# Patient Record
Sex: Female | Born: 1995 | Race: White | Hispanic: No | Marital: Single | Smoking: Never smoker
Health system: Southern US, Community
[De-identification: ages and names within clinical notes are randomized; demographics above are authoritative.]

## PROBLEM LIST (undated history)

## (undated) DIAGNOSIS — S83242A Other tear of medial meniscus, current injury, left knee, initial encounter: Secondary | ICD-10-CM

## (undated) DIAGNOSIS — K8050 Calculus of bile duct without cholangitis or cholecystitis without obstruction: Secondary | ICD-10-CM

## (undated) DIAGNOSIS — I10 Essential (primary) hypertension: Secondary | ICD-10-CM

## (undated) DIAGNOSIS — F32A Depression, unspecified: Secondary | ICD-10-CM

## (undated) DIAGNOSIS — S83282A Other tear of lateral meniscus, current injury, left knee, initial encounter: Secondary | ICD-10-CM

## (undated) DIAGNOSIS — N200 Calculus of kidney: Secondary | ICD-10-CM

## (undated) DIAGNOSIS — F329 Major depressive disorder, single episode, unspecified: Secondary | ICD-10-CM

## (undated) DIAGNOSIS — S060X9A Concussion with loss of consciousness of unspecified duration, initial encounter: Secondary | ICD-10-CM

## (undated) HISTORY — PX: TONSILLECTOMY: SUR1361

---
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
## 2014-01-31 ENCOUNTER — Encounter (HOSPITAL_BASED_OUTPATIENT_CLINIC_OR_DEPARTMENT_OTHER): Payer: Self-pay | Admitting: *Deleted

## 2014-02-01 ENCOUNTER — Other Ambulatory Visit: Payer: Self-pay | Admitting: Orthopedic Surgery

## 2014-02-02 ENCOUNTER — Encounter (HOSPITAL_BASED_OUTPATIENT_CLINIC_OR_DEPARTMENT_OTHER): Payer: BC Managed Care – PPO | Admitting: Anesthesiology

## 2014-02-02 ENCOUNTER — Ambulatory Visit (HOSPITAL_BASED_OUTPATIENT_CLINIC_OR_DEPARTMENT_OTHER)
Admission: RE | Admit: 2014-02-02 | Discharge: 2014-02-02 | Disposition: A | Discharge status: Home / Self Care | Payer: BC Managed Care – PPO | Source: Ambulatory Visit | Attending: Orthopedic Surgery | Admitting: Orthopedic Surgery

## 2014-02-02 ENCOUNTER — Ambulatory Visit (HOSPITAL_BASED_OUTPATIENT_CLINIC_OR_DEPARTMENT_OTHER): Payer: BC Managed Care – PPO | Admitting: Anesthesiology

## 2014-02-02 ENCOUNTER — Encounter (HOSPITAL_BASED_OUTPATIENT_CLINIC_OR_DEPARTMENT_OTHER): Payer: Self-pay | Admitting: *Deleted

## 2014-02-02 ENCOUNTER — Encounter (HOSPITAL_BASED_OUTPATIENT_CLINIC_OR_DEPARTMENT_OTHER)
Admission: RE | Disposition: A | Discharge status: Home / Self Care | Payer: Self-pay | Source: Ambulatory Visit | Attending: Orthopedic Surgery

## 2014-02-02 DIAGNOSIS — S83289A Other tear of lateral meniscus, current injury, unspecified knee, initial encounter: Secondary | ICD-10-CM | POA: Diagnosis present

## 2014-02-02 DIAGNOSIS — Y929 Unspecified place or not applicable: Secondary | ICD-10-CM | POA: Diagnosis not present

## 2014-02-02 DIAGNOSIS — S83282A Other tear of lateral meniscus, current injury, left knee, initial encounter: Secondary | ICD-10-CM | POA: Diagnosis present

## 2014-02-02 DIAGNOSIS — X58XXXA Exposure to other specified factors, initial encounter: Secondary | ICD-10-CM | POA: Diagnosis not present

## 2014-02-02 HISTORY — DX: Other tear of medial meniscus, current injury, left knee, initial encounter: S83.242A

## 2014-02-02 HISTORY — PX: KNEE ARTHROSCOPY WITH LATERAL MENISECTOMY: SHX6193

## 2014-02-02 HISTORY — DX: Concussion with loss of consciousness of unspecified duration, initial encounter: S06.0X9A

## 2014-02-02 HISTORY — DX: Other tear of lateral meniscus, current injury, left knee, initial encounter: S83.282A

## 2014-02-02 LAB — POCT HEMOGLOBIN-HEMACUE: HEMOGLOBIN: 13 g/dL (ref 12.0–15.0)

## 2014-02-02 SURGERY — ARTHROSCOPY, KNEE, WITH LATERAL MENISCECTOMY
Anesthesia: General | Site: Knee | Laterality: Left

## 2014-02-02 MED ORDER — ONDANSETRON HCL 4 MG PO TABS: 4.0000 mg | ORAL_TABLET | Freq: Three times a day (TID) | ORAL | Status: DC | PRN

## 2014-02-02 MED ORDER — PROPOFOL 10 MG/ML IV BOLUS
INTRAVENOUS | Status: DC | PRN
  Administered 2014-02-02: 400 mg via INTRAVENOUS

## 2014-02-02 MED ORDER — BUPIVACAINE-EPINEPHRINE (PF) 0.5% -1:200000 IJ SOLN
INTRAMUSCULAR | Status: AC
  Filled 2014-02-02: qty 30

## 2014-02-02 MED ORDER — ONDANSETRON HCL 4 MG/2ML IJ SOLN
INTRAMUSCULAR | Status: AC
  Filled 2014-02-02: qty 2

## 2014-02-02 MED ORDER — SCOPOLAMINE 1 MG/3DAYS TD PT72
1.0000 | MEDICATED_PATCH | TRANSDERMAL | Status: DC
  Administered 2014-02-02: 1 via TRANSDERMAL
  Administered 2014-02-02: 1.5 mg via TRANSDERMAL

## 2014-02-02 MED ORDER — FENTANYL CITRATE 0.05 MG/ML IJ SOLN: 50.0000 ug | INTRAMUSCULAR | Status: DC | PRN

## 2014-02-02 MED ORDER — SCOPOLAMINE 1 MG/3DAYS TD PT72
MEDICATED_PATCH | TRANSDERMAL | Status: AC
Start: 2014-02-02 — End: 2014-02-02
  Filled 2014-02-02: qty 1

## 2014-02-02 MED ORDER — OXYCODONE-ACETAMINOPHEN 5-325 MG PO TABS: 1.0000 | ORAL_TABLET | Freq: Four times a day (QID) | ORAL | Status: DC | PRN

## 2014-02-02 MED ORDER — HYDROMORPHONE HCL PF 1 MG/ML IJ SOLN
INTRAMUSCULAR | Status: AC
  Filled 2014-02-02: qty 1

## 2014-02-02 MED ORDER — OXYCODONE HCL 5 MG PO TABS: 5.0000 mg | ORAL_TABLET | Freq: Once | ORAL | Status: DC | PRN

## 2014-02-02 MED ORDER — HYDROMORPHONE HCL PF 1 MG/ML IJ SOLN
0.2500 mg | INTRAMUSCULAR | Status: DC | PRN
  Administered 2014-02-02 (×2): 0.5 mg via INTRAVENOUS

## 2014-02-02 MED ORDER — SUFENTANIL CITRATE 50 MCG/ML IV SOLN
INTRAVENOUS | Status: AC
  Filled 2014-02-02: qty 1

## 2014-02-02 MED ORDER — DEXAMETHASONE SODIUM PHOSPHATE 4 MG/ML IJ SOLN
INTRAMUSCULAR | Status: DC | PRN
  Administered 2014-02-02: 10 mg via INTRAVENOUS

## 2014-02-02 MED ORDER — ONDANSETRON HCL 4 MG/2ML IJ SOLN
4.0000 mg | Freq: Once | INTRAMUSCULAR | Status: AC | PRN
  Administered 2014-02-02: 4 mg via INTRAVENOUS

## 2014-02-02 MED ORDER — LIDOCAINE HCL (CARDIAC) 20 MG/ML IV SOLN
INTRAVENOUS | Status: DC | PRN
  Administered 2014-02-02: 100 mg via INTRAVENOUS

## 2014-02-02 MED ORDER — SUFENTANIL CITRATE 50 MCG/ML IV SOLN
INTRAVENOUS | Status: DC | PRN
  Administered 2014-02-02: 5 ug via INTRAVENOUS
  Administered 2014-02-02: 10 ug via INTRAVENOUS

## 2014-02-02 MED ORDER — SUCCINYLCHOLINE CHLORIDE 20 MG/ML IJ SOLN
INTRAMUSCULAR | Status: AC
  Filled 2014-02-02: qty 1

## 2014-02-02 MED ORDER — SENNA-DOCUSATE SODIUM 8.6-50 MG PO TABS: 2.0000 | ORAL_TABLET | Freq: Every day | ORAL | Status: DC

## 2014-02-02 MED ORDER — KETOROLAC TROMETHAMINE 10 MG PO TABS: 10.0000 mg | ORAL_TABLET | Freq: Three times a day (TID) | ORAL | Status: DC

## 2014-02-02 MED ORDER — DEXTROSE 5 % IV SOLN: 3.0000 g | INTRAVENOUS | Status: DC

## 2014-02-02 MED ORDER — BUPIVACAINE HCL (PF) 0.5 % IJ SOLN
INTRAMUSCULAR | Status: DC | PRN
  Administered 2014-02-02: 20 mL

## 2014-02-02 MED ORDER — ONDANSETRON HCL 4 MG/2ML IJ SOLN
INTRAMUSCULAR | Status: DC | PRN
  Administered 2014-02-02: 4 mg via INTRAVENOUS

## 2014-02-02 MED ORDER — OXYCODONE HCL 5 MG/5ML PO SOLN: 5.0000 mg | Freq: Once | ORAL | Status: DC | PRN

## 2014-02-02 MED ORDER — MIDAZOLAM HCL 5 MG/5ML IJ SOLN
INTRAMUSCULAR | Status: DC | PRN
  Administered 2014-02-02: 2 mg via INTRAVENOUS

## 2014-02-02 MED ORDER — MIDAZOLAM HCL 2 MG/2ML IJ SOLN: 1.0000 mg | INTRAMUSCULAR | Status: DC | PRN

## 2014-02-02 MED ORDER — LACTATED RINGERS IV SOLN
INTRAVENOUS | Status: DC
  Administered 2014-02-02 (×2): via INTRAVENOUS

## 2014-02-02 MED ORDER — CEFAZOLIN SODIUM-DEXTROSE 2-3 GM-% IV SOLR
INTRAVENOUS | Status: AC
  Filled 2014-02-02: qty 50

## 2014-02-02 MED ORDER — CEFAZOLIN SODIUM 1-5 GM-% IV SOLN
INTRAVENOUS | Status: AC
  Filled 2014-02-02: qty 50

## 2014-02-02 MED ORDER — SODIUM CHLORIDE 0.9 % IR SOLN
Status: DC | PRN
  Administered 2014-02-02: 6000 mL

## 2014-02-02 MED ORDER — BUPIVACAINE HCL (PF) 0.5 % IJ SOLN
INTRAMUSCULAR | Status: AC
  Filled 2014-02-02: qty 30

## 2014-02-02 MED ORDER — MIDAZOLAM HCL 2 MG/2ML IJ SOLN
INTRAMUSCULAR | Status: AC
  Filled 2014-02-02: qty 2

## 2014-02-02 SURGICAL SUPPLY — 45 items
APL SKNCLS STERI-STRIP NONHPOA (GAUZE/BANDAGES/DRESSINGS) ×1
BANDAGE ELASTIC 6 VELCRO ST LF (GAUZE/BANDAGES/DRESSINGS) ×2 IMPLANT
BANDAGE ESMARK 6X9 LF (GAUZE/BANDAGES/DRESSINGS) IMPLANT
BENZOIN TINCTURE PRP APPL 2/3 (GAUZE/BANDAGES/DRESSINGS) ×2 IMPLANT
BLADE CUTTER GATOR 3.5 (BLADE) ×2 IMPLANT
BNDG CMPR 9X6 STRL LF SNTH (GAUZE/BANDAGES/DRESSINGS)
BNDG ESMARK 6X9 LF (GAUZE/BANDAGES/DRESSINGS)
CANISTER SUCT 3000ML (MISCELLANEOUS) IMPLANT
CUFF TOURNIQUET SINGLE 34IN LL (TOURNIQUET CUFF) IMPLANT
CUTTER KNOT PUSHER 2-0 FIBERWI (INSTRUMENTS) IMPLANT
CUTTER MENISCUS  4.2MM (BLADE)
CUTTER MENISCUS 4.2MM (BLADE) IMPLANT
DRAPE ARTHROSCOPY W/POUCH 90 (DRAPES) ×2 IMPLANT
DRAPE U 20/CS (DRAPES) ×2 IMPLANT
DURAPREP 26ML APPLICATOR (WOUND CARE) ×3 IMPLANT
ELECT MENISCUS 165MM 90D (ELECTRODE) IMPLANT
ELECT REM PT RETURN 9FT ADLT (ELECTROSURGICAL)
ELECTRODE REM PT RTRN 9FT ADLT (ELECTROSURGICAL) IMPLANT
GAUZE SPONGE 4X4 12PLY STRL (GAUZE/BANDAGES/DRESSINGS) ×2 IMPLANT
GLOVE BIO SURGEON STRL SZ8 (GLOVE) ×2 IMPLANT
GLOVE BIOGEL PI IND STRL 7.0 (GLOVE) IMPLANT
GLOVE BIOGEL PI IND STRL 8 (GLOVE) ×2 IMPLANT
GLOVE BIOGEL PI INDICATOR 7.0 (GLOVE) ×1
GLOVE BIOGEL PI INDICATOR 8 (GLOVE) ×2
GLOVE ECLIPSE 6.5 STRL STRAW (GLOVE) ×1 IMPLANT
GLOVE ORTHO TXT STRL SZ7.5 (GLOVE) ×2 IMPLANT
GOWN STRL REUS W/ TWL LRG LVL3 (GOWN DISPOSABLE) ×1 IMPLANT
GOWN STRL REUS W/ TWL XL LVL3 (GOWN DISPOSABLE) ×2 IMPLANT
GOWN STRL REUS W/TWL LRG LVL3 (GOWN DISPOSABLE) ×2
GOWN STRL REUS W/TWL XL LVL3 (GOWN DISPOSABLE) ×4
IV NS IRRIG 3000ML ARTHROMATIC (IV SOLUTION) ×4 IMPLANT
KNEE WRAP E Z 3 GEL PACK (MISCELLANEOUS) ×2 IMPLANT
MANIFOLD NEPTUNE II (INSTRUMENTS) ×2 IMPLANT
PACK ARTHROSCOPY DSU (CUSTOM PROCEDURE TRAY) ×2 IMPLANT
PACK BASIN DAY SURGERY FS (CUSTOM PROCEDURE TRAY) ×2 IMPLANT
PENCIL BUTTON HOLSTER BLD 10FT (ELECTRODE) IMPLANT
SET ARTHROSCOPY TUBING (MISCELLANEOUS) ×2
SET ARTHROSCOPY TUBING LN (MISCELLANEOUS) ×1 IMPLANT
SLEEVE SCD COMPRESS KNEE MED (MISCELLANEOUS) ×2 IMPLANT
STRIP CLOSURE SKIN 1/2X4 (GAUZE/BANDAGES/DRESSINGS) ×2 IMPLANT
SUT MNCRL AB 4-0 PS2 18 (SUTURE) ×2 IMPLANT
TOWEL OR 17X24 6PK STRL BLUE (TOWEL DISPOSABLE) ×2 IMPLANT
TOWEL OR NON WOVEN STRL DISP B (DISPOSABLE) ×2 IMPLANT
WAND STAR VAC 90 (SURGICAL WAND) IMPLANT
WATER STERILE IRR 1000ML POUR (IV SOLUTION) ×2 IMPLANT

## 2014-02-02 NOTE — CV Procedure (Signed)
02/02/2014  10:24 AM  PATIENT:  Rebecca Gardner    PRE-OPERATIVE DIAGNOSIS:  Left knee complex lateral meniscus tear  POST-OPERATIVE DIAGNOSIS:  Same  PROCEDURE:  LEFT KNEE ARTHROSCOPY WITH LATERAL MENISECTOMY  SURGEON:  Eulas Post, MD  PHYSICIAN ASSISTANT: Janace Litten, OPA-C, present and scrubbed throughout the case, critical for completion in a timely fashion, and for retraction, instrumentation, and closure.  ANESTHESIA:   General  PREOPERATIVE INDICATIONS:  Rebecca Gardner is a  18 y.o. female with a diagnosis of Left Knee: Tear Meniscus Lateral/Anthorn/Post Margaretmary Eddy who failed conservative measures and elected for surgical management.    The risks benefits and alternatives were discussed with the patient preoperatively including but not limited to the risks of infection, bleeding, nerve injury, cardiopulmonary complications, the need for revision surgery, among others, and the patient was willing to proceed.  OPERATIVE IMPLANTS: None  OPERATIVE FINDINGS: The anterior cruciate ligament and PCL the medial compartment and patellofemoral joint were all completely normal. The lateral meniscus had a complex tear involving the anterior horn extending around to the mid substance of the body. This was a cleavage-type tear, along with a radial component, leaving a large anterior stump that was flipped up into the notch, and the lateral compartment chondral integrity appeared intact.  OPERATIVE PROCEDURE: The patient is brought to the operating room and placed in supine position. General anesthesia was administered. IV antibiotics were given. The left lower extremity was prepped and draped in usual sterile fashion. Time out was performed. Inferomedial and inferolateral portals created, and diagnostic arthroscopy carried out the above-named findings.  I then used the arthroscopic shaver and the arthroscopic biter to debride the lateral meniscus back to a stable configuration. I removed a large  segment of the anterior horn using a grasper. I switched portals and confirmed appropriate contour of the meniscus, and then removed the instruments, and drained the knee and injected the knee with Marcaine and then closed the portals with Monocryl with Steri-Strips and sterile gauze. She was awakened and returned to the PACU in stable and satisfactory condition. There were no complications. \

## 2014-02-02 NOTE — H&P (Signed)
PREOPERATIVE H&P  Chief Complaint: Left Knee: Tear Meniscus Lateral/Anthorn/Post Margaretmary Eddy  HPI: Rebecca Gardner is a 18 y.o. female who presents for preoperative history and physical with a diagnosis of Left Knee: Tear Meniscus Lateral/Anthorn/Post Horn. Symptoms are rated as moderate to severe, and have been worsening.  This is significantly impairing activities of daily living.  She has elected for surgical management. She plays volleyball and has had locking and catching and swelling.   Past Medical History  Diagnosis Date  . Acute medial meniscus tear of left knee   . Concussion with brief (less than one hour) loss of consciousness 4-28 15   Past Surgical History  Procedure Laterality Date  . Tonsillectomy     History   Social History  . Marital Status: Single    Spouse Name: N/A    Number of Children: N/A  . Years of Education: N/A   Social History Main Topics  . Smoking status: Never Smoker   . Smokeless tobacco: None  . Alcohol Use: No  . Drug Use: No  . Sexual Activity: No   Other Topics Concern  . None   Social History Narrative  . None   History reviewed. No pertinent family history. No Known Allergies Prior to Admission medications   Not on File     Positive ROS: All other systems have been reviewed and were otherwise negative with the exception of those mentioned in the HPI and as above.  Physical Exam: General: Alert, no acute distress Cardiovascular: No pedal edema Respiratory: No cyanosis, no use of accessory musculature GI: No organomegaly, abdomen is soft and non-tender Skin: No lesions in the area of chief complaint Neurologic: Sensation intact distally Psychiatric: Patient is competent for consent with normal mood and affect Lymphatic: No axillary or cervical lymphadenopathy  MUSCULOSKELETAL: left knee with lateral joint line tenderness, positive pain to palpation  Assessment: Left Knee: Tear Meniscus Lateral/Anthorn/Post Horn  Plan: Plan  for Procedure(s): LEFT KNEE ARTHROSCOPY WITH LATERAL MENISECTOMY, LATERAL MENISCUS REPAIR  The risks benefits and alternatives were discussed with the patient including but not limited to the risks of nonoperative treatment, versus surgical intervention including infection, bleeding, nerve injury,  blood clots, cardiopulmonary complications, morbidity, mortality, among others, and they were willing to proceed.   Eulas Post, MD Cell [phone]   02/02/2014 9:09 AM

## 2014-02-02 NOTE — Anesthesia Procedure Notes (Signed)
Procedure Name: LMA Insertion Date/Time: 02/02/2014 9:33 AM Performed by: Zenia Resides D Pre-anesthesia Checklist: Patient identified, Emergency Drugs available, Suction available and Patient being monitored Patient Re-evaluated:Patient Re-evaluated prior to inductionOxygen Delivery Method: Circle System Utilized Preoxygenation: Pre-oxygenation with 100% oxygen Intubation Type: IV induction Ventilation: Mask ventilation without difficulty LMA: LMA inserted LMA Size: 4.0 Number of attempts: 1 Airway Equipment and Method: bite block Placement Confirmation: positive ETCO2 Tube secured with: Tape Dental Injury: Teeth and Oropharynx as per pre-operative assessment

## 2014-02-02 NOTE — Discharge Instructions (Signed)
Diet: As you were doing prior to hospitalization   Shower:  May shower but keep the wounds dry, use an occlusive plastic wrap, NO SOAKING IN TUB.  If the bandage gets wet, change with a clean dry gauze.  Dressing:  You may change your dressing 3-5 days after surgery.  Then change the dressing daily with sterile gauze dressing.    There are sticky tapes (steri-strips) on your wounds and all the stitches are absorbable.  Leave the steri-strips in place when changing your dressings, they will peel off with time, usually 2-3 weeks.  Activity:  Increase activity slowly as tolerated, but follow the weight bearing instructions below.  No lifting or driving for 6 weeks.  Weight Bearing:   As tolerated.    To prevent constipation: you may use a stool softener such as -  Colace (over the counter) 100 mg by mouth twice a day  Drink plenty of fluids (prune juice may be helpful) and high fiber foods Miralax (over the counter) for constipation as needed.    Itching:  If you experience itching with your medications, try taking only a single pain pill, or even half a pain pill at a time.  You may take up to 10 pain pills per day, and you can also use benadryl over the counter for itching or also to help with sleep.   Precautions:  If you experience chest pain or shortness of breath - call 911 immediately for transfer to the hospital emergency department!!  If you develop a fever greater that 101 F, purulent drainage from wound, increased redness or drainage from wound, or calf pain -- Call the office at [phone]                                                Follow- Up Appointment:  Please call for an appointment to be seen in 2 weeks [place] - [phone]      Post Anesthesia Home Care Instructions  Activity: Get plenty of rest for the remainder of the day. A responsible adult should stay with you for 24 hours following the procedure.  For the next 24 hours, DO NOT: -Drive a car -Social worker -Drink alcoholic beverages -Take any medication unless instructed by your physician -Make any legal decisions or sign important papers.  Meals: Start with liquid foods such as gelatin or soup. Progress to regular foods as tolerated. Avoid greasy, spicy, heavy foods. If nausea and/or vomiting occur, drink only clear liquids until the nausea and/or vomiting subsides. Call your physician if vomiting continues.  Special Instructions/Symptoms: Your throat may feel dry or sore from the anesthesia or the breathing tube placed in your throat during surgery. If this causes discomfort, gargle with warm salt water. The discomfort should disappear within 24 hours.  Cast or Splint Care Casts and splints support injured limbs and keep bones from moving while they heal. It is important to care for your cast or splint at home.  HOME CARE INSTRUCTIONS  Keep the cast or splint uncovered during the drying period. It can take 24 to 48 hours to dry if it is made of plaster. A fiberglass cast will dry in less than 1 hour.  Do not rest the cast on anything harder than a pillow for the first 24 hours.  Do not put weight on your injured limb or apply  pressure to the cast until your health care provider gives you permission.  Keep the cast or splint dry. Wet casts or splints can lose their shape and may not support the limb as well. A wet cast that has lost its shape can also create harmful pressure on your skin when it dries. Also, wet skin can become infected.  Cover the cast or splint with a plastic bag when bathing or when out in the rain or snow. If the cast is on the trunk of the body, take sponge baths until the cast is removed.  If your cast does become wet, dry it with a towel or a blow dryer on the cool setting only.  Keep your cast or splint clean. Soiled casts may be wiped with a moistened cloth.  Do not place any hard or soft foreign objects under your cast or splint, such as cotton,  toilet paper, lotion, or powder.  Do not try to scratch the skin under the cast with any object. The object could get stuck inside the cast. Also, scratching could lead to an infection. If itching is a problem, use a blow dryer on a cool setting to relieve discomfort.  Do not trim or cut your cast or remove padding from inside of it.  Exercise all joints next to the injury that are not immobilized by the cast or splint. For example, if you have a long leg cast, exercise the hip joint and toes. If you have an arm cast or splint, exercise the shoulder, elbow, thumb, and fingers.  Elevate your injured arm or leg on 1 or 2 pillows for the first 1 to 3 days to decrease swelling and pain.It is best if you can comfortably elevate your cast so it is higher than your heart. SEEK MEDICAL CARE IF:   Your cast or splint cracks.  Your cast or splint is too tight or too loose.  You have unbearable itching inside the cast.  Your cast becomes wet or develops a soft spot or area.  You have a bad smell coming from inside your cast.  You get an object stuck under your cast.  Your skin around the cast becomes red or raw.  You have new pain or worsening pain after the cast has been applied. SEEK IMMEDIATE MEDICAL CARE IF:   You have fluid leaking through the cast.  You are unable to move your fingers or toes.  You have discolored (blue or white), cool, painful, or very swollen fingers or toes beyond the cast.  You have tingling or numbness around the injured area.  You have severe pain or pressure under the cast.  You have any difficulty with your breathing or have shortness of breath.  You have chest pain. Document Released: 05/22/2000 Document Revised: 03/15/2013 Document Reviewed: 12/01/2012 [facility] Patient Information 2015 [place], [place]. This information is not intended to replace advice given to you by your health care provider. Make sure you discuss any questions you have with your  health care provider.

## 2014-02-02 NOTE — Transfer of Care (Signed)
Immediate Anesthesia Transfer of Care Note  Patient: Rebecca Gardner  Procedure(s) Performed: Procedure(s): LEFT KNEE ARTHROSCOPY WITH LATERAL MENISECTOMY (Left)  Patient Location: PACU  Anesthesia Type:General  Level of Consciousness: awake, alert  and oriented  Airway & Oxygen Therapy: Patient Spontanous Breathing and Patient connected to face mask oxygen  Post-op Assessment: Report given to PACU RN and Post -op Vital signs reviewed and stable  Post vital signs: Reviewed and stable  Complications: No apparent anesthesia complications

## 2014-02-02 NOTE — Anesthesia Preprocedure Evaluation (Addendum)
Anesthesia Evaluation  Patient identified by MRN, date of birth, ID band Patient awake    Reviewed: Allergy & Precautions, H&P , NPO status , Patient's Chart, lab work & pertinent test results  Airway Mallampati: I  TM Distance: >3 FB Neck ROM: Full    Dental  (+) Teeth Intact, Dental Advisory Given   Pulmonary  breath sounds clear to auscultation        Cardiovascular Rhythm:Regular Rate:Normal     Neuro/Psych    GI/Hepatic   Endo/Other  Morbid obesity  Renal/GU      Musculoskeletal   Abdominal   Peds  Hematology   Anesthesia Other Findings   Reproductive/Obstetrics                             Anesthesia Physical Anesthesia Plan  ASA: II  Anesthesia Plan: General   Post-op Pain Management:    Induction: Intravenous  Airway Management Planned: LMA  Additional Equipment:   Intra-op Plan:   Post-operative Plan: Extubation in OR  Informed Consent: I have reviewed the patients History and Physical, chart, labs and discussed the procedure including the risks, benefits and alternatives for the proposed anesthesia with the patient or authorized representative who has indicated his/her understanding and acceptance.   Dental advisory given  Plan Discussed with: CRNA, Anesthesiologist and Surgeon  Anesthesia Plan Comments:         Anesthesia Quick Evaluation  

## 2014-02-02 NOTE — Anesthesia Postprocedure Evaluation (Signed)
  Anesthesia Post-op Note  Patient: Rebecca Gardner  Procedure(s) Performed: Procedure(s): LEFT KNEE ARTHROSCOPY WITH LATERAL MENISECTOMY (Left)  Patient Location: PACU  Anesthesia Type: General   Level of Consciousness: awake, alert  and oriented  Airway and Oxygen Therapy: Patient Spontanous Breathing  Post-op Pain: mild  Post-op Assessment: Post-op Vital signs reviewed  Post-op Vital Signs: Reviewed  Last Vitals:  Filed Vitals:   02/02/14 1115  BP: 127/76  Pulse: 81  Temp:   Resp: 14    Complications: No apparent anesthesia complications

## 2014-02-05 ENCOUNTER — Encounter (HOSPITAL_BASED_OUTPATIENT_CLINIC_OR_DEPARTMENT_OTHER): Payer: Self-pay | Admitting: Orthopedic Surgery

## 2014-07-18 ENCOUNTER — Telehealth: Payer: Self-pay | Admitting: Nurse Practitioner

## 2014-07-18 NOTE — Telephone Encounter (Signed)
Patient already has appointment.

## 2014-11-29 ENCOUNTER — Ambulatory Visit (INDEPENDENT_AMBULATORY_CARE_PROVIDER_SITE_OTHER): Payer: BLUE CROSS/BLUE SHIELD

## 2014-11-29 ENCOUNTER — Ambulatory Visit (INDEPENDENT_AMBULATORY_CARE_PROVIDER_SITE_OTHER): Payer: BLUE CROSS/BLUE SHIELD | Admitting: Physician Assistant

## 2014-11-29 ENCOUNTER — Encounter: Payer: Self-pay | Admitting: Physician Assistant

## 2014-11-29 VITALS — BP 133/75 | HR 93 | Temp 97.9°F | Ht 69.0 in | Wt 307.0 lb

## 2014-11-29 DIAGNOSIS — S99911A Unspecified injury of right ankle, initial encounter: Secondary | ICD-10-CM | POA: Diagnosis not present

## 2014-11-29 DIAGNOSIS — S93401A Sprain of unspecified ligament of right ankle, initial encounter: Secondary | ICD-10-CM | POA: Diagnosis not present

## 2014-11-29 MED ORDER — NAPROXEN 500 MG PO TABS: 500.0000 mg | ORAL_TABLET | Freq: Two times a day (BID) | ORAL | Status: DC

## 2014-11-29 MED ORDER — HYDROCODONE-ACETAMINOPHEN 5-325 MG PO TABS: 1.0000 | ORAL_TABLET | Freq: Four times a day (QID) | ORAL | Status: DC | PRN

## 2014-11-29 NOTE — Progress Notes (Signed)
   Subjective:    Patient ID: Rebecca Gardner, female    DOB: [birth year], 19 y.o.   MRN: [id number]  HPI 19 y/o female presents with c/o right ankle pain s/p fall after stepping on the base wrong and twisting her foot yesterday in foot inversion position. She felt immediate pain and had swelling immediately after the injury. She has applied ice, and has taken ibuprofen 400mg  last night. No prior injury. Has been able to ambulate but has a brace on it, which helps.    Review of Systems  Musculoskeletal: Positive for joint swelling and arthralgias.       Swelling on lateral ankle with bruising, painful  Difficulty with ambulation   Skin: Positive for color change.       Objective:   Physical Exam  Constitutional: She is oriented to person, place, and time.  Xray negative for fracture or dislocation   Musculoskeletal: She exhibits edema (more prominent on lateral malleolus ) and tenderness (left lateral malleolus).  Full AROM on plantar flexion, extension, inversion, eversion   Neurological: She is alert and oriented to person, place, and time.  Neurological and vascular status intact   Psychiatric: She has a normal mood and affect. Her behavior is normal. Judgment and thought content normal.  Nursing note and vitals reviewed.         Assessment & Plan:  1. Ankle injury, right, initial encounter  - DG Ankle Complete Right; Future - HYDROcodone-acetaminophen (NORCO) 5-325 MG per tablet; Take 1 tablet by mouth every 6 (six) hours as needed for moderate pain.  Dispense: 30 tablet; Refill: 0 - naproxen (NAPROSYN) 500 MG tablet; Take 1 tablet (500 mg total) by mouth 2 (two) times daily with a meal.  Dispense: 30 tablet; Refill: 0  2. Right ankle sprain, initial encounter  - HYDROcodone-acetaminophen (NORCO) 5-325 MG per tablet; Take 1 tablet by mouth every 6 (six) hours as needed for moderate pain.  Dispense: 30 tablet; Refill: 0 - naproxen (NAPROSYN) 500 MG tablet; Take 1 tablet  (500 mg total) by mouth 2 (two) times daily with a meal.  Dispense: 30 tablet; Refill: 0   Crutches for ambulation as needed  RTO 1 week   Shanece Cochrane A. Chauncey Reading PA-C

## 2014-11-29 NOTE — Patient Instructions (Signed)

## 2014-12-06 ENCOUNTER — Encounter: Payer: Self-pay | Admitting: Physician Assistant

## 2014-12-06 ENCOUNTER — Ambulatory Visit (INDEPENDENT_AMBULATORY_CARE_PROVIDER_SITE_OTHER): Payer: BLUE CROSS/BLUE SHIELD | Admitting: Physician Assistant

## 2014-12-06 VITALS — BP 155/104 | HR 106 | Temp 97.6°F | Ht 69.0 in | Wt 310.0 lb

## 2014-12-06 DIAGNOSIS — S93401D Sprain of unspecified ligament of right ankle, subsequent encounter: Secondary | ICD-10-CM | POA: Diagnosis not present

## 2014-12-06 DIAGNOSIS — IMO0001 Reserved for inherently not codable concepts without codable children: Secondary | ICD-10-CM

## 2014-12-06 DIAGNOSIS — R030 Elevated blood-pressure reading, without diagnosis of hypertension: Secondary | ICD-10-CM | POA: Diagnosis not present

## 2014-12-06 NOTE — Progress Notes (Signed)
   Subjective:    Patient ID: Rebecca Gardner, female    DOB: 10-02-1995, 19 y.o.   MRN: [id number]  HPI 19 y/o female presents for follow up of right ankle sprain s/p falling while playing softball. She states that her pain is much improved. She has been taking Norco and Mobic with relief.   She is hypertensive today but states that she checks her bp at home and this is unusual for her.    Review of Systems  Musculoskeletal:       Intermittent edema with long term standing   Skin: Positive for color change (still has some bruising ).       Objective:   Physical Exam  Constitutional: She is oriented to person, place, and time. She appears well-developed and well-nourished. No distress.  Cardiovascular:  Hypertensive    Abdominal:  Obese    Musculoskeletal: Normal range of motion. She exhibits edema and tenderness (lateral malleolus ).  Neurological: She is alert and oriented to person, place, and time.  Skin: She is not diaphoretic.  Mild ecchymosis on 3,4 digits   Psychiatric: She has a normal mood and affect. Her behavior is normal. Judgment and thought content normal.  Nursing note and vitals reviewed.         Assessment & Plan:  1. Right ankle sprain, subsequent encounter - continue to wear brace when standing/walking x 4 weeks to prevent additional sprains - Continue mobic for decreased inflammation as needed  2. Elevated BP - BP sheet given to record readings am/pm. Advised patient to rtc if BP readings are often above 140/90.    RTO prn   Rebecca Gardner A. Rebecca ReadingGann PA-C

## 2015-07-25 ENCOUNTER — Encounter: Payer: Self-pay | Admitting: Pediatrics

## 2015-07-25 ENCOUNTER — Ambulatory Visit (INDEPENDENT_AMBULATORY_CARE_PROVIDER_SITE_OTHER): Payer: BLUE CROSS/BLUE SHIELD | Admitting: Pediatrics

## 2015-07-25 VITALS — BP 126/80 | HR 90 | Temp 97.5°F | Ht 69.0 in | Wt 316.6 lb

## 2015-07-25 DIAGNOSIS — B372 Candidiasis of skin and nail: Secondary | ICD-10-CM

## 2015-07-25 DIAGNOSIS — L0292 Furuncle, unspecified: Secondary | ICD-10-CM | POA: Diagnosis not present

## 2015-07-25 DIAGNOSIS — Z309 Encounter for contraceptive management, unspecified: Secondary | ICD-10-CM

## 2015-07-25 MED ORDER — FLUCONAZOLE 150 MG PO TABS: 150.0000 mg | ORAL_TABLET | ORAL | Status: DC | PRN

## 2015-07-25 MED ORDER — MUPIROCIN 2 % EX OINT
TOPICAL_OINTMENT | CUTANEOUS | Status: DC
Start: 2015-07-25 — End: 2016-07-06

## 2015-07-25 MED ORDER — NYSTATIN 100000 UNIT/GM EX POWD: CUTANEOUS | Status: DC

## 2015-07-25 MED ORDER — NYSTATIN 100000 UNIT/GM EX OINT: 1.0000 "application " | TOPICAL_OINTMENT | Freq: Two times a day (BID) | CUTANEOUS | Status: DC

## 2015-07-25 NOTE — Patient Instructions (Signed)
Use warm compresses and mupirocin antibiotic ointment when areas start draining  Nystatin powder to help when red areas moist  Take diflucan tab once every 72h as needed for redness and irritation below breasts  Let me know if not improving

## 2015-07-25 NOTE — Progress Notes (Signed)
Subjective:    Patient ID: Rebecca Gardner, female    DOB: Apr 26, 1996, 20 y.o.   MRN: [id number]  CC: lumps on breast   HPI: Rebecca Gardner is a 20 y.o. female presenting for lumps on breast  For past couple of months has had multiple (2-3 each side) draining red skin bumps on each breast. At first came and went with periods. Sometimes they drain.  No other areas of body with the boils. No fevers Breast tissue itself is not tender.  Does get a rash below both breasts b/l Not using anything on the boils or rash below breasts  Periods irregular, started in 8th grade, last 2-3, up to 10 days, gets period apprx every month, but not regularly  Has taken hormonal OCP in the past  PGM--had breast ca   Depression screen [facility] 2/9 07/25/2015  Decreased Interest 0  Down, Depressed, Hopeless 0  PHQ - 2 Score 0     Relevant past medical, surgical, family and social history reviewed and updated as indicated. Interim medical history since our last visit reviewed. Allergies and medications reviewed and updated.    ROS: Per HPI unless specifically indicated above  History  Smoking status  . Never Smoker   Smokeless tobacco  . Not on file    Past Medical History Patient Active Problem List   Diagnosis Date Noted  . Acute lateral meniscus tear of left knee 02/02/2014    Current Outpatient Prescriptions  Medication Sig Dispense Refill  . fluconazole (DIFLUCAN) 150 MG tablet Take 1 tablet (150 mg total) by mouth every three (3) days as needed. 3 tablet 0  . mupirocin ointment (BACTROBAN) 2 % Apply twice daily to draining areas 30 g 1  . nystatin (MYCOSTATIN/NYSTOP) 100000 UNIT/GM POWD Apply twice daily when redness increases 60 g 0  . nystatin ointment (MYCOSTATIN) Apply 1 application topically 2 (two) times daily. 30 g 1   No current facility-administered medications for this visit.       Objective:    BP 126/80 mmHg  Pulse 90  Temp(Src) 97.5 F (36.4 C) (Oral)  Ht   (1.753 m)  Wt 316 lb 9.6 oz (143.609 kg)  BMI 46.73 kg/m2  Wt Readings from Last 3 Encounters:  07/25/15 316 lb 9.6 oz (143.609 kg)  12/06/14 310 lb (140.615 kg) (100 %*, Z = 2.88)  11/29/14 307 lb (139.254 kg) (100 %*, Z = 2.86)   * Growth percentiles are based on CDC 2-20 Years data.     Gen: NAD, alert, cooperative with exam, NCAT EYES: EOMI, no scleral injection or icterus ENT:   OP without erythema LYMPH: no cervical LAD CV: NRRR, normal S1/S2, no murmur, distal pulses 2+ b/l Resp: CTABL, no wheezes, normal WOB Abd: +BS, soft, NTND. no guarding or organomegaly Ext: No edema, warm Neuro: Alert and oriented Breast: b/l breasts with superficial red raised papules, not actively draining, no surrounding erythema or induration. Below breasts b/l with red raised plaque with some red satellite lesions consistent with intertrigal candidiasis. Normal breast tissue exam b/l.      Assessment & Plan:    Rebecca Gardner was seen today for lumps on breast.  Diagnoses and all orders for this visit:  Boil All actively healing now, no induration or cellulitis. Discussed warm compresses, bactroban use. -     mupirocin ointment (BACTROBAN) 2 %; Apply twice daily to draining areas  Encounter for contraceptive management, unspecified encounter Discussed BC options, pt would like IUD  placed likely vs nexplanon. -     Ambulatory referral to Gynecology  Candidal intertrigo -     fluconazole (DIFLUCAN) 150 MG tablet; Take 1 tablet (150 mg total) by mouth every three (3) days as needed. -     nystatin (MYCOSTATIN/NYSTOP) 100000 UNIT/GM POWD; Apply twice daily when redness increases -     nystatin ointment (MYCOSTATIN); Apply 1 application topically 2 (two) times daily.    Follow up plan: Return in about 6 months (around 01/22/2016). sooner if above not improving  Rex Kras, MD [facility] 07/25/2015, 8:44 AM

## 2015-07-30 ENCOUNTER — Encounter: Payer: Self-pay | Admitting: Obstetrics & Gynecology

## 2015-07-30 ENCOUNTER — Ambulatory Visit (INDEPENDENT_AMBULATORY_CARE_PROVIDER_SITE_OTHER): Payer: BLUE CROSS/BLUE SHIELD | Admitting: Obstetrics & Gynecology

## 2015-07-30 VITALS — BP 134/86 | HR 76 | Resp 16 | Ht 68.5 in | Wt 314.0 lb

## 2015-07-30 DIAGNOSIS — N926 Irregular menstruation, unspecified: Secondary | ICD-10-CM

## 2015-07-30 DIAGNOSIS — Z30430 Encounter for insertion of intrauterine contraceptive device: Secondary | ICD-10-CM

## 2015-07-30 DIAGNOSIS — Z975 Presence of (intrauterine) contraceptive device: Secondary | ICD-10-CM

## 2015-07-30 DIAGNOSIS — E669 Obesity, unspecified: Secondary | ICD-10-CM | POA: Diagnosis not present

## 2015-07-30 LAB — POCT URINE PREGNANCY: Preg Test, Ur: NEGATIVE

## 2015-07-30 NOTE — Progress Notes (Signed)
20 y.o. G0P0000 SingleCaucasianF here for IUD consultation.  Not SA but has been in the past.  Last encounter was late October.  Cycles are irregular.  She can have only two weeks between or up to seven or eight weeks.  Flow can last from 3 to 10.  Pt would like something for contraception and for possible improvement in cycles.  She has considered options and is not interested in Depo Provera, Nexplanon, and OCPs.  Would like IUD placement if possible.  Risks for placement and side effects reviewed.  Pt would like five year method if possible.  Patient's last menstrual period was 07/26/2015.          Sexually active: No.  The current method of family planning is abstinence.    Exercising: Yes.    Walking, running Smoker:  no  Health Maintenance: Pap: Never MMG:  Never TDaP: Spring 2015 Gardasil: done 2009 Screening Labs: PCP, Hb today: PCP, Urine today: PCP   reports that she has never smoked. She has never used smokeless tobacco. She reports that she does not drink alcohol or use illicit drugs.  Past Medical History  Diagnosis Date  . Acute medial meniscus tear of left knee   . Concussion with brief (less than one hour) loss of consciousness 4-28 15  . Acute lateral meniscus tear of left knee 02/02/2014    Past Surgical History  Procedure Laterality Date  . Tonsillectomy    . Knee arthroscopy with lateral menisectomy Left 02/02/2014    Procedure: LEFT KNEE ARTHROSCOPY WITH LATERAL MENISECTOMY;  Surgeon: Eulas Post, MD;  Location: [facility];  Service: Orthopedics;  Laterality: Left;    Current Outpatient Prescriptions  Medication Sig Dispense Refill  . fluconazole (DIFLUCAN) 150 MG tablet Take 1 tablet (150 mg total) by mouth every three (3) days as needed. 3 tablet 0  . mupirocin ointment (BACTROBAN) 2 % Apply twice daily to draining areas 30 g 1  . nystatin (MYCOSTATIN/NYSTOP) 100000 UNIT/GM POWD Apply twice daily when redness increases 60 g 0  . nystatin  ointment (MYCOSTATIN) Apply 1 application topically 2 (two) times daily. 30 g 1   No current facility-administered medications for this visit.    Family History  Problem Relation Age of Onset  . Breast cancer Paternal Grandmother   . Atrial fibrillation Father     ROS:  Pertinent items are noted in HPI.  Otherwise, a comprehensive ROS was negative.  Exam:   BP 134/86 mmHg  Pulse 76  Resp 16  Ht 5' 8.5" (1.74 m)  Wt 314 lb (142.429 kg)  BMI 47.04 kg/m2  LMP 07/26/2015  Height: 5' 8.5" (174 cm)  Ht Readings from Last 3 Encounters:  07/30/15 5' 8.5" (1.74 m)  07/25/15  (1.753 m)  12/06/14  (1.753 m) (97 %*, Z = 1.85)   * Growth percentiles are based on CDC 2-20 Years data.    General appearance: alert, cooperative and appears stated age Head: Normocephalic, without obvious abnormality, atraumatic Abdomen: soft, non-tender; bowel sounds normal; no masses,  no organomegaly Extremities: extremities normal, atraumatic, no cyanosis or edema Skin: Skin color, texture, turgor normal. No rashes or lesions Lymph nodes: Cervical, supraclavicular, and axillary nodes normal. No abnormal inguinal nodes palpated Neurologic: Grossly normal   Pelvic: External genitalia:  no lesions              Urethra:  normal appearing urethra with no masses, tenderness or lesions  Bartholins and Skenes: normal                 Vagina: normal appearing vagina with normal color and discharge, no lesions              Cervix: no lesions              Pap taken: No. Bimanual Exam:  Uterus:  normal size, contour, position, consistency, mobility, non-tender              Adnexa: normal adnexa and no mass, fullness, tenderness               Rectovaginal: Confirms               Anus:  normal sphincter tone, no lesions  After patient read information booklet and all questions were answered, informed consent was obtained.      Procedure:  Speculum inserted into vagina. Cervix visualized  and cleansed with betadine solution X 3. Paracervical block placed:  no.   Tenaculum placed on cervix at 12 o'clock position.  Uterus sounded to 7 centimeters.  IUD and inserting device removed from sterile packet and under sterile conditions inserted to fundus of uterus.  IUD released and introducer removed without difficulty.  IUD string trimmed to 2 centimeters.  Remainder string given to patient to feel for identification.  Tenaculum removed.  No bleeding noted.  Speculum removed.  Uterus palpated normal.  Patient tolerated procedure well.  IUD Lot #:[id number].  Exp: 3/18.  Package information attached to consent and scanned into EPIC.  Chaperone was present for exam.  A:  Irregular menstrual cycles most likely due to anovulation and obesity Not currently SA but desirous of contraception Has never had STD testing  P:   GC/Chl testing pending from today Kyleena IUD placed today.  Recheck 6-8 weeks.  Motrin advised for next two days.  Pt knows to call with any concerns/issues. Pap smears starting at 21 recommended.

## 2015-07-31 LAB — GC/CHLAMYDIA PROBE AMP
CT Probe RNA: NOT DETECTED
GC Probe RNA: NOT DETECTED

## 2015-08-02 ENCOUNTER — Telehealth: Payer: Self-pay | Admitting: *Deleted

## 2015-08-02 NOTE — Telephone Encounter (Signed)
Notes Recorded by Dion Body, CMA on 08/02/2015 at 11:07 AM LM for pt with normal results. Per pt release form. At home phone number. Unable to leave voicemail at mobile number. Voicemail not set up.  Notes Recorded by Jerene Bears, MD on 08/01/2015 at 4:40 PM Inform GC/Chl negative. Pt had IUD placed day of testing for GC/Chl. She has IUD recheck appt scheduled for April

## 2015-09-12 ENCOUNTER — Ambulatory Visit: Payer: BLUE CROSS/BLUE SHIELD | Admitting: Obstetrics & Gynecology

## 2015-09-19 ENCOUNTER — Ambulatory Visit (INDEPENDENT_AMBULATORY_CARE_PROVIDER_SITE_OTHER): Payer: BLUE CROSS/BLUE SHIELD | Admitting: Obstetrics & Gynecology

## 2015-09-19 VITALS — BP 138/80 | HR 86 | Ht 68.5 in | Wt 318.0 lb

## 2015-09-19 DIAGNOSIS — Z30431 Encounter for routine checking of intrauterine contraceptive device: Secondary | ICD-10-CM

## 2015-09-19 NOTE — Progress Notes (Signed)
Subjective:     Patient ID: Rebecca SailorsKayla A Snoke, female   DOB: [birth year], 20 y.o.   MRN: [id number]  HPI 20 yo G0 SWF [facility].  She had cramping for about three days after placement.  Used Motrin only for this.  Nothing since.  Had spotting for about a week afterwards.  She has not had any bleeding since them.    Review of Systems  All other systems reviewed and are negative.      Objective:   Physical Exam  Constitutional: She is oriented to person, place, and time. She appears well-developed and well-nourished.  Genitourinary: Vagina normal and uterus normal. There is no rash, tenderness, lesion or injury on the right labia. There is no rash, tenderness, lesion or injury on the left labia. Cervix exhibits no motion tenderness, no discharge and no friability. Right adnexum displays no mass, no tenderness and no fullness. Left adnexum displays no mass, no tenderness and no fullness.  3cm IUD string noted  Lymphadenopathy:       Right: No inguinal adenopathy present.       Left: No inguinal adenopathy present.  Neurological: She is alert and oriented to person, place, and time.  Skin: Skin is warm and dry.  Psychiatric: She has a normal mood and affect.       Assessment:     S/p Kyleena placement, doing well     Plan:     Pt will return for AEX in one year.  Will proceed with initial Pap smear at that point.  Pt knows to call when any concerns/questions.

## 2015-09-20 ENCOUNTER — Encounter: Payer: Self-pay | Admitting: Obstetrics & Gynecology

## 2015-11-24 ENCOUNTER — Emergency Department (HOSPITAL_COMMUNITY)
Admission: EM | Admit: 2015-11-24 | Discharge: 2015-11-24 | Disposition: A | Discharge status: Home / Self Care | Payer: BLUE CROSS/BLUE SHIELD | Attending: Emergency Medicine | Admitting: Emergency Medicine

## 2015-11-24 ENCOUNTER — Emergency Department (HOSPITAL_COMMUNITY): Payer: BLUE CROSS/BLUE SHIELD

## 2015-11-24 ENCOUNTER — Encounter (HOSPITAL_COMMUNITY): Payer: Self-pay | Admitting: *Deleted

## 2015-11-24 ENCOUNTER — Other Ambulatory Visit: Payer: Self-pay

## 2015-11-24 DIAGNOSIS — R0602 Shortness of breath: Secondary | ICD-10-CM | POA: Diagnosis not present

## 2015-11-24 DIAGNOSIS — R0789 Other chest pain: Secondary | ICD-10-CM | POA: Diagnosis not present

## 2015-11-24 DIAGNOSIS — M791 Myalgia: Secondary | ICD-10-CM | POA: Diagnosis not present

## 2015-11-24 DIAGNOSIS — R079 Chest pain, unspecified: Secondary | ICD-10-CM | POA: Diagnosis not present

## 2015-11-24 DIAGNOSIS — M7918 Myalgia, other site: Secondary | ICD-10-CM

## 2015-11-24 LAB — I-STAT CHEM 8, ED
BUN: 12 mg/dL (ref 6–20)
Calcium, Ion: 1.2 mmol/L (ref 1.12–1.23)
Chloride: 102 mmol/L (ref 101–111)
Creatinine, Ser: 0.7 mg/dL (ref 0.44–1.00)
Glucose, Bld: 97 mg/dL (ref 65–99)
HCT: 37 % (ref 36.0–46.0)
Hemoglobin: 12.6 g/dL (ref 12.0–15.0)
Potassium: 3.9 mmol/L (ref 3.5–5.1)
SODIUM: 141 mmol/L (ref 135–145)
TCO2: 24 mmol/L (ref 0–100)

## 2015-11-24 LAB — D-DIMER, QUANTITATIVE (NOT AT ARMC): D DIMER QUANT: 0.36 ug{FEU}/mL (ref 0.00–0.50)

## 2015-11-24 LAB — I-STAT TROPONIN, ED: TROPONIN I, POC: 0 ng/mL (ref 0.00–0.08)

## 2015-11-24 MED ORDER — HYDROCODONE-ACETAMINOPHEN 5-325 MG PO TABS: ORAL_TABLET | ORAL | Status: DC

## 2015-11-24 MED ORDER — METHOCARBAMOL 500 MG PO TABS: 1000.0000 mg | ORAL_TABLET | Freq: Four times a day (QID) | ORAL | Status: DC | PRN

## 2015-11-24 MED ORDER — NAPROXEN 250 MG PO TABS: 250.0000 mg | ORAL_TABLET | Freq: Two times a day (BID) | ORAL | Status: DC | PRN

## 2015-11-24 MED ORDER — HYDROCODONE-ACETAMINOPHEN 5-325 MG PO TABS
1.0000 | ORAL_TABLET | Freq: Once | ORAL | Status: AC
  Administered 2015-11-24: 1 via ORAL
  Filled 2015-11-24: qty 1

## 2015-11-24 NOTE — Discharge Instructions (Signed)
Take the prescriptions as directed.  Apply moist heat or ice to the area(s) of discomfort, for 15 minutes at a time, several times per day for the next few days.  Do not fall asleep on a heating or ice pack.  Call your regular medical doctor on Monday to schedule a follow up appointment this week.  Return to the Emergency Department immediately if worsening. ° °

## 2015-11-24 NOTE — ED Provider Notes (Signed)
CSN: [id number]     Arrival date & time 11/24/15  0123 History   First MD Initiated Contact with Patient 11/24/15 0129     Chief Complaint  Patient presents with  . Chest Pain      HPI Pt was seen at 0130. Per pt, c/o gradual onset and persistence of constant anterior chest "pain" that began several hours ago. Describes the CP as "sharp." Has been associated with SOB and R>L thoracic back and neck "pains." Pt states she has been carrying a heavy book bag on her right shoulder over the past several days. Pt took motrin and ASA PTA. Denies injury, no fevers, no rash, no palpitations, no cough, no abd pain, no N/V/D, no focal motor weakness, no tingling/numbness in extremities.    Past Medical History  Diagnosis Date  . Acute medial meniscus tear of left knee   . Concussion with brief (less than one hour) loss of consciousness 4-28 15  . Acute lateral meniscus tear of left knee 02/02/2014   Past Surgical History  Procedure Laterality Date  . Tonsillectomy    . Knee arthroscopy with lateral menisectomy Left 02/02/2014    Procedure: LEFT KNEE ARTHROSCOPY WITH LATERAL MENISECTOMY;  Surgeon: Eulas Post, MD;  Location: [facility];  Service: Orthopedics;  Laterality: Left;   Family History  Problem Relation Age of Onset  . Breast cancer Paternal Grandmother   . Atrial fibrillation Father    Social History  Substance Use Topics  . Smoking status: Never Smoker   . Smokeless tobacco: Never Used  . Alcohol Use: No   OB History    Gravida Para Term Preterm AB TAB SAB Ectopic Multiple Living       Review of Systems ROS: Statement: All systems negative except as marked or noted in the HPI; Constitutional: Negative for fever and chills. ; ; Eyes: Negative for eye pain, redness and discharge. ; ; ENMT: Negative for ear pain, hoarseness, nasal congestion, sinus pressure and sore throat. ; ; Cardiovascular: Negative for palpitations, diaphoresis, and  peripheral edema. ; ; Respiratory: +SOB. Negative for cough, wheezing and stridor. ; ; Gastrointestinal: Negative for nausea, vomiting, diarrhea, abdominal pain, blood in stool, hematemesis, jaundice and rectal bleeding. . ; ; Genitourinary: Negative for dysuria, flank pain and hematuria. ; ; Musculoskeletal: +CP, back pain. Negative for swelling and direct trauma.; ; Skin: Negative for pruritus, rash, abrasions, blisters, bruising and skin lesion.; ; Neuro: Negative for headache, lightheadedness and neck stiffness. Negative for weakness, altered level of consciousness, altered mental status, extremity weakness, paresthesias, involuntary movement, seizure and syncope.      Allergies  Review of patient's allergies indicates no known allergies.  Home Medications   Prior to Admission medications   Medication Sig Start Date End Date Taking? Authorizing Provider  mupirocin ointment (BACTROBAN) 2 % Apply twice daily to draining areas 07/25/15   Johna Sheriff, MD  nystatin (MYCOSTATIN/NYSTOP) 100000 UNIT/GM POWD Apply twice daily when redness increases 07/25/15   Johna Sheriff, MD  nystatin ointment (MYCOSTATIN) Apply 1 application topically 2 (two) times daily. 07/25/15   Johna Sheriff, MD   BP 147/87 mmHg  Pulse 110  Temp(Src) 98.2 F (36.8 C) (Oral)  Resp 20  Ht  (1.727 m)  Wt 318 lb (144.244 kg)  BMI 48.36 kg/m2  SpO2 99%  LMP 11/17/2015 Physical Exam  0135: Physical examination:  Nursing notes reviewed; Vital signs and  O2 SAT reviewed;  Constitutional: Well developed, Well nourished, Well hydrated, In no acute distress; Head:  Normocephalic, atraumatic; Eyes: EOMI, PERRL, No scleral icterus; ENMT: Mouth and pharynx normal, Mucous membranes moist; Neck: Supple, Full range of motion, No lymphadenopathy; Cardiovascular: Tachycardic rate and rhythm, No gallop; Respiratory: Breath sounds clear & equal bilaterally, No wheezes.  Speaking full sentences with ease, Normal respiratory  effort/excursion; Chest: Nontender, Movement normal; Abdomen: Soft, Nontender, Nondistended, Normal bowel sounds; Genitourinary: No CVA tenderness; Spine:  No midline CS, TS, LS tenderness. +TTP R>L thoracic paraspinal muscles. +TTP right hypertonic trapezius muscle. No rash.;; Extremities: Pulses normal, No tenderness, No edema, No calf edema or asymmetry.; Neuro: AA&Ox3, Major CN grossly intact.  Speech clear. No gross focal motor or sensory deficits in extremities.; Skin: Color normal, Warm, Dry.; Psych:  Anxious, crying.   ED Course  Procedures (including critical care time) Labs Review  Imaging Review  I have personally reviewed and evaluated these images and lab results as part of my medical decision-making.   EKG Interpretation None      MDM  MDM Reviewed: previous chart, nursing note and vitals Reviewed previous: labs and ECG Interpretation: labs, ECG and x-ray   ED ECG REPORT   Date: 11/24/2015  Rate: 100  Rhythm: sinus tachycardia  QRS Axis: normal  Intervals: normal  ST/T Wave abnormalities: normal  Conduction Disutrbances:none  Narrative Interpretation:   Old EKG Reviewed: none available    Results for orders placed or performed during the hospital encounter of 11/24/15  D-dimer, quantitative  Result Value Ref Range   D-Dimer, Quant 0.36 0.00 - 0.50 ug/mL-FEU  I-stat Chem 8, ED  Result Value Ref Range   Sodium 141 135 - 145 mmol/L   Potassium 3.9 3.5 - 5.1 mmol/L   Chloride 102 101 - 111 mmol/L   BUN 12 6 - 20 mg/dL   Creatinine, Ser 1.610.70 0.44 - 1.00 mg/dL   Glucose, Bld 97 65 - 99 mg/dL   Calcium, Ion 0.961.20 1.12 - 1.23 mmol/L   TCO2 24 0 - 100 mmol/L   Hemoglobin 12.6 12.0 - 15.0 g/dL   HCT 04.537.0 40.936.0 - 81.146.0 %  I-stat troponin, ED  Result Value Ref Range   Troponin i, poc 0.00 0.00 - 0.08 ng/mL   Comment 3           Dg Chest 2 View 11/24/2015  CLINICAL DATA:  Mid chest pain earlier today. Worse on palpation. Shortness of breath. EXAM: CHEST  2 VIEW  COMPARISON:  None. FINDINGS: The heart size and mediastinal contours are within normal limits. Both lungs are clear. The visualized skeletal structures are unremarkable. IMPRESSION: No active cardiopulmonary disease. Electronically Signed   By: Burman NievesWilliam  Stevens M.D.   On: 11/24/2015 02:19    0240:  Feels better after meds and wants to go home now. Workup reassuring. Doubt PE with normal d-dimer and low risk Wells. Doubt ACS as cause for symptoms given Heart score 0/TIMI 0. Tx symptomatically, f/u PMD. Dx and testing d/w pt and family.  Questions answered.  Verb understanding, agreeable to d/c home with outpt f/u.   Samuel JesterKathleen Kimi Kroft, DO 11/28/15 [phone]

## 2015-11-24 NOTE — ED Notes (Signed)
Pt c/o generalized chest pain that is described as sharp in nature, worse with palpation, pain is associated sob,

## 2016-01-22 ENCOUNTER — Encounter: Payer: Self-pay | Admitting: Pediatrics

## 2016-01-22 ENCOUNTER — Ambulatory Visit: Payer: BLUE CROSS/BLUE SHIELD | Admitting: Pediatrics

## 2016-01-22 ENCOUNTER — Ambulatory Visit (INDEPENDENT_AMBULATORY_CARE_PROVIDER_SITE_OTHER): Payer: BLUE CROSS/BLUE SHIELD | Admitting: Pediatrics

## 2016-01-22 VITALS — BP 131/80 | HR 83 | Temp 97.3°F | Ht 68.0 in | Wt 331.2 lb

## 2016-01-22 DIAGNOSIS — R1013 Epigastric pain: Secondary | ICD-10-CM | POA: Diagnosis not present

## 2016-01-22 DIAGNOSIS — R45851 Suicidal ideations: Secondary | ICD-10-CM

## 2016-01-22 DIAGNOSIS — B372 Candidiasis of skin and nail: Secondary | ICD-10-CM | POA: Diagnosis not present

## 2016-01-22 DIAGNOSIS — R635 Abnormal weight gain: Secondary | ICD-10-CM

## 2016-01-22 MED ORDER — CITALOPRAM HYDROBROMIDE 20 MG PO TABS: 20.0000 mg | ORAL_TABLET | Freq: Every day | ORAL | 3 refills | Status: DC

## 2016-01-22 MED ORDER — FLUCONAZOLE 150 MG PO TABS: 150.0000 mg | ORAL_TABLET | Freq: Once | ORAL | 0 refills | Status: AC

## 2016-01-22 MED ORDER — NYSTATIN 100000 UNIT/GM EX OINT: 1.0000 "application " | TOPICAL_OINTMENT | Freq: Two times a day (BID) | CUTANEOUS | 1 refills | Status: DC

## 2016-01-22 MED ORDER — FAMOTIDINE 20 MG PO TABS: 20.0000 mg | ORAL_TABLET | Freq: Two times a day (BID) | ORAL | 3 refills | Status: DC

## 2016-01-22 NOTE — Progress Notes (Signed)
Subjective:    Patient ID: Rebecca Gardner, female    DOB: 27-Feb-1996, 20 y.o.   MRN: [id number]  CC: Follow-up (Breast Exam, Boil)   HPI: Rebecca Gardner is a 20 y.o. 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, medical Says she wouldn't want to hurt herself because would hurt close friends and family Never been on medicine before Has had thoughts about not wanting to be here anymore  Weight gain over past 2 years Pt says due to increased stress Fast food a few times a week  GAD 7 : Generalized Anxiety Score 01/22/2016  Nervous, Anxious, on Edge 2  Control/stop worrying 2  Worry too much - different things 2  Trouble relaxing 2  Restless 2  Easily annoyed or irritable 2  Afraid - awful might happen 2  Total GAD 7 Score 14  Anxiety Difficulty Somewhat difficult    Depression screen [facility] 2/9 01/22/2016 07/25/2015  Decreased Interest 2 0  Down, Depressed, Hopeless 1 0  PHQ - 2 Score 3 0  Altered sleeping 2 -  Tired, decreased energy 2 -  Change in appetite 2 -  Feeling bad or failure about yourself  3 -  Trouble concentrating 1 -  Moving slowly or fidgety/restless 1 -  Suicidal thoughts 2 -  PHQ-9 Score 16 -  Difficult doing work/chores Somewhat difficult -     Relevant past medical, surgical, family and social history reviewed. Interim medical history since our last visit reviewed. Allergies and medications reviewed and updated.  History  Smoking Status  . Never Smoker  Smokeless Tobacco  . Never Used    ROS: Per HPI      Objective:    BP 131/80   Pulse 83   Temp 97.3 F  (36.3 C) (Oral)   Ht 5' 8" (1.727 m)   Wt (!) 331 lb 3.2 oz (150.2 kg)   BMI 50.36 kg/m   Wt Readings from Last 3 Encounters:  01/22/16 (!) 331 lb 3.2 oz (150.2 kg)  11/24/15 (!) 318 lb (144.2 kg)  09/19/15 (!) 318 lb (144.2 kg)     Gen: NAD, alert, cooperative with exam, NCAT EYES: EOMI, no conjunctival injection, or no icterus CV: NRRR, normal S1/S2, no murmur, distal pulses 2+ b/l Resp: CTABL, no wheezes, normal WOB Abd: +BS, soft, NTND. no guarding or organomegaly Ext: No edema, warm Neuro: Alert and oriented, strength equal b/l UE and LE, coordination grossly normal MSK: normal muscle bulk Psych: full affect, tearful at times Skin: no boils on breasts b/l Breast: nl exam b/l     Assessment & Plan:  Rebecca Gardner was seen today for follow-up multiple med problems  Diagnoses and all orders for this visit:  Suicidal ideation Pt says she feels comfortable talking with friend Rebecca Gardner or calling crisis number if thoughts get worse Will refer to psychology, start celexa Come back in 2 weeks for reassess Sooner if needed -     Ambulatory referral to Psychology -     citalopram (CELEXA) 20 MG tablet; Take 1 tablet (  20 mg total) by mouth daily.  Candidal intertrigo -     nystatin ointment (MYCOSTATIN); Apply 1 application topically 2 (two) times daily. -     fluconazole (DIFLUCAN) 150 MG tablet; Take 1 tablet (150 mg total) by mouth once.  Weight gain -     TSH  Epigastric pain -     CMP14+EGFR -     famotidine (PEPCID) 20 MG tablet; Take 1 tablet (20 mg total) by mouth 2 (two) times daily.  I spent 25 minutes with the patient with over 50% of the encounter time dedicated to counseling on the above problems.   Follow up plan: Return in about 2 weeks (around 02/05/2016).  Carol Vincent, MD [facility] 01/22/2016, 9:10 AM  

## 2016-01-23 LAB — CMP14+EGFR
ALBUMIN: 4.3 g/dL (ref 3.5–5.5)
ALK PHOS: 61 IU/L (ref 39–117)
ALT: 25 IU/L (ref 0–32)
AST: 17 IU/L (ref 0–40)
Albumin/Globulin Ratio: 1.3 (ref 1.2–2.2)
BUN / CREAT RATIO: 16 (ref 9–23)
BUN: 12 mg/dL (ref 6–20)
CHLORIDE: 100 mmol/L (ref 96–106)
CO2: 21 mmol/L (ref 18–29)
Calcium: 9.7 mg/dL (ref 8.7–10.2)
Creatinine, Ser: 0.77 mg/dL (ref 0.57–1.00)
GFR calc Af Amer: 129 mL/min/{1.73_m2} (ref >59)
GFR calc non Af Amer: 112 mL/min/{1.73_m2} (ref >59)
GLOBULIN, TOTAL: 3.2 g/dL (ref 1.5–4.5)
Glucose: 95 mg/dL (ref 65–99)
POTASSIUM: 4.4 mmol/L (ref 3.5–5.2)
SODIUM: 138 mmol/L (ref 134–144)
Total Protein: 7.5 g/dL (ref 6.0–8.5)

## 2016-01-23 LAB — TSH: TSH: 2.17 u[IU]/mL (ref 0.450–4.500)

## 2016-01-23 NOTE — Progress Notes (Signed)
Patient aware.

## 2016-01-24 DIAGNOSIS — G44219 Episodic tension-type headache, not intractable: Secondary | ICD-10-CM | POA: Diagnosis not present

## 2016-02-05 ENCOUNTER — Encounter: Payer: Self-pay | Admitting: Pediatrics

## 2016-02-05 ENCOUNTER — Ambulatory Visit (INDEPENDENT_AMBULATORY_CARE_PROVIDER_SITE_OTHER): Payer: BLUE CROSS/BLUE SHIELD | Admitting: Pediatrics

## 2016-02-05 VITALS — BP 135/84 | HR 78 | Temp 98.4°F | Ht 68.0 in | Wt 327.6 lb

## 2016-02-05 DIAGNOSIS — H4710 Unspecified papilledema: Secondary | ICD-10-CM | POA: Diagnosis not present

## 2016-02-05 DIAGNOSIS — R519 Headache, unspecified: Secondary | ICD-10-CM

## 2016-02-05 DIAGNOSIS — F418 Other specified anxiety disorders: Secondary | ICD-10-CM | POA: Insufficient documentation

## 2016-02-05 DIAGNOSIS — F32A Depression, unspecified: Secondary | ICD-10-CM

## 2016-02-05 DIAGNOSIS — E669 Obesity, unspecified: Secondary | ICD-10-CM

## 2016-02-05 DIAGNOSIS — F329 Major depressive disorder, single episode, unspecified: Secondary | ICD-10-CM | POA: Diagnosis not present

## 2016-02-05 DIAGNOSIS — R51 Headache: Secondary | ICD-10-CM

## 2016-02-05 NOTE — Progress Notes (Signed)
  Subjective:   Patient ID: Rebecca Gardner, female    DOB: [birth year], 20 y.o.   MRN: [id number] CC: Follow-up (2 week) depression  HPI: Rebecca Gardner is a 20 y.o. 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, going to take CNA  Since starting citalopram, has been getting better sleep  Saw eye doctor, told she had swelling behind the eyes Having some headaches Lasts a couple hours Goes from L side to right side of forehead HA happen 5-6 times a week Ibuprofen helps some, doesn't take every Lying down makes HA better Lights make it worse Nausea at times, both with and without the headache Sneezing makes HA worse  [facility] 2[facility] 02/05/2016 01/22/2016 07/25/2015  Decreased Interest 2 2 0  Down, Depressed, Hopeless 1 1 0  PHQ - 2 Score 3 3 0  Altered sleeping 1 2 -  Tired, decreased energy 2 2 -  Change in appetite 1 2 -  Feeling bad or failure about yourself  3 3 -  Trouble concentrating 1 1 -  Moving slowly or fidgety/restless 1 1 -  Suicidal thoughts 2 2 -  PHQ-9 Score 14 16 -  Difficult doing work/chores Somewhat difficult Somewhat difficult -     Relevant past medical, surgical, family and social history reviewed. Allergies and medications reviewed and updated. History  Smoking Status  . Never Smoker  Smokeless Tobacco  . Never Used   ROS: Per HPI   Objective:    BP 135/84 (BP Location: Left Arm, Patient Position: Sitting, Cuff Size: Large)   Pulse 78   Temp 98.4 F (36.9 C) (Oral)   Ht 5\' 8"  (1.727 m)   Wt (!) 327 lb 9.6 oz (148.6 kg)   LMP 01/15/2016 (Approximate)   BMI 49.81 kg/m   Wt Readings from Last 3 Encounters:  02/05/16 (!) 327 lb 9.6 oz (148.6 kg)  01/22/16 (!) 331 lb 3.2 oz (150.2 kg)    11/24/15 (!) 318 lb (144.2 kg)    Gen: NAD, alert, cooperative with exam, NCAT EYES: EOMI, no conjunctival injection, or no icterus ENT:  OP without erythema LYMPH: no cervical LAD CV: NRRR, normal S1/S2, no murmur, distal pulses 2+ b/l Resp: CTABL, no wheezes, normal WOB Abd:  soft, NTND. Ext: No edema, warm Neuro: Alert and oriented, strength equal b/l hand grip, UE and LE, sensation equal UE and LE, CN III-XII, coordination normal MSK: normal muscle bulk Psych: improved affect, full affect   Assessment & Plan:  Rebecca Gardner was seen today for follow-up med problems as below.  Diagnoses and all orders for this visit:  Papilledema If imaging normal refer to neurology for possible pseudotumor -     MR Brain W Wo Contrast; Future  Obesity Discussed lifestyle changes, cont increasing fruit/veg  Depression Symptoms slightly improved Talking with cousin regularly, has been very helpful Good support system Cont citalopram  Nonintractable headache, unspecified chronicity pattern, unspecified headache type Papilledema work up as above  Follow up plan: Return in about 6 weeks (around 03/18/2016). Rex Krasarol Lukus Binion, MD [facility]

## 2016-02-14 ENCOUNTER — Telehealth: Payer: Self-pay | Admitting: Pediatrics

## 2016-02-14 ENCOUNTER — Encounter (HOSPITAL_COMMUNITY): Payer: Self-pay

## 2016-02-14 ENCOUNTER — Ambulatory Visit (HOSPITAL_COMMUNITY)
Admission: RE | Admit: 2016-02-14 | Discharge: 2016-02-14 | Disposition: A | Discharge status: Home / Self Care | Payer: BLUE CROSS/BLUE SHIELD | Source: Ambulatory Visit | Attending: Pediatrics | Admitting: Pediatrics

## 2016-02-14 DIAGNOSIS — H4710 Unspecified papilledema: Secondary | ICD-10-CM

## 2016-02-14 NOTE — Telephone Encounter (Signed)
Please advise 

## 2016-02-14 NOTE — Telephone Encounter (Signed)
Pt aware [facility] date[facility]

## 2016-02-17 ENCOUNTER — Ambulatory Visit (HOSPITAL_COMMUNITY)
Admission: RE | Admit: 2016-02-17 | Discharge: 2016-02-17 | Disposition: A | Discharge status: Home / Self Care | Payer: BLUE CROSS/BLUE SHIELD | Source: Ambulatory Visit | Attending: Pediatrics | Admitting: Pediatrics

## 2016-02-17 ENCOUNTER — Other Ambulatory Visit: Payer: Self-pay | Admitting: Pediatrics

## 2016-02-17 DIAGNOSIS — R51 Headache: Secondary | ICD-10-CM | POA: Insufficient documentation

## 2016-02-17 DIAGNOSIS — H547 Unspecified visual loss: Secondary | ICD-10-CM

## 2016-02-17 DIAGNOSIS — H4710 Unspecified papilledema: Secondary | ICD-10-CM | POA: Insufficient documentation

## 2016-02-17 DIAGNOSIS — H4712 Papilledema associated with decreased ocular pressure: Secondary | ICD-10-CM | POA: Diagnosis not present

## 2016-02-17 MED ORDER — GADOBENATE DIMEGLUMINE 529 MG/ML IV SOLN
20.0000 mL | Freq: Once | INTRAVENOUS | Status: AC
  Administered 2016-02-17: 20 mL via INTRAVENOUS

## 2016-02-24 ENCOUNTER — Encounter: Payer: Self-pay | Admitting: Neurology

## 2016-02-24 ENCOUNTER — Ambulatory Visit (INDEPENDENT_AMBULATORY_CARE_PROVIDER_SITE_OTHER): Payer: BLUE CROSS/BLUE SHIELD | Admitting: Neurology

## 2016-02-24 VITALS — BP 140/80 | HR 88 | Ht 68.0 in | Wt 325.5 lb

## 2016-02-24 DIAGNOSIS — G44219 Episodic tension-type headache, not intractable: Secondary | ICD-10-CM | POA: Diagnosis not present

## 2016-02-24 DIAGNOSIS — H4710 Unspecified papilledema: Secondary | ICD-10-CM | POA: Diagnosis not present

## 2016-02-24 MED ORDER — DIAZEPAM 5 MG PO TABS: ORAL_TABLET | ORAL | 0 refills | Status: DC

## 2016-02-24 NOTE — Progress Notes (Signed)
[facility] Clinic Note - Initial Visit   Date: 02/24/16  Rebecca Gardner MRN: [id number] DOB: [birth year]   Dear Dr. Oswaldo Done:   Thank you for your kind referral of Rebecca Gardner for consultation of papilledema. Although her history is well known to you, please allow Rebecca Gardner to reiterate it for the purpose of our medical record. The patient was accompanied to the clinic by mother and grandmother who also provides collateral information.     History of Present Illness: Rebecca Gardner is a 20 y.o. right-handed Caucasian female with GERD, depression, and morbid obesity  presenting for evaluation of papilledema.    Starting in May 2017, she began having blurred vision and headaches.  In August, she had her eyes checked by Dr. Despina Arias ([facility]) and was told she had bilateral papilledema as well a changes in visual acuity.  She has new prescriptions eye glasses which has helped with her vision changes.  Her headaches are dully, pounding, and bifrontal and radiate to become holocephalic.  Headaches occur 3-4 times per week and last all day, but if she takes ibuprofen, the duration is reduced to 2-3 hours.  She does not have associated nausea/vomiting, photophobia, or phonophobia.  Headaches are improved slightly when she lays supine.  Coughing and sneezing makes her headaches worse.     She had MRI of the brain and orbit which showed mild prominence of the perioptic nerve sheaths, consistent with papilledema.  No mass was seen of the orbit or brain.  She has been using a [place] IUD since January 2017.  She does not use tobacco.   Out-side paper records, electronic medical record, and images have been reviewed where available and summarized as:  MRI orbit wwo contrast 02/18/2016:  Mild prominence of the perioptic nerve sheaths consistent with suspected papilledema. No orbital mass is seen.  MRI brain wwo contrast 02/17/2016:   Normal MRI appearance of the brain  and orbits. No explanation for  Headaches.  Lab Results  Component Value Date   TSH 2.170 01/22/2016    Past Medical History:  Diagnosis Date  . Acute lateral meniscus tear of left knee 02/02/2014  . Acute medial meniscus tear of left knee   . Concussion with brief (less than one hour) loss of consciousness 4-28 15    Past Surgical History:  Procedure Laterality Date  . KNEE ARTHROSCOPY WITH LATERAL MENISECTOMY Left 02/02/2014   Procedure: LEFT KNEE ARTHROSCOPY WITH LATERAL MENISECTOMY;  Surgeon: Eulas Post, MD;  Location: [facility];  Service: Orthopedics;  Laterality: Left;  . TONSILLECTOMY       Medications:  Outpatient Encounter Prescriptions as of 02/24/2016  Medication Sig  . citalopram (CELEXA) 20 MG tablet Take 1 tablet (20 mg total) by mouth daily.  . famotidine (PEPCID) 20 MG tablet Take 1 tablet (20 mg total) by mouth 2 (two) times daily.  . diazepam (VALIUM) 5 MG tablet Take 1 tablet prior to MRI.  Marland Kitchen HYDROcodone-acetaminophen (NORCO/VICODIN) 5-325 MG tablet 1 or 2 tabs PO q6 hours prn pain (Patient not taking: Reported on 02/24/2016)  . mupirocin ointment (BACTROBAN) 2 % Apply twice daily to draining areas (Patient not taking: Reported on 02/24/2016)  . naproxen (NAPROSYN) 250 MG tablet Take 1 tablet (250 mg total) by mouth 2 (two) times daily as needed for mild pain or moderate pain (take with food). (Patient not taking: Reported on 02/24/2016)  . nystatin (MYCOSTATIN/NYSTOP) 100000 UNIT/GM POWD Apply twice  daily when redness increases (Patient not taking: Reported on 02/24/2016)  . nystatin ointment (MYCOSTATIN) Apply 1 application topically 2 (two) times daily. (Patient not taking: Reported on 02/24/2016)   No facility-administered encounter medications on file as of 02/24/2016.      Allergies: No Known Allergies  Family History: Family History  Problem Relation Age of Onset  . Breast cancer Paternal Grandmother   . Healthy Mother   .  Atrial fibrillation Father   . Prostate cancer Paternal Grandfather   . Migraines Maternal Uncle     Social History: Social History  Substance Use Topics  . Smoking status: Never Smoker  . Smokeless tobacco: Never Used  . Alcohol use No   Social History   Social History Narrative   Lives with mom and dad.  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  No children.     Education: Associates, currently in college with interest in ancillary medical services    Review of Systems:  CONSTITUTIONAL: No fevers, chills, night sweats, or weight loss.  EYES: No visual changes or eye pain ENT: No hearing changes.  No history of nose bleeds.   RESPIRATORY: No cough, wheezing and shortness of breath.   CARDIOVASCULAR: Negative for chest pain, and palpitations.   GI: Negative for abdominal discomfort, blood in stools or black stools.  No recent change in bowel habits.   GU:  No history of incontinence.   MUSCLOSKELETAL: No history of joint pain or swelling.  No myalgias.   SKIN: Negative for lesions, rash, and itching.   HEMATOLOGY/ONCOLOGY: Negative for prolonged bleeding, bruising easily, and swollen nodes.  No history of cancer.   ENDOCRINE: Negative for cold or heat intolerance, polydipsia or goiter.   PSYCH:  +depression or anxiety symptoms.   NEURO: As Above.   Vital Signs:  BP 140/80   Pulse 88   Ht 5\' 8"  (1.727 m)   Wt (!) 325 lb 8 oz (147.6 kg)   LMP 01/15/2016 (Approximate)   SpO2 98%   BMI 49.49 kg/m    General Medical Exam:   General:  Well appearing, comfortable.   Eyes/ENT: see cranial nerve examination.   Neck: No masses appreciated.  Full range of motion without tenderness.  No carotid bruits. Respiratory:  Clear to auscultation, good air entry bilaterally.   Cardiac:  Regular rate and rhythm, no murmur.   Extremities:  No deformities, edema, or skin discoloration.  Skin:  No rashes or lesions.  Neurological Exam: MENTAL STATUS including orientation to time, place, person,  recent and remote memory, attention span and concentration, language, and fund of knowledge is normal.  Speech is not dysarthric.  CRANIAL NERVES: II:  No visual field defects.  Papilledema present bilaterally, L > R.   III-IV-VI: Pupils equal round and reactive to light.  Normal conjugate, extra-ocular eye movements in all directions of gaze.  No nystagmus.  No ptosis.   V:  Normal facial sensation.     VII:  Normal facial symmetry and movements.   VIII:  Normal hearing and vestibular function.   IX-X:  Normal palatal movement.   XI:  Normal shoulder shrug and head rotation.   XII:  Normal tongue strength and range of motion, no deviation or fasciculation.  MOTOR:  No atrophy, fasciculations or abnormal movements.  No pronator drift.  Tone is normal.    Right Upper Extremity:    Left Upper Extremity:    Deltoid  5/5   Deltoid  5/5   Biceps  5/5  Biceps  5/5   Triceps  5/5   Triceps  5/5   Wrist extensors  5/5   Wrist extensors  5/5   Wrist flexors  5/5   Wrist flexors  5/5   Finger extensors  5/5   Finger extensors  5/5   Finger flexors  5/5   Finger flexors  5/5   Dorsal interossei  5/5   Dorsal interossei  5/5   Abductor pollicis  5/5   Abductor pollicis  5/5   Tone (Ashworth scale)  0  Tone (Ashworth scale)  0   Right Lower Extremity:    Left Lower Extremity:    Hip flexors  5/5   Hip flexors  5/5   Hip extensors  5/5   Hip extensors  5/5   Knee flexors  5/5   Knee flexors  5/5   Knee extensors  5/5   Knee extensors  5/5   Dorsiflexors  5/5   Dorsiflexors  5/5   Plantarflexors  5/5   Plantarflexors  5/5   Toe extensors  5/5   Toe extensors  5/5   Toe flexors  5/5   Toe flexors  5/5   Tone (Ashworth scale)  0  Tone (Ashworth scale)  0   MSRs:  Right                                                                 Left brachioradialis 2+  brachioradialis 2+  biceps 2+  biceps 2+  triceps 2+  triceps 2+  patellar 2+  patellar 2+  ankle jerk 2+  ankle jerk 2+  Hoffman no   Hoffman no  plantar response down  plantar response down   SENSORY:  Normal and symmetric perception of light touch, pinprick, vibration, and proprioception.  Romberg's sign absent.   COORDINATION/GAIT: Normal finger-to- nose-finger and heel-to-shin.  Intact rapid alternating movements bilaterally.  Able to rise from a chair without using arms.  Gait narrow based and stable. Tandem and stressed gait intact.    IMPRESSION: Bilateral papilledema, vision changes, and new onset headaches  - MRI orbit reviewed with patient and shows mild prominence of the perioptic nerve sheaths consistent with suspected papilledema  - MRI brain with no mass or structural changes  - Proceed with MR venogram to look for venous sinus thrombosis as she is has intrauterine device and signs of raised ICP  - If MRV is negative, proceed with CSF testing for opening and closing pressure - high volume tap, if OP is elevated.    - Check CSF cell count and diff, protein, glucose, routine cultures, IgG index  I had a long discussion regarding the above testing and potential diagnosis including idiopathic intracranial hypertension (pseudotumor cerebri).  Further recommendations will be based on the results of her testing. If there is evidence of idiopathic intracranial hypertension, she will need to discontinue her IUD and discuss alternative options with her OBGYN.  Return to clinic in 3 months.   The duration of this appointment visit was 60 minutes of face-to-face time with the patient.  Greater than 50% of this time was spent in counseling, explanation of diagnosis, planning of further management, and coordination of care.   Thank you for allowing me to participate in patient's care.  If I can answer any additional questions, I would be pleased to do so.    Sincerely,    Sinthia Karabin K. Posey Pronto, DO

## 2016-02-24 NOTE — Patient Instructions (Addendum)
1.  Check MR venogram 2.  We will schedule you for spinal fluid testing and call you with the results 3.  Return to clinic in 3 months

## 2016-02-24 NOTE — Progress Notes (Signed)
Note faxed.

## 2016-02-28 ENCOUNTER — Emergency Department (HOSPITAL_COMMUNITY): Payer: BLUE CROSS/BLUE SHIELD

## 2016-02-28 ENCOUNTER — Encounter (HOSPITAL_COMMUNITY): Payer: Self-pay | Admitting: Emergency Medicine

## 2016-02-28 ENCOUNTER — Emergency Department (HOSPITAL_COMMUNITY)
Admission: EM | Admit: 2016-02-28 | Discharge: 2016-02-28 | Disposition: A | Discharge status: Home / Self Care | Payer: BLUE CROSS/BLUE SHIELD | Attending: Emergency Medicine | Admitting: Emergency Medicine

## 2016-02-28 DIAGNOSIS — Z79899 Other long term (current) drug therapy: Secondary | ICD-10-CM | POA: Diagnosis not present

## 2016-02-28 DIAGNOSIS — K8020 Calculus of gallbladder without cholecystitis without obstruction: Secondary | ICD-10-CM | POA: Diagnosis not present

## 2016-02-28 DIAGNOSIS — R52 Pain, unspecified: Secondary | ICD-10-CM

## 2016-02-28 DIAGNOSIS — R1013 Epigastric pain: Secondary | ICD-10-CM | POA: Diagnosis not present

## 2016-02-28 DIAGNOSIS — G8929 Other chronic pain: Secondary | ICD-10-CM | POA: Diagnosis not present

## 2016-02-28 LAB — URINALYSIS, ROUTINE W REFLEX MICROSCOPIC
Bilirubin Urine: NEGATIVE
GLUCOSE, UA: NEGATIVE mg/dL
HGB URINE DIPSTICK: NEGATIVE
KETONES UR: NEGATIVE mg/dL
Leukocytes, UA: NEGATIVE
Nitrite: NEGATIVE
PROTEIN: NEGATIVE mg/dL
Specific Gravity, Urine: 1.022 (ref 1.005–1.030)
pH: 5.5 (ref 5.0–8.0)

## 2016-02-28 LAB — CBC WITH DIFFERENTIAL/PLATELET
BASOS PCT: 0 %
Basophils Absolute: 0 10*3/uL (ref 0.0–0.1)
EOS ABS: 0.2 10*3/uL (ref 0.0–0.7)
Eosinophils Relative: 2 %
HCT: 38 % (ref 36.0–46.0)
Hemoglobin: 11.9 g/dL — ABNORMAL LOW (ref 12.0–15.0)
Lymphocytes Relative: 32 %
Lymphs Abs: 3.4 10*3/uL (ref 0.7–4.0)
MCH: 24.5 pg — ABNORMAL LOW (ref 26.0–34.0)
MCHC: 31.3 g/dL (ref 30.0–36.0)
MCV: 78.2 fL (ref 78.0–100.0)
MONO ABS: 0.7 10*3/uL (ref 0.1–1.0)
MONOS PCT: 6 %
Neutro Abs: 6.5 10*3/uL (ref 1.7–7.7)
Neutrophils Relative %: 60 %
PLATELETS: 330 10*3/uL (ref 150–400)
RBC: 4.86 MIL/uL (ref 3.87–5.11)
RDW: 14.9 % (ref 11.5–15.5)
WBC: 10.9 10*3/uL — ABNORMAL HIGH (ref 4.0–10.5)

## 2016-02-28 LAB — COMPREHENSIVE METABOLIC PANEL
ALBUMIN: 3.6 g/dL (ref 3.5–5.0)
ALK PHOS: 49 U/L (ref 38–126)
ALT: 23 U/L (ref 14–54)
AST: 32 U/L (ref 15–41)
Anion gap: 10 (ref 5–15)
BILIRUBIN TOTAL: 0.4 mg/dL (ref 0.3–1.2)
BUN: 8 mg/dL (ref 6–20)
CALCIUM: 9.2 mg/dL (ref 8.9–10.3)
CO2: 21 mmol/L — AB (ref 22–32)
CREATININE: 0.69 mg/dL (ref 0.44–1.00)
Chloride: 105 mmol/L (ref 101–111)
GFR calc non Af Amer: >60 mL/min (ref >60)
GLUCOSE: 88 mg/dL (ref 65–99)
Potassium: 3.8 mmol/L (ref 3.5–5.1)
SODIUM: 136 mmol/L (ref 135–145)
Total Protein: 6.8 g/dL (ref 6.5–8.1)

## 2016-02-28 LAB — POC URINE PREG, ED: PREG TEST UR: NEGATIVE

## 2016-02-28 LAB — LIPASE, BLOOD: LIPASE: 25 U/L (ref 11–51)

## 2016-02-28 MED ORDER — FENTANYL CITRATE (PF) 100 MCG/2ML IJ SOLN
100.0000 ug | Freq: Once | INTRAMUSCULAR | Status: AC
  Administered 2016-02-28: 100 ug via INTRAVENOUS
  Filled 2016-02-28: qty 2

## 2016-02-28 MED ORDER — ONDANSETRON HCL 4 MG/2ML IJ SOLN
4.0000 mg | Freq: Once | INTRAMUSCULAR | Status: AC
  Administered 2016-02-28: 4 mg via INTRAVENOUS
  Filled 2016-02-28: qty 2

## 2016-02-28 MED ORDER — SODIUM CHLORIDE 0.9 % IV SOLN
Freq: Once | INTRAVENOUS | Status: AC
  Administered 2016-02-28: 500 mL/h via INTRAVENOUS

## 2016-02-28 NOTE — ED Notes (Signed)
Pt returns from US.

## 2016-02-28 NOTE — ED Provider Notes (Signed)
[facility]-EMERGENCY DEPT Provider Note   CSN: [id number] Arrival date & time: 02/28/16  0601     History   Chief Complaint Chief Complaint  Patient presents with  . Abdominal Pain  . Chest Pain  . Back Pain    HPI Rebecca Gardner is a 20 y.o. female.  The history is provided by the patient.  Abdominal Pain   The current episode started more than 1 week ago. The problem occurs every several days. The problem has been gradually worsening. The pain is located in the epigastric region and RUQ. The pain is moderate. Associated symptoms include nausea, vomiting and dysuria. Pertinent negatives include fever, hematochezia and melena. Associated symptoms comments: Chronic unchanged headaches . The symptoms are aggravated by certain positions and palpation. Nothing relieves the symptoms.  Chest Pain   Associated symptoms include abdominal pain, back pain, nausea and vomiting. Pertinent negatives include no cough and no fever.  Back Pain   Associated symptoms include chest pain, abdominal pain and dysuria. Pertinent negatives include no fever.  Patient reports having episodes of epigastric/RUQ pain that radiate to chest/back/shoulder for at least 3 months.  It occurs frequently and is increasing in frequency Currently she has abd pain No active CP at this time She has some SOB at times but no cough/hemoptysis No h/o ETOH abuse She does use NSAIDs frequently, but no h/o GI bleed   Past Medical History:  Diagnosis Date  . Acute lateral meniscus tear of left knee 02/02/2014  . Acute medial meniscus tear of left knee   . Concussion with brief (less than one hour) loss of consciousness 4-28 15    Patient Active Problem List   Diagnosis Date Noted  . 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 Depression 02/05/2016  . Cephalalgia 02/05/2016  . Irregular periods/menstrual cycles 07/30/2015  . Obesity 07/30/2015  . IUD (intrauterine device) in place 07/30/2015  . Acute lateral meniscus tear of left knee  02/02/2014    Past Surgical History:  Procedure Laterality Date  . KNEE ARTHROSCOPY WITH LATERAL MENISECTOMY Left 02/02/2014   Procedure: LEFT KNEE ARTHROSCOPY WITH LATERAL MENISECTOMY;  Surgeon: Eulas Post, MD;  Location: [facility];  Service: Orthopedics;  Laterality: Left;  . TONSILLECTOMY      OB History    Gravida Para Term Preterm AB Living   0 0 0 0 0 0   SAB TAB Ectopic Multiple Live Births   0 0 0 0         Home Medications    Prior to Admission medications   Medication Sig Start Date End Date Taking? Authorizing Provider  citalopram (CELEXA) 20 MG tablet Take 1 tablet (20 mg total) by mouth daily. 01/22/16  Yes Johna Sheriff, MD  famotidine (PEPCID) 20 MG tablet Take 1 tablet (20 mg total) by mouth 2 (two) times daily. 01/22/16  Yes Johna Sheriff, MD  HYDROcodone-acetaminophen (NORCO/VICODIN) 5-325 MG tablet 1 or 2 tabs PO q6 hours prn pain 11/24/15  Yes Samuel Jester, DO  naproxen (NAPROSYN) 250 MG tablet Take 1 tablet (250 mg total) by mouth 2 (two) times daily as needed for mild pain or moderate pain (take with food). 11/24/15  Yes Samuel Jester, DO  diazepam (VALIUM) 5 MG tablet Take 1 tablet prior to MRI. Patient not taking: Reported on 02/28/2016 02/24/16   Glendale Chard, DO  mupirocin ointment (BACTROBAN) 2 % Apply twice daily to draining areas Patient not taking: Reported on 02/28/2016 07/25/15   Norwood Levo  Oswaldo Done, MD  nystatin (MYCOSTATIN/NYSTOP) 100000 UNIT/GM POWD Apply twice daily when redness increases Patient not taking: Reported on 02/28/2016 07/25/15   Johna Sheriff, MD  nystatin ointment (MYCOSTATIN) Apply 1 application topically 2 (two) times daily. Patient not taking: Reported on 02/28/2016 01/22/16   Johna Sheriff, MD    Family History Family History  Problem Relation Age of Onset  . Breast cancer Paternal Grandmother   . Healthy Mother   . Atrial fibrillation Father   . Prostate cancer Paternal Grandfather   .  Migraines Maternal Uncle     Social History Social History  Substance Use Topics  . Smoking status: Never Smoker  . Smokeless tobacco: Never Used  . Alcohol use No     Allergies   Review of patient's allergies indicates no known allergies.   Review of Systems Review of Systems  Constitutional: Negative for fever.  Respiratory: Negative for cough.   Cardiovascular: Positive for chest pain.  Gastrointestinal: Positive for abdominal pain, nausea and vomiting. Negative for hematochezia and melena.  Genitourinary: Positive for dysuria.  Musculoskeletal: Positive for back pain.  All other systems reviewed and are negative.    Physical Exam Updated Vital Signs BP 181/84   Pulse 106   Temp 98.7 F (37.1 C) (Oral)   Resp 26   Ht 5\' 8"  (1.727 m)   Wt (!) 147.4 kg   LMP 02/10/2016 (Exact Date)   SpO2 99%   BMI 49.42 kg/m   Physical Exam CONSTITUTIONAL: Well developed/well nourished HEAD: Normocephalic/atraumatic EYES: EOMI/PERRL, no icterus ENMT: Mucous membranes moist NECK: supple no meningeal signs SPINE/BACK:mild tenderness to thoracic spine.  No bruising/crepitance/stepoffs noted to spine CV: S1/S2 noted, no murmurs/rubs/gallops noted LUNGS: Lungs are clear to auscultation bilaterally, no apparent distress ABDOMEN: soft, moderate RUQ/Epigastric tenderness, no rebound or guarding, bowel sounds noted throughout abdomen GU:no cva tenderness NEURO: Pt is awake/alert/appropriate, moves all extremitiesx4.  No facial droop.   EXTREMITIES: pulses normal/equal, full ROM SKIN: warm, color normal PSYCH: no abnormalities of mood noted, alert and oriented to situation   ED Treatments / Results  Labs (all labs ordered are listed, but only abnormal results are displayed) Labs Reviewed  COMPREHENSIVE METABOLIC PANEL - Abnormal; Notable for the following:       Result Value   CO2 21 (*)    All other components within normal limits  CBC WITH DIFFERENTIAL/PLATELET - Abnormal;  Notable for the following:    WBC 10.9 (*)    Hemoglobin 11.9 (*)    MCH 24.5 (*)    All other components within normal limits  URINALYSIS, ROUTINE W REFLEX MICROSCOPIC (NOT AT [facility]) - Abnormal; Notable for the following:    APPearance HAZY (*)    All other components within normal limits  LIPASE, BLOOD  POC URINE PREG, ED    EKG  EKG Interpretation  Date/Time:  Friday February 28 2016 06:01:16 EDT Ventricular Rate:  104 PR Interval:  124 QRS Duration: 86 QT Interval:  354 QTC Calculation: 465 R Axis:   65 Text Interpretation:  Sinus tachycardia Otherwise normal ECG No significant change since last tracing Confirmed by Bebe Shaggy  MD, Dalonda Simoni ([id number]) on 02/28/2016 7:42:12 AM       Radiology US Abdomen Limited Ruq  Result Date: 02/28/2016 CLINICAL DATA:  Right upper quadrant pain since June EXAM: US ABDOMEN LIMITED - RIGHT UPPER QUADRANT COMPARISON:  None. FINDINGS: Gallbladder: Small layering stones, the largest 3 mm. No wall thickening. Negative sonographic Murphy's. Common bile duct: Diameter: Normal  caliber, 5 mm Liver: No focal lesion identified. Within normal limits in parenchymal echogenicity. IMPRESSION: Cholelithiasis.  No sonographic evidence of acute cholecystitis. Electronically Signed   By: Charlett NoseKevin  Dover M.D.   On: 02/28/2016 09:53    Procedures Procedures (including critical care time)  Medications Ordered in ED Medications  fentaNYL (SUBLIMAZE) injection 100 mcg (100 mcg Intravenous Given 02/28/16 0847)  ondansetron (ZOFRAN) injection 4 mg (4 mg Intravenous Given 02/28/16 0836)  0.9 %  sodium chloride infusion (500 mL/hr Intravenous New Bag/Given 02/28/16 0830)     Initial Impression / Assessment and Plan / ED Course  I have reviewed the triage vital signs and the nursing notes.  Pertinent labs & imaging results that were available during my care of the patient were reviewed by me and considered in my medical decision making (see chart for details).  Clinical  Course    Pt here with epigastric pain Imaging/labs ordered Also - reports chronic HA that is unchanged and being evaluated by neuro as outpatient 10:33 AM Pt noted to have cholelithiasis She feels improved Labs reassuring She has pain meds at home Surgical referral given We discussed strict return precautions  Final Clinical Impressions(s) / ED Diagnoses   Final diagnoses:  Pain  Calculus of gallbladder without cholecystitis without obstruction    New Prescriptions New Prescriptions   No medications on file     Zadie Rhineonald Mcclellan Demarais, MD 02/28/16 [phone]

## 2016-02-28 NOTE — ED Triage Notes (Signed)
Patient reports that she has been having epigastric abdominal pain which radiates to midchest and then to her back x 3 months. Reports abdominal pain worse than ever been this morning. Reports nausea. Denies vomiting or diarrhea.

## 2016-02-28 NOTE — ED Notes (Signed)
Discharge instructions discussed with pt and family. All questions addressed. Pt home stable with steady gait.

## 2016-03-12 ENCOUNTER — Ambulatory Visit: Admission: RE | Admit: 2016-03-12 | Payer: PRIVATE HEALTH INSURANCE | Source: Ambulatory Visit

## 2016-03-12 ENCOUNTER — Ambulatory Visit: Payer: Self-pay | Admitting: General Surgery

## 2016-03-12 ENCOUNTER — Encounter: Payer: Self-pay | Admitting: Pediatrics

## 2016-03-12 ENCOUNTER — Ambulatory Visit (INDEPENDENT_AMBULATORY_CARE_PROVIDER_SITE_OTHER): Payer: BLUE CROSS/BLUE SHIELD | Admitting: Pediatrics

## 2016-03-12 VITALS — BP 101/65 | HR 90 | Temp 98.2°F | Ht 68.0 in | Wt 323.6 lb

## 2016-03-12 DIAGNOSIS — R1011 Right upper quadrant pain: Secondary | ICD-10-CM | POA: Diagnosis not present

## 2016-03-12 DIAGNOSIS — H4710 Unspecified papilledema: Secondary | ICD-10-CM

## 2016-03-12 DIAGNOSIS — R45851 Suicidal ideations: Secondary | ICD-10-CM | POA: Diagnosis not present

## 2016-03-12 DIAGNOSIS — F321 Major depressive disorder, single episode, moderate: Secondary | ICD-10-CM

## 2016-03-12 DIAGNOSIS — K8020 Calculus of gallbladder without cholecystitis without obstruction: Secondary | ICD-10-CM

## 2016-03-12 MED ORDER — CITALOPRAM HYDROBROMIDE 40 MG PO TABS: 40.0000 mg | ORAL_TABLET | Freq: Every day | ORAL | 1 refills | Status: DC

## 2016-03-12 MED ORDER — CITALOPRAM HYDROBROMIDE 20 MG PO TABS: 20.0000 mg | ORAL_TABLET | Freq: Every day | ORAL | 3 refills | Status: DC

## 2016-03-12 NOTE — Progress Notes (Signed)
Subjective:   Patient ID: Rebecca Gardner, female    DOB: 08/12/1995, 20 y.o.   MRN: [id number] CC: Follow-up (6 week)  HPI: Rebecca Gardner is a 20 y.o. female presenting for Follow-up (6 week)  Consultation for surgery to remove gall bladder today Has MRV for papilledema w/u today Headaches about the same MRI without mass  Still with abd pain Worsening pain with eating Midline pain Sometimes wakes her up from sleep Doesn't have pain with eating everything Working on decreasing sugar and fat Pizza makes pain worse  Living with parents now [place] Estelle was rough day from mood problems Started with break up from boyfriend Still talking with cousin, has been very helpful Says her parents are very supportive, she hasnt talked with them about her mood problems before Has had thoughts about "whats the point" Passively thought about hurting herself Watches "friends" on bad days when mood is down and talks with her cousin   GAD 7 : Generalized Anxiety Score 03/12/2016 01/22/2016  Nervous, Anxious, on Edge 1 2  Control/stop worrying 1 2  Worry too much - different things 1 2  Trouble relaxing 2 2  Restless 1 2  Easily annoyed or irritable 2 2  Afraid - awful might happen 2 2  Total GAD 7 Score 10 14  Anxiety Difficulty - Somewhat difficult    Depression screen [facility] 2/9 03/12/2016 02/05/2016 01/22/2016 07/25/2015  Decreased Interest 2 2 2  0  Down, Depressed, Hopeless 2 1 1  0  PHQ - 2 Score 4 3 3  0  Altered sleeping 2 1 2  -  Tired, decreased energy 2 2 2  -  Change in appetite 2 1 2  -  Feeling bad or failure about yourself  3 3 3  -  Trouble concentrating 2 1 1  -  Moving slowly or fidgety/restless 2 1 1  -  Suicidal thoughts 2 2 2  -  PHQ-9 Score 19 14 16  -  Difficult doing work/chores - Somewhat difficult Somewhat difficult -     Relevant past medical, surgical, family and social history reviewed. Allergies and medications reviewed and updated. History  Smoking Status  . Never  Smoker  Smokeless Tobacco  . Never Used   ROS: Per HPI   Objective:    BP 101/65   Pulse 90   Temp 98.2 F (36.8 C) (Oral)   Ht 5\' 8"  (1.727 m)   Wt (!) 323 lb 9.6 oz (146.8 kg)   LMP 02/10/2016 (Exact Date)   BMI 49.20 kg/m   Wt Readings from Last 3 Encounters:  03/12/16 (!) 323 lb 9.6 oz (146.8 kg)  02/28/16 (!) 325 lb (147.4 kg)  02/24/16 (!) 325 lb 8 oz (147.6 kg)    Gen: NAD, alert, cooperative with exam, NCAT EYES: EOMI, no conjunctival injection, or no icterus CV: NRRR, normal S1/S2, no murmur, distal pulses 2+ b/l Resp: CTABL, no wheezes, normal WOB Abd: +BS, soft, TTP R upper and lower abd, ND. no guarding or organomegaly Ext: No edema, warm Neuro: Alert and oriented, strength equal b/l UE and LE, coordination grossly normal MSK: normal muscle bulk Psych: normal affect, passive SI  Assessment & Plan:  Smt was seen today for follow-up multiple med problems.  Diagnoses and all orders for this visit:  Papilledema Normal MRI of brain Seen by neuro Has MRV head today following up with neuro for pseudotumor eval   Suicidal ideation Passive suicidal thoughts Pt calling to schedule counseling today Continues to have support from family Will let us know  if worsening -     citalopram (CELEXA) 40 MG tablet; Take 1 tablet (40 mg total) by mouth daily.  Moderate single current episode of major depressive disorder (HCC) Pt more pen to counseling today Increased citalopram to 40mg  does  Gallstones Has appt with surgery today  Right upper quadrant abdominal pain   Follow up plan: Return for 10/20 or 10/21 OK to schedule 15 min appt after hours with Dr. Oswaldo DoneVincent. Rex Krasarol Mourad Cwikla, MD [facility]

## 2016-03-12 NOTE — Patient Instructions (Signed)
[email or web address]

## 2016-03-13 ENCOUNTER — Ambulatory Visit: Payer: BLUE CROSS/BLUE SHIELD | Admitting: Neurology

## 2016-03-24 ENCOUNTER — Ambulatory Visit
Admission: RE | Admit: 2016-03-24 | Discharge: 2016-03-24 | Disposition: A | Discharge status: Home / Self Care | Payer: BLUE CROSS/BLUE SHIELD | Source: Ambulatory Visit | Attending: Neurology | Admitting: Neurology

## 2016-03-24 DIAGNOSIS — R51 Headache: Secondary | ICD-10-CM | POA: Diagnosis not present

## 2016-03-24 DIAGNOSIS — G44219 Episodic tension-type headache, not intractable: Secondary | ICD-10-CM

## 2016-03-24 DIAGNOSIS — H4710 Unspecified papilledema: Secondary | ICD-10-CM

## 2016-03-25 ENCOUNTER — Other Ambulatory Visit: Payer: Self-pay | Admitting: *Deleted

## 2016-03-25 DIAGNOSIS — G44219 Episodic tension-type headache, not intractable: Secondary | ICD-10-CM

## 2016-03-27 ENCOUNTER — Ambulatory Visit (INDEPENDENT_AMBULATORY_CARE_PROVIDER_SITE_OTHER): Payer: BLUE CROSS/BLUE SHIELD | Admitting: Pediatrics

## 2016-03-27 ENCOUNTER — Encounter: Payer: Self-pay | Admitting: Pediatrics

## 2016-03-27 ENCOUNTER — Ambulatory Visit: Payer: BLUE CROSS/BLUE SHIELD | Admitting: Pediatrics

## 2016-03-27 VITALS — BP 133/78 | HR 99 | Temp 98.4°F | Ht 68.0 in | Wt 330.2 lb

## 2016-03-27 DIAGNOSIS — F329 Major depressive disorder, single episode, unspecified: Secondary | ICD-10-CM

## 2016-03-27 DIAGNOSIS — F321 Major depressive disorder, single episode, moderate: Secondary | ICD-10-CM

## 2016-03-27 DIAGNOSIS — K8020 Calculus of gallbladder without cholecystitis without obstruction: Secondary | ICD-10-CM | POA: Diagnosis not present

## 2016-03-27 DIAGNOSIS — Z23 Encounter for immunization: Secondary | ICD-10-CM | POA: Diagnosis not present

## 2016-03-27 DIAGNOSIS — H4710 Unspecified papilledema: Secondary | ICD-10-CM

## 2016-03-27 DIAGNOSIS — F32A Depression, unspecified: Secondary | ICD-10-CM

## 2016-03-27 HISTORY — DX: Calculus of gallbladder without cholecystitis without obstruction: K80.20

## 2016-03-27 MED ORDER — CITALOPRAM HYDROBROMIDE 40 MG PO TABS: 40.0000 mg | ORAL_TABLET | Freq: Every day | ORAL | 1 refills | Status: DC

## 2016-03-27 NOTE — Progress Notes (Signed)
Subjective:   Patient ID: Rebecca Gardner, female    DOB: 14-Sep-1995, 20 y.o.   MRN: [id number] CC: Follow-up (Depression)  HPI: Rebecca Gardner is a 20 y.o. female presenting for Follow-up (Depression)  MRV normal Has spinal tap scheduled in next couple weeks to eval for pseudotumor cerebri Headaches have been tolerable Still worse with sneezing  Depression about the same Counseling appt this morning Went well, [place] in [place] Has appt next week Was helpful Has talked with her parents about depression They were more understanding than she expected They took away her gun, now locked away from her She lives in tiny house behind parents house Continues to have thoughts about not wanting to be here Discussed with counselor plan--call her office if daytime hours, call PCP, 911, or ask parents to take her to ED for safety eval if afterhours Pt says she is comfortable with the plan and would be able to ask for help if anything got worse  Has gall bladder surgery 04/10/2016 Some abd pain after eating Appetite has been down No pain now No fevers  [facility] 03/27/2016 03/12/2016 02/05/2016 01/22/2016 07/25/2015  Decreased Interest 2 2 2 2  0  Down, Depressed, Hopeless 2 2 1 1  0  PHQ - 2 Score 4 4 3 3  0  Altered sleeping - 2 1 2  -  Tired, decreased energy 2 2 2 2  -  Change in appetite 1 2 1 2  -  Feeling bad or failure about yourself  3 3 3 3  -  Trouble concentrating 1 2 1 1  -  Moving slowly or fidgety/restless 0 2 1 1  -  Suicidal thoughts 2 2 2 2  -  PHQ-9 Score - 19 14 16  -  Difficult doing work/chores Very difficult - Somewhat difficult Somewhat difficult -     Relevant past medical, surgical, family and social history reviewed. Allergies and medications reviewed and updated. History  Smoking Status  . Never Smoker  Smokeless Tobacco  . Never Used   ROS: Per HPI   Objective:    BP 133/78   Pulse 99   Temp 98.4 F (36.9 C) (Oral)   Ht 5\' 8"   (1.727 m)   Wt (!) 330 lb 3.2 oz (149.8 kg)   LMP 02/10/2016 (Exact Date)   BMI 50.21 kg/m   Wt Readings from Last 3 Encounters:  03/27/16 (!) 330 lb 3.2 oz (149.8 kg)  03/12/16 (!) 323 lb 9.6 oz (146.8 kg)  02/28/16 (!) 325 lb (147.4 kg)    Gen: NAD, alert, cooperative with exam, NCAT EYES: EOMI, no conjunctival injection, or no icterus CV: NRRR, normal S1/S2, no murmur, distal pulses 2+ b/l Resp: CTABL, no wheezes, normal WOB Abd: +BS, soft, mild epigastric tenderness, no guarding Psych: normal affect, mood "still down" about herself  Assessment & Plan:  Rebecca Gardner was seen today for follow-up.  Diagnoses and all orders for this visit:  Papilledema LP within next couple of weeks Following with neurology Headaches no worse MRV normal  Moderate single current episode of major depressive disorder (HCC) On higher dose for 2 weeks Continue, RTC 4 weeks Cont counseling Plan for worsening symptoms as above Pt feels safe at home, able to do plan if any worsening -     citalopram (CELEXA) 40 MG tablet; Take 1 tablet (40 mg total) by mouth daily.  Gall stones Surgery scheduled for 11/3 No fevers, let me know if worsening pain  Follow up plan: Return in about 4 weeks (  around 04/24/2016) for med follow up. Rex Kras, MD [place] [facility]

## 2016-04-06 ENCOUNTER — Other Ambulatory Visit: Payer: Self-pay | Admitting: *Deleted

## 2016-04-06 DIAGNOSIS — H4710 Unspecified papilledema: Secondary | ICD-10-CM

## 2016-04-09 ENCOUNTER — Encounter (HOSPITAL_COMMUNITY): Payer: Self-pay | Admitting: *Deleted

## 2016-04-09 NOTE — Progress Notes (Signed)
Pt denies SOB, chest pain, and being under the care of a cardiologist. Pt denies having a stress test, echo and cardiac cath. Pt made aware to stop taking  Aspirin, vitamins, fish oil and herbal medications. Do not take any NSAIDs ie: Ibuprofen, Advil, Naproxen, BC and Goody Powder or any medication containing Aspirin. Pt verbalized understanding of all pre-op instructions. 

## 2016-04-10 ENCOUNTER — Ambulatory Visit (HOSPITAL_COMMUNITY)
Admission: RE | Admit: 2016-04-10 | Discharge: 2016-04-10 | Disposition: A | Discharge status: Home / Self Care | Payer: BLUE CROSS/BLUE SHIELD | Source: Ambulatory Visit | Attending: General Surgery | Admitting: General Surgery

## 2016-04-10 ENCOUNTER — Ambulatory Visit (HOSPITAL_COMMUNITY): Payer: BLUE CROSS/BLUE SHIELD | Admitting: Certified Registered Nurse Anesthetist

## 2016-04-10 ENCOUNTER — Encounter (HOSPITAL_COMMUNITY)
Admission: RE | Disposition: A | Discharge status: Home / Self Care | Payer: Self-pay | Source: Ambulatory Visit | Attending: General Surgery

## 2016-04-10 ENCOUNTER — Other Ambulatory Visit: Payer: Self-pay | Admitting: General Surgery

## 2016-04-10 ENCOUNTER — Encounter (HOSPITAL_COMMUNITY): Payer: Self-pay | Admitting: Certified Registered Nurse Anesthetist

## 2016-04-10 DIAGNOSIS — Z79899 Other long term (current) drug therapy: Secondary | ICD-10-CM | POA: Diagnosis not present

## 2016-04-10 DIAGNOSIS — K219 Gastro-esophageal reflux disease without esophagitis: Secondary | ICD-10-CM | POA: Insufficient documentation

## 2016-04-10 DIAGNOSIS — F329 Major depressive disorder, single episode, unspecified: Secondary | ICD-10-CM | POA: Insufficient documentation

## 2016-04-10 DIAGNOSIS — Z8042 Family history of malignant neoplasm of prostate: Secondary | ICD-10-CM | POA: Insufficient documentation

## 2016-04-10 DIAGNOSIS — K8010 Calculus of gallbladder with chronic cholecystitis without obstruction: Secondary | ICD-10-CM | POA: Diagnosis not present

## 2016-04-10 DIAGNOSIS — Z803 Family history of malignant neoplasm of breast: Secondary | ICD-10-CM | POA: Insufficient documentation

## 2016-04-10 DIAGNOSIS — Z8249 Family history of ischemic heart disease and other diseases of the circulatory system: Secondary | ICD-10-CM | POA: Diagnosis not present

## 2016-04-10 DIAGNOSIS — K811 Chronic cholecystitis: Secondary | ICD-10-CM | POA: Diagnosis present

## 2016-04-10 DIAGNOSIS — R51 Headache: Secondary | ICD-10-CM | POA: Diagnosis not present

## 2016-04-10 DIAGNOSIS — H4710 Unspecified papilledema: Secondary | ICD-10-CM | POA: Diagnosis not present

## 2016-04-10 DIAGNOSIS — Z820 Family history of epilepsy and other diseases of the nervous system: Secondary | ICD-10-CM | POA: Diagnosis not present

## 2016-04-10 DIAGNOSIS — K824 Cholesterolosis of gallbladder: Secondary | ICD-10-CM | POA: Diagnosis not present

## 2016-04-10 HISTORY — DX: Calculus of bile duct without cholangitis or cholecystitis without obstruction: K80.50

## 2016-04-10 HISTORY — DX: Depression, unspecified: F32.A

## 2016-04-10 HISTORY — DX: Major depressive disorder, single episode, unspecified: F32.9

## 2016-04-10 HISTORY — PX: CHOLECYSTECTOMY: SHX55

## 2016-04-10 LAB — COMPREHENSIVE METABOLIC PANEL
ALT: 19 U/L (ref 14–54)
ANION GAP: 8 (ref 5–15)
AST: 17 U/L (ref 15–41)
Albumin: 3.5 g/dL (ref 3.5–5.0)
Alkaline Phosphatase: 50 U/L (ref 38–126)
BUN: 10 mg/dL (ref 6–20)
CALCIUM: 9.2 mg/dL (ref 8.9–10.3)
CHLORIDE: 108 mmol/L (ref 101–111)
CO2: 21 mmol/L — AB (ref 22–32)
Creatinine, Ser: 0.59 mg/dL (ref 0.44–1.00)
GFR calc non Af Amer: >60 mL/min (ref >60)
Glucose, Bld: 77 mg/dL (ref 65–99)
POTASSIUM: 3.7 mmol/L (ref 3.5–5.1)
SODIUM: 137 mmol/L (ref 135–145)
Total Bilirubin: 0.3 mg/dL (ref 0.3–1.2)
Total Protein: 7.1 g/dL (ref 6.5–8.1)

## 2016-04-10 LAB — CBC WITH DIFFERENTIAL/PLATELET
Basophils Absolute: 0 10*3/uL (ref 0.0–0.1)
Basophils Relative: 0 %
EOS PCT: 2 %
Eosinophils Absolute: 0.3 10*3/uL (ref 0.0–0.7)
HCT: 37.7 % (ref 36.0–46.0)
Hemoglobin: 12 g/dL (ref 12.0–15.0)
LYMPHS ABS: 3.7 10*3/uL (ref 0.7–4.0)
Lymphocytes Relative: 32 %
MCH: 24.9 pg — AB (ref 26.0–34.0)
MCHC: 31.8 g/dL (ref 30.0–36.0)
MCV: 78.4 fL (ref 78.0–100.0)
MONOS PCT: 6 %
Monocytes Absolute: 0.6 10*3/uL (ref 0.1–1.0)
Neutro Abs: 7 10*3/uL (ref 1.7–7.7)
Neutrophils Relative %: 60 %
PLATELETS: 314 10*3/uL (ref 150–400)
RBC: 4.81 MIL/uL (ref 3.87–5.11)
RDW: 14.6 % (ref 11.5–15.5)
WBC: 11.6 10*3/uL — ABNORMAL HIGH (ref 4.0–10.5)

## 2016-04-10 LAB — HCG, SERUM, QUALITATIVE: Preg, Serum: NEGATIVE

## 2016-04-10 SURGERY — LAPAROSCOPIC CHOLECYSTECTOMY
Anesthesia: General | Site: Abdomen

## 2016-04-10 MED ORDER — FENTANYL CITRATE (PF) 100 MCG/2ML IJ SOLN
INTRAMUSCULAR | Status: AC
  Filled 2016-04-10: qty 4

## 2016-04-10 MED ORDER — IBUPROFEN 800 MG PO TABS: 800.0000 mg | ORAL_TABLET | Freq: Three times a day (TID) | ORAL | 0 refills | Status: DC | PRN

## 2016-04-10 MED ORDER — ONDANSETRON HCL 4 MG/2ML IJ SOLN
INTRAMUSCULAR | Status: DC | PRN
  Administered 2016-04-10: 4 mg via INTRAVENOUS

## 2016-04-10 MED ORDER — CEFOTETAN DISODIUM-DEXTROSE 2-2.08 GM-% IV SOLR
2.0000 g | INTRAVENOUS | Status: AC
  Administered 2016-04-10: 2 g via INTRAVENOUS

## 2016-04-10 MED ORDER — PROPOFOL 10 MG/ML IV BOLUS
INTRAVENOUS | Status: AC
  Filled 2016-04-10: qty 20

## 2016-04-10 MED ORDER — HYDROCODONE-ACETAMINOPHEN 5-325 MG PO TABS: 1.0000 | ORAL_TABLET | Freq: Four times a day (QID) | ORAL | 0 refills | Status: DC | PRN

## 2016-04-10 MED ORDER — BUPIVACAINE HCL (PF) 0.25 % IJ SOLN
INTRAMUSCULAR | Status: AC
  Filled 2016-04-10: qty 30

## 2016-04-10 MED ORDER — BUPIVACAINE-EPINEPHRINE 0.25% -1:200000 IJ SOLN
INTRAMUSCULAR | Status: DC | PRN
  Administered 2016-04-10: 10 mL

## 2016-04-10 MED ORDER — EPHEDRINE 5 MG/ML INJ
INTRAVENOUS | Status: AC
  Filled 2016-04-10: qty 10

## 2016-04-10 MED ORDER — GLYCOPYRROLATE 0.2 MG/ML IV SOSY
PREFILLED_SYRINGE | INTRAVENOUS | Status: AC
  Filled 2016-04-10: qty 3

## 2016-04-10 MED ORDER — LIDOCAINE 2% (20 MG/ML) 5 ML SYRINGE
INTRAMUSCULAR | Status: AC
  Filled 2016-04-10: qty 10

## 2016-04-10 MED ORDER — PROMETHAZINE HCL 25 MG/ML IJ SOLN
INTRAMUSCULAR | Status: AC
  Filled 2016-04-10: qty 1

## 2016-04-10 MED ORDER — FENTANYL CITRATE (PF) 100 MCG/2ML IJ SOLN
INTRAMUSCULAR | Status: AC
  Filled 2016-04-10: qty 2

## 2016-04-10 MED ORDER — HYDROMORPHONE HCL 1 MG/ML IJ SOLN
INTRAMUSCULAR | Status: DC
Start: 2016-04-10 — End: 2016-04-10
  Filled 2016-04-10: qty 1

## 2016-04-10 MED ORDER — MIDAZOLAM HCL 2 MG/2ML IJ SOLN
INTRAMUSCULAR | Status: AC
  Filled 2016-04-10: qty 2

## 2016-04-10 MED ORDER — CELECOXIB 200 MG PO CAPS
400.0000 mg | ORAL_CAPSULE | ORAL | Status: AC
  Administered 2016-04-10: 400 mg via ORAL

## 2016-04-10 MED ORDER — KETOROLAC TROMETHAMINE 30 MG/ML IJ SOLN
INTRAMUSCULAR | Status: DC | PRN
  Administered 2016-04-10: 30 mg via INTRAVENOUS

## 2016-04-10 MED ORDER — CHLORHEXIDINE GLUCONATE CLOTH 2 % EX PADS: 6.0000 | MEDICATED_PAD | Freq: Once | CUTANEOUS | Status: DC

## 2016-04-10 MED ORDER — MIDAZOLAM HCL 5 MG/5ML IJ SOLN
INTRAMUSCULAR | Status: DC | PRN
  Administered 2016-04-10: 2 mg via INTRAVENOUS

## 2016-04-10 MED ORDER — SUCCINYLCHOLINE CHLORIDE 200 MG/10ML IV SOSY
PREFILLED_SYRINGE | INTRAVENOUS | Status: AC
  Filled 2016-04-10: qty 10

## 2016-04-10 MED ORDER — PHENYLEPHRINE 40 MCG/ML (10ML) SYRINGE FOR IV PUSH (FOR BLOOD PRESSURE SUPPORT)
PREFILLED_SYRINGE | INTRAVENOUS | Status: AC
  Filled 2016-04-10: qty 10

## 2016-04-10 MED ORDER — GABAPENTIN 300 MG PO CAPS
ORAL_CAPSULE | ORAL | Status: AC
  Filled 2016-04-10: qty 1

## 2016-04-10 MED ORDER — PROMETHAZINE HCL 25 MG/ML IJ SOLN
6.2500 mg | INTRAMUSCULAR | Status: DC | PRN
  Administered 2016-04-10: 6.25 mg via INTRAVENOUS

## 2016-04-10 MED ORDER — DEXAMETHASONE SODIUM PHOSPHATE 10 MG/ML IJ SOLN
INTRAMUSCULAR | Status: AC
  Filled 2016-04-10: qty 1

## 2016-04-10 MED ORDER — LACTATED RINGERS IV SOLN
INTRAVENOUS | Status: DC
  Administered 2016-04-10 (×3): via INTRAVENOUS

## 2016-04-10 MED ORDER — HYDROMORPHONE HCL 1 MG/ML IJ SOLN
INTRAMUSCULAR | Status: DC | PRN
  Administered 2016-04-10 (×2): 0.5 mg via INTRAVENOUS

## 2016-04-10 MED ORDER — SODIUM CHLORIDE 0.9 % IR SOLN
Status: DC | PRN
  Administered 2016-04-10: 1000 mL

## 2016-04-10 MED ORDER — SUCCINYLCHOLINE CHLORIDE 200 MG/10ML IV SOSY
PREFILLED_SYRINGE | INTRAVENOUS | Status: DC | PRN
  Administered 2016-04-10: 120 mg via INTRAVENOUS

## 2016-04-10 MED ORDER — LIDOCAINE 2% (20 MG/ML) 5 ML SYRINGE
INTRAMUSCULAR | Status: AC
  Filled 2016-04-10: qty 5

## 2016-04-10 MED ORDER — NEOSTIGMINE METHYLSULFATE 5 MG/5ML IV SOSY
PREFILLED_SYRINGE | INTRAVENOUS | Status: AC
  Filled 2016-04-10: qty 5

## 2016-04-10 MED ORDER — PROPOFOL 10 MG/ML IV BOLUS
INTRAVENOUS | Status: DC | PRN
  Administered 2016-04-10: 200 mg via INTRAVENOUS

## 2016-04-10 MED ORDER — HYDROMORPHONE HCL 1 MG/ML IJ SOLN
INTRAMUSCULAR | Status: AC
  Filled 2016-04-10: qty 1

## 2016-04-10 MED ORDER — ACETAMINOPHEN 500 MG PO TABS
1000.0000 mg | ORAL_TABLET | ORAL | Status: AC
  Administered 2016-04-10: 1000 mg via ORAL

## 2016-04-10 MED ORDER — GLYCOPYRROLATE 0.2 MG/ML IV SOSY
PREFILLED_SYRINGE | INTRAVENOUS | Status: DC | PRN
  Administered 2016-04-10: 0.6 mg via INTRAVENOUS

## 2016-04-10 MED ORDER — HYDROMORPHONE HCL 1 MG/ML IJ SOLN
0.2500 mg | INTRAMUSCULAR | Status: DC | PRN
  Administered 2016-04-10 (×2): 0.5 mg via INTRAVENOUS

## 2016-04-10 MED ORDER — ROCURONIUM BROMIDE 100 MG/10ML IV SOLN
INTRAVENOUS | Status: DC | PRN
  Administered 2016-04-10: 50 mg via INTRAVENOUS
  Administered 2016-04-10: 10 mg via INTRAVENOUS

## 2016-04-10 MED ORDER — CELECOXIB 200 MG PO CAPS
ORAL_CAPSULE | ORAL | Status: AC
  Filled 2016-04-10: qty 2

## 2016-04-10 MED ORDER — MEPERIDINE HCL 25 MG/ML IJ SOLN: 6.2500 mg | INTRAMUSCULAR | Status: DC | PRN

## 2016-04-10 MED ORDER — 0.9 % SODIUM CHLORIDE (POUR BTL) OPTIME
TOPICAL | Status: DC | PRN
  Administered 2016-04-10: 1000 mL

## 2016-04-10 MED ORDER — ONDANSETRON HCL 4 MG/2ML IJ SOLN
INTRAMUSCULAR | Status: AC
  Filled 2016-04-10: qty 2

## 2016-04-10 MED ORDER — ETOMIDATE 2 MG/ML IV SOLN
INTRAVENOUS | Status: AC
  Filled 2016-04-10: qty 10

## 2016-04-10 MED ORDER — ROCURONIUM BROMIDE 10 MG/ML (PF) SYRINGE
PREFILLED_SYRINGE | INTRAVENOUS | Status: AC
  Filled 2016-04-10: qty 30

## 2016-04-10 MED ORDER — NEOSTIGMINE METHYLSULFATE 5 MG/5ML IV SOSY
PREFILLED_SYRINGE | INTRAVENOUS | Status: DC | PRN
  Administered 2016-04-10: 4 mg via INTRAVENOUS

## 2016-04-10 MED ORDER — ACETAMINOPHEN 500 MG PO TABS
ORAL_TABLET | ORAL | Status: AC
  Filled 2016-04-10: qty 2

## 2016-04-10 MED ORDER — ROCURONIUM BROMIDE 10 MG/ML (PF) SYRINGE
PREFILLED_SYRINGE | INTRAVENOUS | Status: AC
  Filled 2016-04-10: qty 10

## 2016-04-10 MED ORDER — GABAPENTIN 300 MG PO CAPS
300.0000 mg | ORAL_CAPSULE | ORAL | Status: AC
  Administered 2016-04-10: 300 mg via ORAL

## 2016-04-10 MED ORDER — DEXAMETHASONE SODIUM PHOSPHATE 10 MG/ML IJ SOLN
INTRAMUSCULAR | Status: DC | PRN
  Administered 2016-04-10: 10 mg via INTRAVENOUS

## 2016-04-10 MED ORDER — CEFAZOLIN SODIUM 1 G IJ SOLR
INTRAMUSCULAR | Status: AC
  Filled 2016-04-10: qty 10

## 2016-04-10 MED ORDER — FENTANYL CITRATE (PF) 100 MCG/2ML IJ SOLN
INTRAMUSCULAR | Status: DC | PRN
  Administered 2016-04-10 (×3): 50 ug via INTRAVENOUS
  Administered 2016-04-10: 100 ug via INTRAVENOUS
  Administered 2016-04-10 (×3): 50 ug via INTRAVENOUS

## 2016-04-10 MED ORDER — LIDOCAINE HCL (CARDIAC) 20 MG/ML IV SOLN
INTRAVENOUS | Status: DC | PRN
  Administered 2016-04-10: 50 mg via INTRAVENOUS

## 2016-04-10 MED ORDER — LACTATED RINGERS IV SOLN: INTRAVENOUS | Status: DC

## 2016-04-10 MED ORDER — CEFOTETAN DISODIUM-DEXTROSE 2-2.08 GM-% IV SOLR
INTRAVENOUS | Status: AC
  Filled 2016-04-10: qty 50

## 2016-04-10 SURGICAL SUPPLY — 39 items
ADH SKN CLS APL DERMABOND .7 (GAUZE/BANDAGES/DRESSINGS) ×4
BAG SPEC RTRVL 10 TROC 200 (ENDOMECHANICALS) ×1
BLADE SURG ROTATE 9660 (MISCELLANEOUS) IMPLANT
CANISTER SUCTION 2500CC (MISCELLANEOUS) ×2 IMPLANT
CHLORAPREP W/TINT 26ML (MISCELLANEOUS) ×2 IMPLANT
CLIP LIGATING HEMO LOK XL GOLD (MISCELLANEOUS) ×3 IMPLANT
COVER SURGICAL LIGHT HANDLE (MISCELLANEOUS) ×2 IMPLANT
DERMABOND ADVANCED (GAUZE/BANDAGES/DRESSINGS) ×4
DERMABOND ADVANCED .7 DNX12 (GAUZE/BANDAGES/DRESSINGS) ×1 IMPLANT
ELECT REM PT RETURN 9FT ADLT (ELECTROSURGICAL) ×2
ELECTRODE REM PT RTRN 9FT ADLT (ELECTROSURGICAL) ×1 IMPLANT
GLOVE BIOGEL PI IND STRL 6.5 (GLOVE) ×1 IMPLANT
GLOVE BIOGEL PI IND STRL 7.0 (GLOVE) ×1 IMPLANT
GLOVE BIOGEL PI INDICATOR 6.5 (GLOVE) ×1
GLOVE BIOGEL PI INDICATOR 7.0 (GLOVE) ×2
GLOVE ECLIPSE 6.0 STRL STRAW (GLOVE) ×2 IMPLANT
GLOVE SURG SS PI 7.0 STRL IVOR (GLOVE) ×6 IMPLANT
GOWN STRL REUS W/ TWL LRG LVL3 (GOWN DISPOSABLE) ×3 IMPLANT
GOWN STRL REUS W/ TWL XL LVL3 (GOWN DISPOSABLE) IMPLANT
GOWN STRL REUS W/TWL LRG LVL3 (GOWN DISPOSABLE) ×6
GOWN STRL REUS W/TWL XL LVL3 (GOWN DISPOSABLE) ×2
GRASPER SUT TROCAR 14GX15 (MISCELLANEOUS) ×2 IMPLANT
KIT BASIN OR (CUSTOM PROCEDURE TRAY) ×2 IMPLANT
KIT ROOM TURNOVER OR (KITS) ×2 IMPLANT
NS IRRIG 1000ML POUR BTL (IV SOLUTION) ×2 IMPLANT
PAD ARMBOARD 7.5X6 YLW CONV (MISCELLANEOUS) ×2 IMPLANT
POUCH RETRIEVAL ECOSAC 10 (ENDOMECHANICALS) ×1 IMPLANT
POUCH RETRIEVAL ECOSAC 10MM (ENDOMECHANICALS) ×1
SCISSORS LAP 5X35 DISP (ENDOMECHANICALS) ×2 IMPLANT
SET IRRIG TUBING LAPAROSCOPIC (IRRIGATION / IRRIGATOR) ×2 IMPLANT
SLEEVE ENDOPATH XCEL 5M (ENDOMECHANICALS) ×4 IMPLANT
SPECIMEN JAR SMALL (MISCELLANEOUS) ×2 IMPLANT
SUT MNCRL AB 4-0 PS2 18 (SUTURE) ×2 IMPLANT
TOWEL OR 17X24 6PK STRL BLUE (TOWEL DISPOSABLE) ×2 IMPLANT
TOWEL OR 17X26 10 PK STRL BLUE (TOWEL DISPOSABLE) ×2 IMPLANT
TRAY LAPAROSCOPIC MC (CUSTOM PROCEDURE TRAY) ×2 IMPLANT
TROCAR XCEL 12X100 BLDLESS (ENDOMECHANICALS) ×2 IMPLANT
TROCAR XCEL NON-BLD 5MMX100MML (ENDOMECHANICALS) ×2 IMPLANT
TUBING INSUFFLATION (TUBING) ×2 IMPLANT

## 2016-04-10 NOTE — H&P (Signed)
Rebecca Gardner is an 20 y.o. female.   Chief Complaint: abdominal pain HPI: 20 yo female with abdominal pain consistent with biliary colic. She continues to have intermittent postprandial pain.  Past Medical History:  Diagnosis Date  . Acute lateral meniscus tear of left knee 02/02/2014  . Acute medial meniscus tear of left knee   . Biliary colic   . Concussion with brief (less than one hour) loss of consciousness 4-28 15  . Depression     Past Surgical History:  Procedure Laterality Date  . KNEE ARTHROSCOPY WITH LATERAL MENISECTOMY Left 02/02/2014   Procedure: LEFT KNEE ARTHROSCOPY WITH LATERAL MENISECTOMY;  Surgeon: Eulas PostJoshua P Landau, MD;  Location: [facility];  Service: Orthopedics;  Laterality: Left;  . TONSILLECTOMY      Family History  Problem Relation Age of Onset  . Breast cancer Paternal Grandmother   . Healthy Mother   . Atrial fibrillation Father   . Prostate cancer Paternal Grandfather   . Migraines Maternal Uncle    Social History:  reports that she has never smoked. She has never used smokeless tobacco. She reports that she does not drink alcohol or use drugs.  Allergies:  Allergies  Allergen Reactions  . No Known Allergies     Medications Prior to Admission  Medication Sig Dispense Refill  . citalopram (CELEXA) 40 MG tablet Take 1 tablet (40 mg total) by mouth daily. 30 tablet 1  . famotidine (PEPCID) 20 MG tablet Take 1 tablet (20 mg total) by mouth 2 (two) times daily. 60 tablet 3  . ibuprofen (ADVIL,MOTRIN) 200 MG tablet Take 400-800 mg by mouth daily as needed for headache.    . mupirocin ointment (BACTROBAN) 2 % Apply twice daily to draining areas (Patient taking differently: Apply twice daily to draining areas as needed for rash) 30 g 1  . nystatin ointment (MYCOSTATIN) Apply 1 application topically 2 (two) times daily. (Patient taking differently: Apply 1 application topically 2 (two) times daily as needed (rash). ) 30 g 1    Results for  orders placed or performed during the hospital encounter of 04/10/16 (from the past 48 hour(s))  CBC WITH DIFFERENTIAL     Status: Abnormal   Collection Time: 04/10/16 12:00 PM  Result Value Ref Range   WBC 11.6 (H) 4.0 - 10.5 K/uL   RBC 4.81 3.87 - 5.11 MIL/uL   Hemoglobin 12.0 12.0 - 15.0 g/dL   HCT 16.137.7 09.636.0 - 04.546.0 %   MCV 78.4 78.0 - 100.0 fL   MCH 24.9 (L) 26.0 - 34.0 pg   MCHC 31.8 30.0 - 36.0 g/dL   RDW 40.914.6 81.111.5 - 91.415.5 %   Platelets 314 150 - 400 K/uL   Neutrophils Relative % 60 %   Neutro Abs 7.0 1.7 - 7.7 K/uL   Lymphocytes Relative 32 %   Lymphs Abs 3.7 0.7 - 4.0 K/uL   Monocytes Relative 6 %   Monocytes Absolute 0.6 0.1 - 1.0 K/uL   Eosinophils Relative 2 %   Eosinophils Absolute 0.3 0.0 - 0.7 K/uL   Basophils Relative 0 %   Basophils Absolute 0.0 0.0 - 0.1 K/uL   No results found.  Review of Systems  Constitutional: Negative for chills and fever.  HENT: Negative for hearing loss.   Eyes: Negative for blurred vision and double vision.  Respiratory: Negative for cough and hemoptysis.   Cardiovascular: Negative for chest pain and palpitations.  Gastrointestinal: Positive for abdominal pain. Negative for nausea and vomiting.  Genitourinary: Negative for dysuria and urgency.  Musculoskeletal: Negative for myalgias and neck pain.  Skin: Negative for itching and rash.  Neurological: Negative for dizziness, tingling and headaches.  Endo/Heme/Allergies: Does not bruise/bleed easily.  Psychiatric/Behavioral: Negative for depression and suicidal ideas.    Blood pressure 138/77, pulse 84, temperature 97.7 F (36.5 C), temperature source Oral, resp. rate 20, height 5\' 8"  (1.727 m), weight (!) 149.8 kg (330 lb 3.2 oz), last menstrual period 04/04/2016, SpO2 99 %. Physical Exam  Nursing note and vitals reviewed. Constitutional: She is oriented to person, place, and time. She appears well-developed and well-nourished.  HENT:  Head: Normocephalic and atraumatic.  Eyes:  Conjunctivae and EOM are normal. No scleral icterus.  Neck: Normal range of motion. Neck supple.  Cardiovascular: Normal rate and regular rhythm.   Respiratory: Effort normal and breath sounds normal. She has no wheezes. She has no rales. She exhibits no tenderness.  GI: Soft. She exhibits no distension. There is no tenderness. There is no rebound.  Musculoskeletal: Normal range of motion. She exhibits no edema.  Neurological: She is alert and oriented to person, place, and time.  Skin: Skin is warm and dry.  Psychiatric: She has a normal mood and affect. Her behavior is normal.     Assessment/Plan 20 yo female with biliary colic -lap chole  Rodman PickleLuke Aaron Kinsinger, MD 04/10/2016, 12:49 PM

## 2016-04-10 NOTE — Op Note (Signed)
PATIENT:  Rebecca Gardner  20 y.o. female  PRE-OPERATIVE DIAGNOSIS:  chronic cholecystitis  POST-OPERATIVE DIAGNOSIS:  chronic cholecystitis  PROCEDURE:  Procedure(s): LAPAROSCOPIC CHOLECYSTECTOMY   SURGEON:  Surgeon(s): De BlanchLuke Aaron Trinika Cortese, MD  ASSISTANT: none  ANESTHESIA:   local and general  Indications for procedure: Rebecca SailorsKayla A Gardner is a 20 y.o. female with symptoms of Abdominal pain and RUQ pain consistent with gallbladder disease, Confirmed by Ultrasound.  Description of procedure: The patient was brought into the operative suite, placed supine. Anesthesia was administered with endotracheal tube. Patient was strapped in place and foot board was secured. All pressure points were offloaded by foam padding. The patient was prepped and draped in the usual sterile fashion.  A small incision was made to the right and superior of the umbilicus. A 5mm trocar was inserted into the peritoneal cavity with optical entry. Pneumoperitoneum was applied with high flow low pressure. 2 5mm trocars were placed in the RUQ. A 12mm trocar was placed in the subxiphoid space. All trocars sites were first anesthesized with 0.25% marcaine with epinephrine in the subcutaneous and preperitoneal layers. Next the patient was placed in reverse trendelenberg. The retroperitoneal fat mass put the right colon near the cystic triangle making direct visualization difficult, however, once retracted the entire gallbladder was able to be visualized.  The gallbladder was retracted cephalad and lateral. The peritoneum was reflected off the infundibulum working lateral to medial. The cystic duct and cystic artery were identified and further dissection revealed a critical view. The cystic duct and cystic artery were doubly clipped and ligated.   The gallbladder was removed off the liver bed with cautery. The Gallbladder was placed in a specimen bag. The gallbladder fossa was irrigated and hemostasis was applied with cautery. The  gallbladder was removed via the 12mm trocar. No dilation was required for removal, therefore no fascial closure was performed. Pneumoperitoneum was removed, all trocar were removed. All incisions were closed with 4-0 monocryl subcuticular stitch. The patient woke from anesthesia and was brought to PACU in stable condition. All counts were correct  Findings: multiple stones in gallbladder, normal ductal anatomy  Specimen: gallbladder  Blood loss: Total I/O In: 1000 [I.V.:1000] Out: -  ml  Local anesthesia: 10 ml 0.25% marcaine with epinephrine  Complications: none  PLAN OF CARE: Discharge to home after PACU  PATIENT DISPOSITION:  PACU - hemodynamically stable.  Feliciana RossettiLuke Sanayah Munro, M.D. General, Bariatric, & Minimally Invasive Surgery [facility]

## 2016-04-10 NOTE — Anesthesia Postprocedure Evaluation (Signed)
Anesthesia Post Note  Patient: Geet A Breeding  Procedure(s) Performed: Procedure(s) (LRB): LAPAROSCOPIC CHOLECYSTECTOMY (N/A)  Patient location during evaluation: PACU Anesthesia Type: General Level of consciousness: awake and alert Pain management: pain level controlled Vital Signs Assessment: post-procedure vital signs reviewed and stable Respiratory status: spontaneous breathing, nonlabored ventilation, respiratory function stable and patient connected to nasal cannula oxygen Cardiovascular status: blood pressure returned to baseline and stable Postop Assessment: no signs of nausea or vomiting Anesthetic complications: no    Last Vitals:  Vitals:   04/10/16 1515 04/10/16 1527  BP: 126/68 123/84  Pulse: 93 74  Resp: (!) 21 20  Temp: 36.6 C     Last Pain:  Vitals:   04/10/16 1515  TempSrc:   PainSc: Asleep                 Shelton SilvasKevin D Storm Sovine

## 2016-04-10 NOTE — Anesthesia Preprocedure Evaluation (Addendum)
Anesthesia Evaluation  Patient identified by MRN, date of birth, ID band Patient awake    Reviewed: Allergy & Precautions, NPO status , Patient's Chart, lab work & pertinent test results  Airway Mallampati: II  TM Distance: >3 FB Neck ROM: Full    Dental  (+) Teeth Intact, Dental Advisory Given   Pulmonary neg pulmonary ROS,    breath sounds clear to auscultation       Cardiovascular negative cardio ROS   Rhythm:Regular Rate:Normal     Neuro/Psych  Headaches, PSYCHIATRIC DISORDERS Depression    GI/Hepatic Neg liver ROS, GERD  Medicated,  Endo/Other  negative endocrine ROS  Renal/GU negative Renal ROS  negative genitourinary   Musculoskeletal negative musculoskeletal ROS (+)   Abdominal   Peds negative pediatric ROS (+)  Hematology negative hematology ROS (+)   Anesthesia Other Findings   Reproductive/Obstetrics negative OB ROS                            02/28/2016 EKG: sinus tachycardia.  Anesthesia Physical Anesthesia Plan  ASA: III  Anesthesia Plan: General   Post-op Pain Management:    Induction: Intravenous  Airway Management Planned: Oral ETT  Additional Equipment:   Intra-op Plan:   Post-operative Plan: Extubation in OR  Informed Consent: I have reviewed the patients History and Physical, chart, labs and discussed the procedure including the risks, benefits and alternatives for the proposed anesthesia with the patient or authorized representative who has indicated his/her understanding and acceptance.   Dental advisory given  Plan Discussed with: CRNA  Anesthesia Plan Comments:        Anesthesia Quick Evaluation

## 2016-04-10 NOTE — Transfer of Care (Signed)
Immediate Anesthesia Transfer of Care Note  Patient: Rebecca Gardner  Procedure(s) Performed: Procedure(s): LAPAROSCOPIC CHOLECYSTECTOMY (N/A)  Patient Location: PACU  Anesthesia Type:General  Level of Consciousness: patient cooperative and responds to stimulation  Airway & Oxygen Therapy: Patient Spontanous Breathing and Patient connected to nasal cannula oxygen  Post-op Assessment: Report given to RN and Post -op Vital signs reviewed and stable  Post vital signs: Reviewed and stable  Last Vitals:  Vitals:   04/10/16 1140 04/10/16 1440  BP: 138/77   Pulse: 84   Resp: 20   Temp: 36.5 C (P) 36.6 C    Last Pain:  Vitals:   04/10/16 1140  TempSrc: Oral      Patients Stated Pain Goal: 2 (04/10/16 1140)  Complications: No apparent anesthesia complications

## 2016-04-11 ENCOUNTER — Encounter (HOSPITAL_COMMUNITY): Payer: Self-pay | Admitting: General Surgery

## 2016-04-24 ENCOUNTER — Ambulatory Visit: Payer: BLUE CROSS/BLUE SHIELD | Admitting: Pediatrics

## 2016-05-07 DIAGNOSIS — Z114 Encounter for screening for human immunodeficiency virus [HIV]: Secondary | ICD-10-CM | POA: Diagnosis not present

## 2016-05-07 DIAGNOSIS — Z113 Encounter for screening for infections with a predominantly sexual mode of transmission: Secondary | ICD-10-CM | POA: Diagnosis not present

## 2016-05-11 ENCOUNTER — Ambulatory Visit: Payer: BLUE CROSS/BLUE SHIELD | Admitting: Pediatrics

## 2016-05-11 ENCOUNTER — Ambulatory Visit
Admission: RE | Admit: 2016-05-11 | Discharge: 2016-05-11 | Disposition: A | Discharge status: Home / Self Care | Payer: BLUE CROSS/BLUE SHIELD | Source: Ambulatory Visit | Attending: Neurology | Admitting: Neurology

## 2016-05-11 VITALS — BP 138/67 | HR 97

## 2016-05-11 DIAGNOSIS — H4710 Unspecified papilledema: Secondary | ICD-10-CM

## 2016-05-11 DIAGNOSIS — G44219 Episodic tension-type headache, not intractable: Secondary | ICD-10-CM

## 2016-05-11 DIAGNOSIS — R51 Headache: Secondary | ICD-10-CM | POA: Diagnosis not present

## 2016-05-11 LAB — CSF CELL COUNT WITH DIFFERENTIAL
RBC COUNT CSF: 2 {cells}/uL (ref 0–10)
WBC CSF: 0 {cells}/uL (ref 0–5)

## 2016-05-11 LAB — PROTEIN, CSF: Total Protein, CSF: 22 mg/dL (ref 15–45)

## 2016-05-11 LAB — GLUCOSE, CSF: Glucose, CSF: 66 mg/dL (ref 43–76)

## 2016-05-11 NOTE — Progress Notes (Signed)
[facility] without difficulty; site unremarkable.  Done by Victory Dakineb Dougherty, RN.

## 2016-05-11 NOTE — Discharge Instructions (Signed)

## 2016-05-14 ENCOUNTER — Ambulatory Visit (INDEPENDENT_AMBULATORY_CARE_PROVIDER_SITE_OTHER): Payer: BLUE CROSS/BLUE SHIELD | Admitting: Pediatrics

## 2016-05-14 VITALS — BP 155/93 | HR 97 | Temp 97.3°F | Ht 68.0 in | Wt 336.0 lb

## 2016-05-14 DIAGNOSIS — G43809 Other migraine, not intractable, without status migrainosus: Secondary | ICD-10-CM

## 2016-05-14 DIAGNOSIS — F32A Depression, unspecified: Secondary | ICD-10-CM

## 2016-05-14 DIAGNOSIS — F329 Major depressive disorder, single episode, unspecified: Secondary | ICD-10-CM

## 2016-05-14 LAB — CNS IGG SYNTHESIS RATE, CSF+BLOOD
Albumin, CSF: 9.4 mg/dL (ref 8.0–42.0)
Albumin, Serum(Neph): 4.1 g/dL (ref 3.7–5.1)
IGG INDEX, CSF: 0.67 — AB (ref <0.66)
IGG, CSF: 1.8 mg/dL (ref 0.8–7.7)
IGG, SERUM: 1180 mg/dL (ref 694–1618)
MS CNS IGG SYNTHESIS RATE: -1.8 mg/(24.h) (ref <=3.3)

## 2016-05-14 LAB — CSF CULTURE W GRAM STAIN
Gram Stain: NONE SEEN
Organism ID, Bacteria: NO GROWTH

## 2016-05-14 LAB — CSF CULTURE: GRAM STAIN: NONE SEEN

## 2016-05-14 MED ORDER — CITALOPRAM HYDROBROMIDE 40 MG PO TABS: 40.0000 mg | ORAL_TABLET | Freq: Every day | ORAL | 1 refills | Status: DC

## 2016-05-14 MED ORDER — RIZATRIPTAN BENZOATE 10 MG PO TBDP: 10.0000 mg | ORAL_TABLET | ORAL | 0 refills | Status: DC | PRN

## 2016-05-14 NOTE — Progress Notes (Signed)
Subjective:   Patient ID: Rebecca Gardner, female    DOB: 15-Jan-1996, 20 y.o.   MRN: [id number] CC: Medication Check and Headache (Spinal tap on Monday)  HPI: Rebecca Gardner is a 20 y.o. female presenting for Medication Check and Headache (Spinal tap on Monday)  Depression: Still seeing jessica in [place] for counseling Going well Thinks symptoms improving celexa has been helping at current dose Feeling much better Last thoughts of passive suicidal ideation over a month ago  HA: spinal tap three days ago No HA day after tap Pounding headache starting yesterday, continues today HA is over L eye and temple Does not hurt over entire head Has a history of migraines Is similar to prior migraines, more intense Headache improves when lying down, worsens when sits/stands Has not noticed any leaking  Sensation changes L leg since LP Feels more numb compared with R leg No saddle anesthesia No change in bladder/bowel function Sitting makes pain worse   [facility] 05/14/2016 03/27/2016 03/12/2016 02/05/2016 01/22/2016  Decreased Interest 2 2 2 2 2   Down, Depressed, Hopeless 2 2 2 1 1   PHQ - 2 Score 4 4 4 3 3   Altered sleeping 2 - 2 1 2   Tired, decreased energy 2 2 2 2 2   Change in appetite 0 1 2 1 2   Feeling bad or failure about yourself  2 3 3 3 3   Trouble concentrating 1 1 2 1 1   Moving slowly or fidgety/restless 1 0 2 1 1   Suicidal thoughts 1 2 2 2 2   PHQ-9 Score 13 - 19 14 16   Difficult doing work/chores Somewhat difficult Very difficult - Somewhat difficult Somewhat difficult     Relevant past medical, surgical, family and social history reviewed. Allergies and medications reviewed and updated. History  Smoking Status  . Never Smoker  Smokeless Tobacco  . Never Used   ROS: Per HPI   Objective:    BP (!) 155/93   Pulse 97   Temp 97.3 F (36.3 C) (Oral)   Ht 5\' 8"  (1.727 m)   Wt (!) 336 lb (152.4 kg)   LMP 04/27/2016   BMI 51.09 kg/m   Wt Readings  from Last 3 Encounters:  05/14/16 (!) 336 lb (152.4 kg)  04/10/16 (!) 330 lb 3.2 oz (149.8 kg)  03/27/16 (!) 330 lb 3.2 oz (149.8 kg)    Gen: NAD, alert, cooperative with exam, NCAT EYES: EOMI, no conjunctival injection, or no icterus ENT:  TMs pearly gray b/l, OP without erythema LYMPH: no cervical LAD CV: NRRR, normal S1/S2, no murmur, distal pulses 2+ b/l Resp: CTABL, no wheezes, normal WOB Abd: +BS, soft, NTND. no guarding or organomegaly Ext: No edema, warm Neuro: Alert and oriented, PERRL, strength equal b/l UE and LE, coordination grossly normal, altered/decreased sensation to light touch outer L upper leg compared with R, L lower leg compared with R. 1+ patellar reflex b/l  Assessment & Plan:  Rebecca Gardner was seen today for medication check and headache.  Diagnoses and all orders for this visit:  Other migraine without status migrainosus, not intractable Similar in some ways to normal headaches but more intense Improves with lying down +light sensitivity/sound Recent LP for papilledema, pt says she was told was normal result Has f/u with neurology within the next few weeks May be post-LP headache, can try below as abortive for migraine, pt to let neurologist know about ongoing headache if worsening -     rizatriptan (MAXALT-MLT) 10 MG disintegrating  tablet; Take 1 tablet (10 mg total) by mouth as needed for migraine. May repeat in 2 hours if needed  Depression, unspecified depression type Stable, in counseling Cont below -     citalopram (CELEXA) 40 MG tablet; Take 1 tablet (40 mg total) by mouth daily.  Follow up plan: Return in about 6 months (around 11/12/2016). Rebecca Krasarol Anasia Agro, MD [facility]

## 2016-05-29 ENCOUNTER — Encounter: Payer: Self-pay | Admitting: Neurology

## 2016-05-29 ENCOUNTER — Ambulatory Visit (INDEPENDENT_AMBULATORY_CARE_PROVIDER_SITE_OTHER): Payer: BLUE CROSS/BLUE SHIELD | Admitting: Neurology

## 2016-05-29 VITALS — BP 130/80 | HR 90 | Ht 68.0 in | Wt 333.2 lb

## 2016-05-29 DIAGNOSIS — G44219 Episodic tension-type headache, not intractable: Secondary | ICD-10-CM

## 2016-05-29 DIAGNOSIS — H4710 Unspecified papilledema: Secondary | ICD-10-CM

## 2016-05-29 NOTE — Progress Notes (Signed)
Follow-up Visit   Date: 05/29/16   Rebecca Gardner MRN: [id number] DOB: 1995/08/08   Interim History: Rebecca Gardner is a 20 y.o. right-handed Caucasian female with GERD, depression, and morbid obesity returning to the clinic for follow-up of papilledema.  The patient was accompanied to the clinic by paresnts who also provides collateral information.    History of present illness: Starting in May 2017, she began having blurred vision and headaches.  In August, she had her eyes checked by Dr. Anthony Sar ([place]) and was told she had bilateral papilledema as well a changes in visual acuity.  She has new prescriptions eye glasses which has helped with her vision changes.  Her headaches are dully, pounding, and bifrontal and radiate to become holocephalic.  Headaches occur 3-4 times per week and last all day, but if she takes ibuprofen, the duration is reduced to 2-3 hours.  She does not have associated nausea/vomiting, photophobia, or phonophobia.  Headaches are improved slightly when she lays supine.  Coughing and sneezing makes her headaches worse.     She had MRI of the brain and orbit which showed mild prominence of the perioptic nerve sheaths, consistent with papilledema.  No mass was seen of the orbit or brain.  She has been using a [place] IUD since January 2017.  She does not use tobacco.  UPDATE 05/29/2016:  She had one interval hospitalization for cholecystitis and underwent cholecystectomy in September 2017.  She continues to have headaches about 3-4 times week.  She does feel that headaches are not as intense, so she has limited her ibuprofen to once per week.  She has mild blurry vision with distance, no problems with near vision.  She denies double vision.  To evaluate her papilledema, she had MRV which was normal and CSF testing also showed normal opening pressure.  There is borderline elevation in her IgG index, which is nonspecific. She had a post-LP headache which  improved over a week and developed left leg numbness immediately followed the procedure, and this has also resolved.  She does not have any new neurological complaints.  Medications:  Current Outpatient Prescriptions on File Prior to Visit  Medication Sig Dispense Refill  . citalopram (CELEXA) 40 MG tablet Take 1 tablet (40 mg total) by mouth daily. 90 tablet 1  . famotidine (PEPCID) 20 MG tablet Take 1 tablet (20 mg total) by mouth 2 (two) times daily. 60 tablet 3  . HYDROcodone-acetaminophen (NORCO/VICODIN) 5-325 MG tablet Take 1-2 tablets by mouth every 6 (six) hours as needed for moderate pain. 30 tablet 0  . ibuprofen (ADVIL,MOTRIN) 800 MG tablet Take 1 tablet (800 mg total) by mouth every 8 (eight) hours as needed. 30 tablet 0  . mupirocin ointment (BACTROBAN) 2 % Apply twice daily to draining areas (Patient taking differently: Apply twice daily to draining areas as needed for rash) 30 g 1  . nystatin ointment (MYCOSTATIN) Apply 1 application topically 2 (two) times daily. (Patient taking differently: Apply 1 application topically 2 (two) times daily as needed (rash). ) 30 g 1  . rizatriptan (MAXALT-MLT) 10 MG disintegrating tablet Take 1 tablet (10 mg total) by mouth as needed for migraine. May repeat in 2 hours if needed 10 tablet 0   No current facility-administered medications on file prior to visit.     Allergies: No Known Allergies  Review of Systems:  CONSTITUTIONAL: No fevers, chills, night sweats, or weight loss.  EYES: +visual changes or eye  pain ENT: No hearing changes.  No history of nose bleeds.   RESPIRATORY: No cough, wheezing and shortness of breath.   CARDIOVASCULAR: Negative for chest pain, and palpitations.   GI: Negative for abdominal discomfort, blood in stools or black stools.  No recent change in bowel habits.   GU:  No history of incontinence.   MUSCLOSKELETAL: No history of joint pain or swelling.  No myalgias.   SKIN: Negative for lesions, rash, and itching.    ENDOCRINE: Negative for cold or heat intolerance, polydipsia or goiter.   PSYCH:  + depression or anxiety symptoms.   NEURO: As Above.   Vital Signs:  BP 130/80   Pulse 90   Ht 5' 8"  (1.727 m)   Wt (!) 333 lb 4 oz (151.2 kg)   LMP 04/27/2016   SpO2 98%   BMI 50.67 kg/m   Neurological Exam: MENTAL STATUS including orientation to time, place, person, recent and remote memory, attention span and concentration, language, and fund of knowledge is normal.  Speech is not dysarthric.  CRANIAL NERVES:  There is mild papilledema on the right eye, I do no appreciate this on the left.  No visual field defects.  Pupils equal round and reactive to light.  Normal conjugate, extra-ocular eye movements in all directions of gaze.  No ptosis.  MOTOR:  Motor strength is 5/5 in all extremities.    MSRs:  Reflexes are 2+/4 throughout.  SENSORY:  Intact to vibration throughout.  COORDINATION/GAIT:  Gait is wide-based due to body habitus. Stable.   Data: MRI orbit wwo contrast 02/18/2016:  Mild prominence of the perioptic nerve sheaths consistent with suspected papilledema. No orbital mass is seen.  MRI brain wwo contrast 02/17/2016:   Normal MRI appearance of the brain and orbits. No explanation for  Headaches.  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  I would like her to be re-evaluated with her eye doctor for formal testing, as I can only appreciate these changes on the right eye, not so much on the left.  At this juncture, I will await results of her updated eye exam.  If there is worsening papilledema or new visual symptoms, proceed with serology testing for autoimmune/inflammatory conditions to include: ESR, CRP, ANA, ACE, SSA/B.  In the meantime, will continue to follow her  clinically.  Episodic tension headaches, improved.  She is treating these headaches about once per week and the frequency of headaches does not warrant that she take a daily preventative medication.  If her headaches become more intense or frequent, recommend starting topiramate 2534maily.  Return to clinic in 6 months   The duration of this appointment visit was 30 minutes of face-to-face time with the patient.  Greater than 50% of this time was spent in counseling, explanation of diagnosis, planning of further management, and coordination of care.   Thank you for allowing me to participate in patient's care.  If I can answer any additional questions, I would be pleased to do so.    Sincerely,    Jabril Pursell K. PatPosey ProntoO

## 2016-05-29 NOTE — Patient Instructions (Signed)
Recommend that you see your eye doctor again for assessment of papilledema and visual fielding testing, if needed.  Return to clinic in 6 months

## 2016-06-11 ENCOUNTER — Encounter: Payer: Self-pay | Admitting: Obstetrics & Gynecology

## 2016-06-11 ENCOUNTER — Ambulatory Visit (INDEPENDENT_AMBULATORY_CARE_PROVIDER_SITE_OTHER): Payer: BLUE CROSS/BLUE SHIELD | Admitting: Obstetrics & Gynecology

## 2016-06-11 VITALS — BP 128/60 | HR 82 | Resp 14 | Ht 68.25 in | Wt 333.6 lb

## 2016-06-11 DIAGNOSIS — Z01419 Encounter for gynecological examination (general) (routine) without abnormal findings: Secondary | ICD-10-CM

## 2016-06-11 DIAGNOSIS — Z124 Encounter for screening for malignant neoplasm of cervix: Secondary | ICD-10-CM | POA: Diagnosis not present

## 2016-06-11 NOTE — Progress Notes (Signed)
21 y.o. G0P0000 SingleCaucasianF here for annual exam.  Had some vision issues last year.  Diagnosed with papilledema.  Has seen neurologist.  Had two MRIs and LP.  Has seen Dr. Nedra Hai, ophthalmologist.  Does have follow-up with Dr. Nedra Hai.    Had Kyleena placed 07/30/15.  Cycles are regular, lasting about 4-5 days.  Flow is not heavy.  Does not pass clots.  Does not have any cramping.  Very pleased with IUD.  Did HIV, GC/Chl [facility].  Pt reports this was all normal.  Results were sent to pt.    Patient's last menstrual period was 05/18/2016.          Sexually active: No.  The current method of family planning is IUD.    Exercising: No.  The patient does not participate in regular exercise at present. Smoker:  no  Health Maintenance: Pap:  n/a History of abnormal Pap:  n/a MMG:  never Colonoscopy:  never BMD:   never TDaP:  UTP Pneumonia vaccine(s):  never Zostavax:   never Hep C testing: not indicated Screening Labs: done recently at [facility], Hb today: same, Urine today: discuss with provider    reports that she has never smoked. She has never used smokeless tobacco. She reports that she does not drink alcohol or use drugs.  Past Medical History:  Diagnosis Date  . Acute lateral meniscus tear of left knee 02/02/2014  . Acute medial meniscus tear of left knee   . Biliary colic   . Concussion with brief (less than one hour) loss of consciousness 4-28 15  . Depression     Past Surgical History:  Procedure Laterality Date  . CHOLECYSTECTOMY N/A 04/10/2016   Procedure: LAPAROSCOPIC CHOLECYSTECTOMY;  Surgeon: Rodman Pickle, MD;  [facility];  Service: General;  Laterality: N/A;  . KNEE ARTHROSCOPY WITH LATERAL MENISECTOMY Left 02/02/2014   Procedure: LEFT KNEE ARTHROSCOPY WITH LATERAL MENISECTOMY;  Surgeon: Eulas Post, MD;  Location: [facility];  Service: Orthopedics;  Laterality: Left;  . TONSILLECTOMY      Current Outpatient  Prescriptions  Medication Sig Dispense Refill  . citalopram (CELEXA) 40 MG tablet Take 1 tablet (40 mg total) by mouth daily. 90 tablet 1  . famotidine (PEPCID) 20 MG tablet Take 1 tablet (20 mg total) by mouth 2 (two) times daily. 60 tablet 3  . HYDROcodone-acetaminophen (NORCO/VICODIN) 5-325 MG tablet Take 1-2 tablets by mouth every 6 (six) hours as needed for moderate pain. 30 tablet 0  . ibuprofen (ADVIL,MOTRIN) 800 MG tablet Take 1 tablet (800 mg total) by mouth every 8 (eight) hours as needed. 30 tablet 0  . Levonorgestrel (KYLEENA) 19.5 MG IUD by Intrauterine route.    . mupirocin ointment (BACTROBAN) 2 % Apply twice daily to draining areas (Patient taking differently: Apply twice daily to draining areas as needed for rash) 30 g 1  . nystatin ointment (MYCOSTATIN) Apply 1 application topically 2 (two) times daily. (Patient taking differently: Apply 1 application topically 2 (two) times daily as needed (rash). ) 30 g 1  . rizatriptan (MAXALT-MLT) 10 MG disintegrating tablet Take 1 tablet (10 mg total) by mouth as needed for migraine. May repeat in 2 hours if needed 10 tablet 0   No current facility-administered medications for this visit.     Family History  Problem Relation Age of Onset  . Breast cancer Paternal Grandmother   . Healthy Mother   . Atrial fibrillation Father   . Prostate  cancer Paternal Grandfather   . Migraines Maternal Uncle     ROS:  Pertinent items are noted in HPI.  Otherwise, a comprehensive ROS was negative.  Exam:   BP 128/60 (BP Location: Right Arm, Patient Position: Sitting, Cuff Size: Large)   Pulse 82   Resp 14   Ht 5' 8.25" (1.734 m)   Wt (!) 333 lb 9.6 oz (151.3 kg)   LMP 05/18/2016   BMI 50.35 kg/m   Weight change: +9#   Height: 5' 8.25" (173.4 cm)  Ht Readings from Last 3 Encounters:  06/11/16 5' 8.25" (1.734 m)  05/29/16 5\' 8"  (1.727 m)  05/14/16 5\' 8"  (1.727 m)   General appearance: alert, cooperative and appears stated age Head:  Normocephalic, without obvious abnormality, atraumatic Neck: no adenopathy, supple, symmetrical, trachea midline and thyroid normal to inspection and palpation Lungs: clear to auscultation bilaterally Breasts: normal appearance, no masses or tenderness Heart: regular rate and rhythm Abdomen: soft, non-tender; bowel sounds normal; no masses,  no organomegaly Extremities: extremities normal, atraumatic, no cyanosis or edema Skin: Skin color, texture, turgor normal. No rashes or lesions Lymph nodes: Cervical, supraclavicular, and axillary nodes normal. No abnormal inguinal nodes palpated Neurologic: Grossly normal   Pelvic: External genitalia:  no lesions              Urethra:  normal appearing urethra with no masses, tenderness or lesions              Bartholins and Skenes: normal                 Vagina: normal appearing vagina with normal color and discharge, no lesions              Cervix: no lesions and 2cm IUD string noted              Pap taken: Yes.   Bimanual Exam:  Uterus:  normal size, contour, position, consistency, mobility, non-tender              Adnexa: normal adnexa and no mass, fullness, tenderness               Rectovaginal: Confirms               Anus:  normal sphincter tone, no lesions  Chaperone was present for exam.  A:  Well Woman with normal exam Kyleena for contraception Not currently SA but had two partners this year.  [facility] cholecystectomy this past year H/O papilledema, being followed by ophthalmologist   P:   Mammogram guidelines reviewed pap smear only today Pt aware IUD removal by 07/29/2020 return annually or prn

## 2016-06-15 LAB — IPS PAP SMEAR ONLY

## 2016-07-06 ENCOUNTER — Encounter: Payer: Self-pay | Admitting: Pediatrics

## 2016-07-06 ENCOUNTER — Ambulatory Visit (INDEPENDENT_AMBULATORY_CARE_PROVIDER_SITE_OTHER): Payer: BLUE CROSS/BLUE SHIELD | Admitting: Pediatrics

## 2016-07-06 VITALS — BP 136/89 | HR 96 | Temp 98.6°F | Ht 68.0 in | Wt 333.6 lb

## 2016-07-06 DIAGNOSIS — B372 Candidiasis of skin and nail: Secondary | ICD-10-CM

## 2016-07-06 DIAGNOSIS — Z6841 Body Mass Index (BMI) 40.0 and over, adult: Secondary | ICD-10-CM

## 2016-07-06 DIAGNOSIS — IMO0001 Reserved for inherently not codable concepts without codable children: Secondary | ICD-10-CM

## 2016-07-06 DIAGNOSIS — F324 Major depressive disorder, single episode, in partial remission: Secondary | ICD-10-CM | POA: Diagnosis not present

## 2016-07-06 DIAGNOSIS — H4710 Unspecified papilledema: Secondary | ICD-10-CM | POA: Diagnosis not present

## 2016-07-06 DIAGNOSIS — R030 Elevated blood-pressure reading, without diagnosis of hypertension: Secondary | ICD-10-CM

## 2016-07-06 DIAGNOSIS — Z020 Encounter for examination for admission to educational institution: Secondary | ICD-10-CM

## 2016-07-06 DIAGNOSIS — E669 Obesity, unspecified: Secondary | ICD-10-CM

## 2016-07-06 LAB — BAYER DCA HB A1C WAIVED: HB A1C: 5.4 % (ref <7.0)

## 2016-07-06 MED ORDER — NYSTATIN 100000 UNIT/GM EX POWD: Freq: Four times a day (QID) | CUTANEOUS | 3 refills | Status: DC | PRN

## 2016-07-06 NOTE — Progress Notes (Signed)
  Subjective:   Patient ID: Rebecca Gardner, female    DOB: 21-Apr-1996, 21 y.o.   MRN: [id number] CC: follow up, forms to fill out for school  HPI: Rebecca Gardner is a 21 y.o. female presenting for follow up, forms to fill out for school  HA improving Has now 1-2 times a week Not as severe as what having before, "not to the point of migraine" Avoiding ibuprofen, can last for hours hasnt been back for eye exam yet  Elevated BP: At home 126/84 this morning Usually in 120s at home  Elevated BMI: Going to [employer] 4-5 days a week Trying to avoid snack foods  Depression: Has been better [place] a [place] to [place] No suicidal thoughts or thoughts of self harm Thinks going to gym regularly and getting back into school have been helping  Famotidine working well for GER  When hot, still sometimes will get rash along waist band, nystatin powder clears it up immediately No rash now  Relevant past medical, surgical, family and social history reviewed. Allergies and medications reviewed and updated. History  Smoking Status  . Never Smoker  Smokeless Tobacco  . Never Used   ROS: All systems neg other than what is in HPI  Objective:    BP 136/89   Pulse 96   Temp 98.6 F (37 C) (Oral)   Ht 5\' 8"  (1.727 m)   Wt (!) 333 lb 9.6 oz (151.3 kg)   BMI 50.72 kg/m   Wt Readings from Last 3 Encounters:  07/06/16 (!) 333 lb 9.6 oz (151.3 kg)  06/11/16 (!) 333 lb 9.6 oz (151.3 kg)  05/29/16 (!) 333 lb 4 oz (151.2 kg)   Gen: NAD, alert, cooperative with exam, NCAT EYES: EOMI, no conjunctival injection, or no icterus ENT:  TMs pearly gray b/l, OP without erythema LYMPH: no cervical LAD CV: NRRR, normal S1/S2, no murmur, distal pulses 2+ b/l Resp: CTABL, no wheezes, normal WOB Abd: +BS, soft, NTND. no guarding or organomegaly Ext: No edema, warm Neuro: Alert and oriented, strength equal b/l UE and LE, coordination grossly normal MSK: normal muscle bulk Psych: normal  affect, mood "pretty good", no thoughts of self harm  Assessment & Plan:  Rebecca Gardner was seen today for follow up, school forms  Diagnoses and all orders for this visit:  Major depressive disorder with single episode, in partial remission (HCC) Stable Cont celexa Cont counseling  Candidal skin infection Cont below prn -     nystatin (MYCOSTATIN/NYSTOP) powder; Apply topically 4 (four) times daily as needed.  Class 3 obesity with body mass index (BMI) of 50.0 to 59.9 in adult, unspecified obesity type, unspecified whether serious comorbidity present (HCC) TSH nl last summer Checking A1c, lipid panel today Discussed lifestyle changes Cont [employer] No snack foods, avoid sugary foods and rinks  Encounter for school examination Started phlebotomy program, fill out health forms  Elevated blood pressure reading Improved at home Cont to check at home Let m eknow if regularly elevated  Papilledema Followed by neurology, has had MRI, MRV, LP Needs f/u eye exam per neurology's last note Pt to call and schedule  Follow up plan: Return in about 6 months (around 01/03/2017). Rebecca Krasarol Vincent, MD [facility]

## 2016-07-07 LAB — LIPID PANEL
CHOLESTEROL TOTAL: 190 mg/dL (ref 100–199)
Chol/HDL Ratio: 4.3 ratio units (ref 0.0–4.4)
HDL: 44 mg/dL (ref >39)
LDL Calculated: 121 mg/dL — ABNORMAL HIGH (ref 0–99)
Triglycerides: 123 mg/dL (ref 0–149)
VLDL Cholesterol Cal: 25 mg/dL (ref 5–40)

## 2016-08-26 DIAGNOSIS — R509 Fever, unspecified: Secondary | ICD-10-CM | POA: Diagnosis not present

## 2016-08-26 DIAGNOSIS — R52 Pain, unspecified: Secondary | ICD-10-CM | POA: Diagnosis not present

## 2016-08-26 DIAGNOSIS — R6889 Other general symptoms and signs: Secondary | ICD-10-CM | POA: Diagnosis not present

## 2016-11-12 ENCOUNTER — Ambulatory Visit: Payer: BLUE CROSS/BLUE SHIELD | Admitting: Pediatrics

## 2016-11-20 ENCOUNTER — Ambulatory Visit (INDEPENDENT_AMBULATORY_CARE_PROVIDER_SITE_OTHER): Payer: BLUE CROSS/BLUE SHIELD | Admitting: Pediatrics

## 2016-11-20 ENCOUNTER — Encounter: Payer: Self-pay | Admitting: Pediatrics

## 2016-11-20 VITALS — BP 132/80 | HR 100 | Temp 97.8°F | Ht 68.0 in | Wt 337.4 lb

## 2016-11-20 DIAGNOSIS — Z6841 Body Mass Index (BMI) 40.0 and over, adult: Secondary | ICD-10-CM

## 2016-11-20 DIAGNOSIS — J069 Acute upper respiratory infection, unspecified: Secondary | ICD-10-CM

## 2016-11-20 DIAGNOSIS — H4710 Unspecified papilledema: Secondary | ICD-10-CM | POA: Diagnosis not present

## 2016-11-20 DIAGNOSIS — Z23 Encounter for immunization: Secondary | ICD-10-CM | POA: Diagnosis not present

## 2016-11-20 DIAGNOSIS — F329 Major depressive disorder, single episode, unspecified: Secondary | ICD-10-CM

## 2016-11-20 DIAGNOSIS — IMO0001 Reserved for inherently not codable concepts without codable children: Secondary | ICD-10-CM

## 2016-11-20 DIAGNOSIS — E669 Obesity, unspecified: Secondary | ICD-10-CM

## 2016-11-20 NOTE — Patient Instructions (Signed)
For sore throat and upper respiratory symptoms: Netipot with distilled water 2-3 times a day to clear out sinuses Or Normal saline nasal spray Flonase steroid nasal spray Antihistamine daily such as cetirizine Ibuprofen 600mg  three times a day Lots of fluids

## 2016-11-20 NOTE — Addendum Note (Signed)
Addended by: Caryl BisBOWMAN, Kaelei Wheeler M on: 11/20/2016 10:35 AM   Modules accepted: Orders

## 2016-11-20 NOTE — Progress Notes (Signed)
  Subjective:   Patient ID: Rebecca Gardner, female    DOB: 10-03-1995, 21 y.o.   MRN: [id number] CC: Follow-up (6 month) multiple med problems HPI: Rebecca Gardner is a 21 y.o. female presenting for Follow-up (6 month)  Depression: mood better No thoughts of self harm Not in counseling anymore Talking with mom regularly stopp celexa a couple months ago Made her feel numb  HA: wasn't having any until past few weeks, having a couple a week Going to school, stressed, and working third shift Has not been back to eye doctor to re-evaluate papilledema thinks she is having to use her glasses more often  In school for resp therapy  Avoiding EtoH, not smoking  Some sore throat for the past 3 days No fevers Some coughing Some throat clearing Normal appetitie Not taking anything for it at home  Elevated BMI: walking apprx 5 mi at night at work, working at [employer]  3 sodas a week Eating apprx 2 meals a day  Relevant past medical, surgical, family and social history reviewed. Allergies and medications reviewed and updated. History  Smoking Status  . Never Smoker  Smokeless Tobacco  . Never Used   ROS: Per HPI   Objective:    BP 132/80   Pulse 100   Temp 97.8 F (36.6 C) (Oral)   Ht 5\' 8"  (1.727 m)   Wt (!) 337 lb 6.4 oz (153 kg)   BMI 51.30 kg/m   Wt Readings from Last 3 Encounters:  11/20/16 (!) 337 lb 6.4 oz (153 kg)  07/06/16 (!) 333 lb 9.6 oz (151.3 kg)  06/11/16 (!) 333 lb 9.6 oz (151.3 kg)    Gen: NAD, alert, cooperative with exam, NCAT, slightly congested EYES: EOMI, no conjunctival injection, or no icterus ENT:  Clear effusion R TM, nl L TM. OP without erythema LYMPH: no cervical LAD CV: NRRR, normal S1/S2, no murmur, distal pulses 2+ b/l Resp: CTABL, no wheezes, normal WOB Ext: No edema, warm Neuro: Alert and oriented, strength equal b/l UE and LE, coordination grossly normal MSK: normal muscle bulk Psych: normal affect  Assessment & Plan:  Rebecca Gardner was seen  today for follow-up multiple med problems.  Diagnoses and all orders for this visit:  Papilledema Headaches recently returned, went away for several months Has not been back to eye doctor for recheck, pt to schedule for next week  Class 3 obesity with body mass index (BMI) of 50.0 to 59.9 in adult, unspecified obesity type, unspecified whether serious comorbidity present (HCC) Cont lifestyle changes, avoiding sugar, increase physical activity  Major depressive disorder with single episode, remission status unspecified Mood has been fine Off of celexa Treated for less than 6 mo If any worsening in mood will restart alternate SSRI  Acute URI Discussed symptom care, return precautions  Follow up plan: 6 mo, sooner if needed Rex Krasarol Vincent, MD [facility]

## 2016-11-24 ENCOUNTER — Ambulatory Visit (INDEPENDENT_AMBULATORY_CARE_PROVIDER_SITE_OTHER): Payer: BLUE CROSS/BLUE SHIELD | Admitting: Family

## 2016-11-24 ENCOUNTER — Encounter: Payer: Self-pay | Admitting: Family

## 2016-11-24 VITALS — BP 143/89 | HR 97 | Temp 98.8°F | Ht 68.0 in | Wt 337.0 lb

## 2016-11-24 DIAGNOSIS — M25561 Pain in right knee: Secondary | ICD-10-CM

## 2016-11-24 DIAGNOSIS — S83421A Sprain of lateral collateral ligament of right knee, initial encounter: Secondary | ICD-10-CM

## 2016-11-24 MED ORDER — NAPROXEN 500 MG PO TABS: 500.0000 mg | ORAL_TABLET | Freq: Two times a day (BID) | ORAL | 1 refills | Status: DC

## 2016-11-24 NOTE — Patient Instructions (Signed)
Lateral Collateral Knee Ligament Sprain The lateral collateral ligament (LCL) is a tough band of tissue that connects the thigh bone to the smaller of the lower leg bones. It is located on the outer side of the knee and it helps keep the knee stable. An LCL sprain is a stretch or tear in the LCL. What are the causes? This condition may be caused by:  A hard, direct hit (blow) to the outer side of your knee.  Running and changing directions quickly (cutting).  Twisting your knee forcefully.  What increases the risk? The following factors make you more likely to develop this condition:  Playing contact sports that involve cutting, such as football or soccer.  Participating in sports in which there is a risk of twisting the knee, like skiing or wrestling.  What are the signs or symptoms? Symptoms of this condition include:  A popping sound at the time of injury.  Pain on the outside of the knee.  Swelling in the knee.  Bruising around the knee.  Feeling unstable when you stand, like your knee will give way.  Difficulty walking on uneven surfaces.  How is this diagnosed? This condition may be diagnosed based on:  Your medical history.  A physical exam.  Tests, such as an X-ray or MRI.  During your physical exam, your health care provider will feel the side of your knee and check for stability by moving it. How is this treated? This condition may be treated by:  Keeping weight off the knee until swelling and pain improve.  Raising (elevating) the knee above the level of your heart. This helps to reduce swelling.  Icing the knee. This helps to reduce swelling.  Taking an NSAID. This helps to reduce pain and swelling.  Using a knee brace and crutches while the injury heals.  Using a knee brace when participating in athletic activities.  Doing rehab exercises (physical therapy).  Surgery. This may be needed if: ? Your LCL tore all the way through. ? Your knee is  unstable. ? Your knee is not getting better with other treatments.  Follow these instructions at home: If you have a brace:  Wear it as told by your health care provider. Remove it only as told by your health care provider.  Loosen the brace if your toes tingle, become numb, or turn cold and blue.  Do not let your brace get wet if it is not waterproof.  Keep the brace clean. Managing pain, stiffness, and swelling  If directed, apply ice to the outer side of your knee. ? Put ice in a plastic bag. ? Place a towel between your skin and the bag. ? Leave the ice on for 20 minutes, 2-3 times a day.  Move your foot and toes often to avoid stiffness and to lessen swelling.  Elevate your knee above the level of your heart while you are sitting or lying down. Driving  Ask your health care provider when it is safe to drive if you have a brace on your leg. Activity  Return to your normal activities as told by your health care provider. Ask your health care provider what activities are safe for you.  Do exercises as told by your health care provider. Safety  Do not use the injured limb to support your body weight until your health care provider says that you can. Use crutches as told by your health care provider. General instructions  Take over-the-counter and prescription medicines only as told  by your health care provider.  Keep all follow-up visits as told by your health care provider. This is important. How is this prevented?  Warm up and stretch before being active.  Cool down and stretch after being active.  Give your body time to rest between periods of activity.  Make sure to use equipment that fits you.  Be safe and responsible while being active to avoid falls.  Do at least 150 minutes of moderate-intensity exercise each week, such as brisk walking or water aerobics.  Maintain physical fitness, including: ? Strength. ? Flexibility. ? Cardiovascular  fitness. ? Endurance. Contact a health care provider if:  You continue to have pain and swelling for 2-4 weeks.  Your knee feels unstable or gives way. This information is not intended to replace advice given to you by your health care provider. Make sure you discuss any questions you have with your health care provider. Document Released: 09/15/2005 Document Revised: 01/28/2016 Document Reviewed: 04/03/2015 Elsevier Interactive Patient Education  [employer].

## 2016-11-24 NOTE — Progress Notes (Signed)
   Subjective:    Patient ID: Trey SailorsKayla A Fuerstenberg, female    DOB: March 04, 1996, 21 y.o.   MRN: [id number]  Knee Pain   The incident occurred 12 to 24 hours ago. The incident occurred at the gym. The injury mechanism was an inversion injury. The pain is present in the right knee. The quality of the pain is described as aching. The pain is at a severity of 9/10. The pain is moderate. The pain has been constant since onset. Pertinent negatives include no muscle weakness, numbness or tingling. She reports no foreign bodies present. The symptoms are aggravated by movement and weight bearing. She has tried rest, NSAIDs and ice for the symptoms. The treatment provided mild relief.      Review of Systems  Musculoskeletal: Positive for joint swelling.  Neurological: Negative for tingling and numbness.  All other systems reviewed and are negative.      Objective:   Physical Exam  Constitutional: She is oriented to person, place, and time. She appears well-developed and well-nourished. No distress.  HENT:  Head: Normocephalic.  Cardiovascular: Normal rate, regular rhythm, normal heart sounds and intact distal pulses.   No murmur heard. Pulmonary/Chest: Effort normal and breath sounds normal. No respiratory distress. She has no wheezes.  Abdominal: Soft. Bowel sounds are normal. She exhibits no distension. There is no tenderness.  Musculoskeletal: Normal range of motion. She exhibits edema and tenderness.  Lateral right knee pain with flexion and extension, trace edema present   Neurological: She is alert and oriented to person, place, and time.  Skin: Skin is warm and dry.  Psychiatric: She has a normal mood and affect. Her behavior is normal. Judgment and thought content normal.  Vitals reviewed.     BP (!) 142/93   Pulse 92   Temp 98.8 F (37.1 C) (Oral)   Ht 5\' 8"  (1.727 m)   Wt (!) 337 lb (152.9 kg)   BMI 51.24 kg/m      Assessment & Plan:  1. Acute pain of right knee - naproxen  (NAPROSYN) 500 MG tablet; Take 1 tablet (500 mg total) by mouth 2 (two) times daily with a meal.  Dispense: 60 tablet; Refill: 1  2. Sprain of lateral collateral ligament of right knee, initial encounter Rest Ice Elevated Take Naprosyn BID for next 7 days with food If pain does not improve or worsen RTO - naproxen (NAPROSYN) 500 MG tablet; Take 1 tablet (500 mg total) by mouth 2 (two) times daily with a meal.  Dispense: 60 tablet; Refill: 1   Jannifer Rodneyhristy Ifeoma Vallin, FNP

## 2016-11-27 ENCOUNTER — Ambulatory Visit (INDEPENDENT_AMBULATORY_CARE_PROVIDER_SITE_OTHER): Payer: BLUE CROSS/BLUE SHIELD | Admitting: Neurology

## 2016-11-27 ENCOUNTER — Encounter: Payer: Self-pay | Admitting: Neurology

## 2016-11-27 VITALS — BP 130/80 | HR 101 | Ht 68.0 in | Wt 337.1 lb

## 2016-11-27 DIAGNOSIS — G44219 Episodic tension-type headache, not intractable: Secondary | ICD-10-CM

## 2016-11-27 DIAGNOSIS — H4710 Unspecified papilledema: Secondary | ICD-10-CM | POA: Diagnosis not present

## 2016-11-27 NOTE — Patient Instructions (Signed)
Follow-up with your eye doctor for repeat exam  Return to clinic as needed

## 2016-11-27 NOTE — Progress Notes (Signed)
Follow-up Visit   Date: 11/27/16   Rebecca Gardner MRN: [id number] DOB: [birth year]   Interim History: Rebecca Gardner is a 21 y.o. right-handed Caucasian female with GERD, depression, and morbid obesity returning to the clinic for follow-up of papilledema.  The patient was accompanied to the clinic by paresnts who also provides collateral information.    History of present illness: Starting in May 2017, she began having blurred vision and headaches.  In August, she had her eyes checked by Dr. Anthony Sar ([place]) and was told she had bilateral papilledema as well a changes in visual acuity.  She has new prescriptions eye glasses which has helped with her vision changes.  Her headaches are dully, pounding, and bifrontal and radiate to become holocephalic.  Headaches occur 3-4 times per week and last all day, but if she takes ibuprofen, the duration is reduced to 2-3 hours.  She does not have associated nausea/vomiting, photophobia, or phonophobia.  Headaches are improved slightly when she lays supine.  Coughing and sneezing makes her headaches worse.     She had MRI of the brain and orbit which showed mild prominence of the perioptic nerve sheaths, consistent with papilledema.  No mass was seen of the orbit or brain.  She has been using a [place] IUD since January 2017.  She does not use tobacco.  UPDATE 05/29/2016:  She had one interval hospitalization for cholecystitis and underwent cholecystectomy in September 2017.  She continues to have headaches about 3-4 times week.  She does feel that headaches are not as intense, so she has limited her ibuprofen to once per week.  She has mild blurry vision with distance, no problems with near vision.  She denies double vision.  To evaluate her papilledema, she had MRV which was normal and CSF testing also showed normal opening pressure.  There is borderline elevation in her IgG index, which is nonspecific. She had a post-LP headache which  improved over a week and developed left leg numbness immediately followed the procedure, and this has also resolved.    UPDATE 11/27/2016:  Patient is here for 6 month follow-up.  Her headaches are doing much better, now occurring 3 times per month and much less severe that often she does not treat them. At her last visit, I recommended that she see her ophthalmologist again to assess her papilledema, but she has not done this.  Her vision has been a little blurry and she has been wearing corrective lenses as needed which helps.  She does not have any new neurological complaints. 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  She is hoping to enroll in the respiratory therapy program.  Medications:  Current Outpatient Prescriptions on File Prior to Visit  Medication Sig Dispense Refill  . Levonorgestrel (KYLEENA) 19.5 MG IUD by Intrauterine route.    . naproxen (NAPROSYN) 500 MG tablet Take 1 tablet (500 mg total) by mouth 2 (two) times daily with a meal. 60 tablet 1  . nystatin (MYCOSTATIN/NYSTOP) powder Apply topically 4 (four) times daily as needed. 15 g 3  . rizatriptan (MAXALT-MLT) 10 MG disintegrating tablet Take 1 tablet (10 mg total) by mouth as needed for migraine. May repeat in 2 hours if needed 10 tablet 0   No current facility-administered medications on file prior to visit.     Allergies: No Known Allergies  Review of Systems:  CONSTITUTIONAL: No fevers, chills,  night sweats, or weight loss.  EYES: +visual changes or eye pain ENT: No hearing changes.  No history of nose bleeds.   RESPIRATORY: No cough, wheezing and shortness of breath.   CARDIOVASCULAR: Negative for chest pain, and palpitations.   GI: Negative for abdominal discomfort, blood in stools or black stools.  No recent change in bowel habits.   GU:  No history of incontinence.   MUSCLOSKELETAL: No history of joint pain or swelling.  No myalgias.   SKIN: Negative  for lesions, rash, and itching.   ENDOCRINE: Negative for cold or heat intolerance, polydipsia or goiter.   PSYCH:  + depression or anxiety symptoms.   NEURO: As Above.   Vital Signs:  BP 130/80   Pulse (!) 101   Ht 5' 8"  (1.727 m)   Wt (!) 337 lb 2 oz (152.9 kg)   SpO2 98%   BMI 51.26 kg/m   Neurological Exam: MENTAL STATUS including orientation to time, place, person, recent and remote memory, attention span and concentration, language, and fund of knowledge is normal.  Speech is not dysarthric.  CRANIAL NERVES:  There is mild papilledema on the right eye, none on the left.  No visual field defects.  Pupils equal round and reactive to light.  Normal conjugate, extra-ocular eye movements in all directions of gaze.  No ptosis.  MOTOR:  Motor strength is 5/5 in all extremities.    MSRs:  Reflexes are 2+/4 throughout.  SENSORY:  Intact to vibration throughout.  COORDINATION/GAIT:  Gait is wide-based due to body habitus. Stable.   Data: MRI orbit wwo contrast 02/18/2016:  Mild prominence of the perioptic nerve sheaths consistent with suspected papilledema. No orbital mass is seen.  MRI brain wwo contrast 02/17/2016:   Normal MRI appearance of the brain and orbits. No explanation for  Headaches.  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  I want her to be reevaluated by her eye doctor to see if this could be pseudopapilledema. At this juncture, I will await results of her updated eye exam.  If there is worsening papilledema, proceed with serology testing for autoimmune/inflammatory conditions to include: ESR, CRP, ANA, ACE, SSA/B.  In the meantime, will continue to follow her clinically.  Episodic tension headaches, improved and only occurring 3 times per month and much less  intensity.  Return to clinic after her opthalmologic evaluation  The duration of this appointment visit was 20 minutes of face-to-face time with the patient.  Greater than 50% of this time was spent in counseling, explanation of diagnosis, planning of further management, and coordination of care.   Thank you for allowing me to participate in patient's care.  If I can answer any additional questions, I would be pleased to do so.    Sincerely,    Donika K. PaPosey ProntoDO

## 2016-12-29 ENCOUNTER — Telehealth: Payer: Self-pay | Admitting: Pediatrics

## 2016-12-30 NOTE — Telephone Encounter (Signed)
Shot record picked up

## 2017-02-05 ENCOUNTER — Ambulatory Visit (INDEPENDENT_AMBULATORY_CARE_PROVIDER_SITE_OTHER): Payer: BLUE CROSS/BLUE SHIELD | Admitting: Family

## 2017-02-05 ENCOUNTER — Encounter: Payer: Self-pay | Admitting: Family

## 2017-02-05 VITALS — BP 124/86 | HR 85 | Temp 97.1°F | Ht 68.0 in | Wt 335.6 lb

## 2017-02-05 DIAGNOSIS — Z020 Encounter for examination for admission to educational institution: Secondary | ICD-10-CM | POA: Diagnosis not present

## 2017-02-05 NOTE — Patient Instructions (Signed)

## 2017-02-05 NOTE — Progress Notes (Signed)
   Subjective:    Patient ID: Rebecca Gardner, female    DOB: 1995/11/25, 21 y.o.   MRN: [id number]  HPI PT presents to the office today for titer. PT is starting Respiratory Therapy program at [facility].  Pt states all of her immunizations are up to date. Pt denies any headache, palpitations, SOB, or edema at this time.     Review of Systems  All other systems reviewed and are negative.      Objective:   Physical Exam  Constitutional: She is oriented to person, place, and time. She appears well-developed and well-nourished. No distress.  Morbid obese   HENT:  Head: Normocephalic and atraumatic.  Eyes: Pupils are equal, round, and reactive to light.  Neck: Normal range of motion. Neck supple. No thyromegaly present.  Cardiovascular: Normal rate, regular rhythm, normal heart sounds and intact distal pulses.   No murmur heard. Pulmonary/Chest: Effort normal and breath sounds normal. No respiratory distress. She has no wheezes.  Abdominal: Soft. Bowel sounds are normal. She exhibits no distension. There is no tenderness.  Musculoskeletal: Normal range of motion. She exhibits no edema or tenderness.  Neurological: She is alert and oriented to person, place, and time.  Skin: Skin is warm and dry.  Psychiatric: She has a normal mood and affect. Her behavior is normal. Judgment and thought content normal.  Vitals reviewed.     BP 124/86   Pulse 85   Temp (!) 97.1 F (36.2 C) (Oral)   Ht 5\' 8"  (1.727 m)   Wt (!) 335 lb 9.6 oz (152.2 kg)   BMI 51.03 kg/m      Assessment & Plan:  1. Encounter for school examination Good luck in school!! Health maintenance discussed RTO prn and keep follow up with PCP - Measles/Mumps/Rubella Immunity - Varicella zoster antibody, IgG   Jannifer Rodneyhristy Hawks, FNP

## 2017-02-06 LAB — MEASLES/MUMPS/RUBELLA IMMUNITY
MUMPS ABS, IGG: 40.3 [AU]/ml (ref >10.9)
RUBEOLA AB, IGG: <25 AU/mL — ABNORMAL LOW (ref >29.9)
Rubella Antibodies, IGG: 0.94 index — ABNORMAL LOW (ref >0.99)

## 2017-02-06 LAB — VARICELLA ZOSTER ANTIBODY, IGG: Varicella zoster IgG: 2374 index (ref >165)

## 2017-02-10 ENCOUNTER — Ambulatory Visit (INDEPENDENT_AMBULATORY_CARE_PROVIDER_SITE_OTHER): Payer: BLUE CROSS/BLUE SHIELD | Admitting: *Deleted

## 2017-02-10 DIAGNOSIS — Z23 Encounter for immunization: Secondary | ICD-10-CM

## 2017-02-12 NOTE — Progress Notes (Signed)
Pt given MMR Tolerated well 

## 2017-05-24 ENCOUNTER — Ambulatory Visit: Payer: BLUE CROSS/BLUE SHIELD | Admitting: Pediatrics

## 2017-05-27 ENCOUNTER — Ambulatory Visit: Payer: BLUE CROSS/BLUE SHIELD | Admitting: Pediatrics

## 2017-06-10 ENCOUNTER — Ambulatory Visit: Payer: BLUE CROSS/BLUE SHIELD | Admitting: Pediatrics

## 2017-06-10 ENCOUNTER — Encounter: Payer: Self-pay | Admitting: Pediatrics

## 2017-06-10 VITALS — BP 144/89 | HR 106 | Temp 98.4°F | Ht 68.0 in | Wt 341.4 lb

## 2017-06-10 DIAGNOSIS — Z020 Encounter for examination for admission to educational institution: Secondary | ICD-10-CM

## 2017-06-10 DIAGNOSIS — R030 Elevated blood-pressure reading, without diagnosis of hypertension: Secondary | ICD-10-CM

## 2017-06-10 DIAGNOSIS — Z6841 Body Mass Index (BMI) 40.0 and over, adult: Secondary | ICD-10-CM | POA: Diagnosis not present

## 2017-06-10 DIAGNOSIS — Z0000 Encounter for general adult medical examination without abnormal findings: Secondary | ICD-10-CM

## 2017-06-10 LAB — BAYER DCA HB A1C WAIVED: HB A1C: 5.4 % (ref <7.0)

## 2017-06-10 NOTE — Progress Notes (Signed)
  Subjective:   Patient ID: Rebecca Gardner, female    DOB: 1996/05/22, 22 y.o.   MRN: [id number] CC: Follow-up (6 month) annual exam HPI: Rebecca Gardner is a 22 y.o. female presenting for Follow-up (6 month)  Elevated blood pressure: at work is 120s/70s  Taking 21 credit hours, in respiratory classes Has been enjoying it, is stressful at   Mood has been fine, stressed but fine she says Walking with another coworker, getting about 74m on her apple watch at work  [employer]-3 times a week driving to work, fries, grilled chicken, sweet tea  Relevant past medical, surgical, family and social history reviewed. Allergies and medications reviewed and updated. Social History   Tobacco Use  Smoking Status Never Smoker  Smokeless Tobacco Never Used   ROS: All systems neg other than what is in HPI  Objective:    BP (!) 144/89   Pulse (!) 106   Temp 98.4 F (36.9 C) (Oral)   Ht '5\' 8"'$  (1.727 m)   Wt (!) 341 lb 6.4 oz (154.9 kg)   BMI 51.91 kg/m   Wt Readings from Last 3 Encounters:  06/10/17 (!) 341 lb 6.4 oz (154.9 kg)  02/05/17 (!) 335 lb 9.6 oz (152.2 kg)  11/27/16 (!) 337 lb 2 oz (152.9 kg)    Gen: NAD, alert, cooperative with exam, NCAT EYES: EOMI, no conjunctival injection, or no icterus ENT:  OP without erythema LYMPH: no cervical LAD CV: NRRR, normal S1/S2, no murmur, distal pulses 2+ b/l Resp: CTABL, no wheezes, normal WOB Abd: +BS, soft, NTND. no guarding or organomegaly Ext: No edema, warm Neuro: Alert and oriented, strength equal b/l UE and LE, coordination grossly normal MSK: normal muscle bulk  Assessment & Plan:  KWhittneywas seen today for annual exam.  Diagnoses and all orders for this visit:  Encounter for preventive health examination  Encounter for school examination -     QuantiFERON-TB Gold Plus  BMI 50.0-59.9, adult ([place] Obesity Discussed lifestyle, diet changes Goal for 5 lb weight loss over next 2 months -     CMP14+EGFR -     Bayer  DCA Hb A1c Waived  Elevated BP Check numbers at work, rtc 4 weeks for recheck  Follow up plan: Return in about 4 weeks (around 07/08/2017). CAssunta Found MD [facility]

## 2017-06-10 NOTE — Patient Instructions (Signed)
Check blood pressures at work or at home, write numbers down and bring to next office visit

## 2017-06-12 LAB — QUANTIFERON-TB GOLD PLUS
QUANTIFERON TB1 AG VALUE: 0.03 [IU]/mL
QUANTIFERON TB2 AG VALUE: 0.02 [IU]/mL
QuantiFERON Mitogen Value: >10 IU/mL
QuantiFERON Nil Value: 0.02 IU/mL
QuantiFERON-TB Gold Plus: NEGATIVE

## 2017-06-12 LAB — CMP14+EGFR
A/G RATIO: 1.5 (ref 1.2–2.2)
ALK PHOS: 53 IU/L (ref 39–117)
ALT: 25 IU/L (ref 0–32)
AST: 17 IU/L (ref 0–40)
Albumin: 4.1 g/dL (ref 3.5–5.5)
BILIRUBIN TOTAL: 0.2 mg/dL (ref 0.0–1.2)
BUN / CREAT RATIO: 11 (ref 9–23)
BUN: 7 mg/dL (ref 6–20)
CHLORIDE: 104 mmol/L (ref 96–106)
CO2: 20 mmol/L (ref 20–29)
Calcium: 9.5 mg/dL (ref 8.7–10.2)
Creatinine, Ser: 0.63 mg/dL (ref 0.57–1.00)
GFR calc non Af Amer: 128 mL/min/{1.73_m2} (ref >59)
GFR, EST AFRICAN AMERICAN: 147 mL/min/{1.73_m2} (ref >59)
GLUCOSE: 93 mg/dL (ref 65–99)
Globulin, Total: 2.7 g/dL (ref 1.5–4.5)
POTASSIUM: 4.4 mmol/L (ref 3.5–5.2)
Sodium: 139 mmol/L (ref 134–144)
TOTAL PROTEIN: 6.8 g/dL (ref 6.0–8.5)

## 2017-07-15 ENCOUNTER — Ambulatory Visit: Payer: BLUE CROSS/BLUE SHIELD | Admitting: Pediatrics

## 2017-07-15 ENCOUNTER — Encounter: Payer: Self-pay | Admitting: Pediatrics

## 2017-07-15 VITALS — BP 138/88 | HR 105 | Temp 98.2°F | Ht 68.0 in | Wt 343.8 lb

## 2017-07-15 DIAGNOSIS — J029 Acute pharyngitis, unspecified: Secondary | ICD-10-CM | POA: Diagnosis not present

## 2017-07-15 DIAGNOSIS — J020 Streptococcal pharyngitis: Secondary | ICD-10-CM | POA: Diagnosis not present

## 2017-07-15 DIAGNOSIS — R52 Pain, unspecified: Secondary | ICD-10-CM | POA: Diagnosis not present

## 2017-07-15 LAB — VERITOR FLU A/B WAIVED
Influenza A: NEGATIVE
Influenza B: NEGATIVE

## 2017-07-15 LAB — RAPID STREP SCREEN (MED CTR MEBANE ONLY): Strep Gp A Ag, IA W/Reflex: POSITIVE — AB

## 2017-07-15 MED ORDER — AMOXICILLIN 500 MG PO CAPS: 500.0000 mg | ORAL_CAPSULE | Freq: Two times a day (BID) | ORAL | 0 refills | Status: AC

## 2017-07-15 NOTE — Progress Notes (Signed)
  Subjective:   Patient ID: Rebecca Gardner, female    DOB: 1995/12/20, 22 y.o.   MRN: [id number] CC: Sore Throat; Headache; Generalized Body Aches; and Emesis  HPI: Rebecca Gardner is a 22 y.o. female presenting for Sore Throat; Headache; Generalized Body Aches; and Emesis  Has been sick for about 9 days Temp up to 99.8 last week Sore throat bothering her the most Appetite has been down  Relevant past medical, surgical, family and social history reviewed. Allergies and medications reviewed and updated. Social History   Tobacco Use  Smoking Status Never Smoker  Smokeless Tobacco Never Used   ROS: Per HPI   Objective:    BP 138/88   Pulse (!) 105   Temp 98.2 F (36.8 C) (Oral)   Ht 5\' 8"  (1.727 m)   Wt (!) 343 lb 12.8 oz (155.9 kg)   BMI 52.27 kg/m   Wt Readings from Last 3 Encounters:  07/15/17 (!) 343 lb 12.8 oz (155.9 kg)  06/10/17 (!) 341 lb 6.4 oz (154.9 kg)  02/05/17 (!) 335 lb 9.6 oz (152.2 kg)    Gen: NAD, alert, cooperative with exam, NCAT EYES: EOMI, no conjunctival injection, or no icterus ENT:  TMs dull gray b/l, OP with erythema LYMPH: no cervical LAD CV: NRRR, normal S1/S2, no murmur, distal pulses 2+ b/l Resp: CTABL, no wheezes, normal WOB Neuro: Alert and oriented, strength equal b/l UE and LE, coordination grossly normal MSK: normal muscle bulk  Assessment & Plan:  Rebecca Gardner was seen today for sore throat, headache, generalized body aches and emesis.  Diagnoses and all orders for this visit:  Sore throat Flu neg, strep positive -     Veritor Flu A/B Waived -     Rapid St[facility] Screen ([facility]) -     Upper Respiratory Culture, Routine  Body aches -     Veritor Flu A/B Waived -     [facility]apid St[facility] Screen ([facility]) -     Upper Respiratory Culture, Routine  Strep pharyngitis Discussed symptom care. Start below -     amoxicillin (AMOXIL) 500 MG capsule; Take 1 capsule (500 mg total) by mouth 2 (two) times daily for 10 days.   Follow up  plan: Return if symptoms worsen or fail to improve. Rex Krasarol Paco Cislo, MD [facility]

## 2017-08-27 ENCOUNTER — Encounter: Payer: Self-pay | Admitting: Family

## 2017-08-27 ENCOUNTER — Ambulatory Visit: Payer: BLUE CROSS/BLUE SHIELD | Admitting: Family

## 2017-08-27 VITALS — BP 134/90 | HR 132 | Temp 98.6°F | Ht 68.0 in | Wt 332.6 lb

## 2017-08-27 DIAGNOSIS — A084 Viral intestinal infection, unspecified: Secondary | ICD-10-CM

## 2017-08-27 MED ORDER — ONDANSETRON 4 MG PO TBDP: 4.0000 mg | ORAL_TABLET | Freq: Three times a day (TID) | ORAL | 0 refills | Status: DC | PRN

## 2017-08-27 NOTE — Patient Instructions (Signed)

## 2017-08-27 NOTE — Progress Notes (Signed)
   Subjective:    Patient ID: Trey SailorsKayla A Boule, female    DOB: 1995/09/23, 22 y.o.   MRN: [id number]  Emesis   This is a new problem. The current episode started today. The problem occurs more than 10 times per day. The problem has been unchanged. The emesis has an appearance of stomach contents. There has been no fever. Associated symptoms include chills, diarrhea and myalgias. Pertinent negatives include no arthralgias, chest pain, fever, URI or weight loss. Risk factors include ill contacts. She has tried bed rest and sleep for the symptoms. The treatment provided mild relief.      Review of Systems  Constitutional: Positive for chills. Negative for fever and weight loss.  Cardiovascular: Negative for chest pain.  Gastrointestinal: Positive for diarrhea and vomiting.  Musculoskeletal: Positive for myalgias. Negative for arthralgias.  All other systems reviewed and are negative.      Objective:   Physical Exam  Constitutional: She is oriented to person, place, and time. She appears well-developed and well-nourished. She appears ill. No distress.  Morbid obese   HENT:  Head: Normocephalic and atraumatic.  Cardiovascular: Normal rate, regular rhythm, normal heart sounds and intact distal pulses.  No murmur heard. Pulmonary/Chest: Effort normal and breath sounds normal. No respiratory distress. She has no wheezes.  Abdominal: Soft. Bowel sounds are normal. She exhibits no distension. There is no tenderness.  Musculoskeletal: Normal range of motion. She exhibits no edema or tenderness.  Neurological: She is alert and oriented to person, place, and time. Coordination normal.  Skin: Skin is warm and dry.  Psychiatric: She has a normal mood and affect. Her behavior is normal. Judgment and thought content normal.  Vitals reviewed.     BP 134/90   Pulse (!) 132   Temp 98.6 F (37 C) (Oral)   Ht 5\' 8"  (1.727 m)   Wt (!) 332 lb 9.6 oz (150.9 kg)   BMI 50.57 kg/m      Assessment  & Plan:  1. Viral gastroenteritis Rest Force fluids Bland diet RTO prn  - ondansetron (ZOFRAN ODT) 4 MG disintegrating tablet; Take 1 tablet (4 mg total) by mouth every 8 (eight) hours as needed for nausea or vomiting.  Dispense: 20 tablet; Refill: 0    Jannifer Rodneyhristy Recardo Linn, FNP

## 2017-10-28 ENCOUNTER — Ambulatory Visit: Payer: BLUE CROSS/BLUE SHIELD | Admitting: Obstetrics & Gynecology

## 2017-11-15 ENCOUNTER — Ambulatory Visit: Payer: BLUE CROSS/BLUE SHIELD | Admitting: Pediatrics

## 2017-11-15 ENCOUNTER — Encounter: Payer: Self-pay | Admitting: Pediatrics

## 2017-11-15 VITALS — BP 137/81 | HR 117 | Temp 98.8°F | Ht 68.0 in | Wt 341.2 lb

## 2017-11-15 DIAGNOSIS — F339 Major depressive disorder, recurrent, unspecified: Secondary | ICD-10-CM

## 2017-11-15 DIAGNOSIS — R51 Headache: Secondary | ICD-10-CM | POA: Diagnosis not present

## 2017-11-15 DIAGNOSIS — R519 Headache, unspecified: Secondary | ICD-10-CM

## 2017-11-15 MED ORDER — SERTRALINE HCL 50 MG PO TABS: ORAL_TABLET | ORAL | 0 refills | Status: DC
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
## 2017-11-16 ENCOUNTER — Telehealth: Payer: Self-pay

## 2017-11-16 IMAGING — MR MR ORBITS WO/W CM
17 of 21 series · 30 of 48 positions shown · IV contrast (cc MH)
Comparison: CT head 09/25/2013.  MR brain reported separately.

CLINICAL DATA: Papilledema. Remote history of Concussion. Morbid
obesity with a weight of 327 pounds.

EXAM:
MRI OF THE ORBITS WITHOUT AND WITH CONTRAST
TECHNIQUE: Multiplanar, multisequence MR imaging of the orbits was performed
both before and after the administration of intravenous contrast.
CONTRAST:  MultiHance 20 mL.

[Series 2: FLAIR · sagittal · 5.0mm · 0.47mm/px · 1 of 23 slices shown (1 of 2)]
[im 1/23]
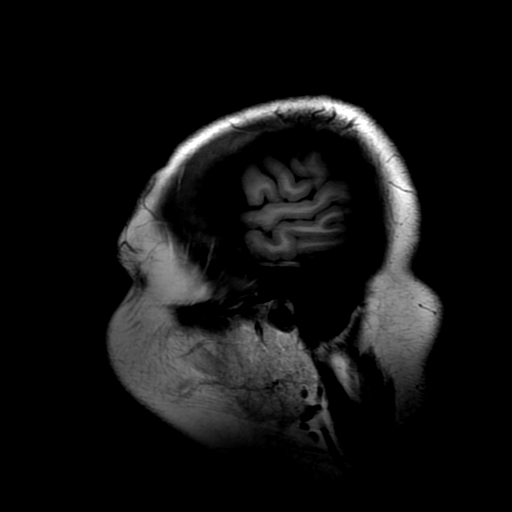

[Series 5: DWI · axial · 3.0mm · 0.94mm/px · z∈[-64,+79]mm · 5 of 100 slices shown (1 of 2)]
[im 1/100]
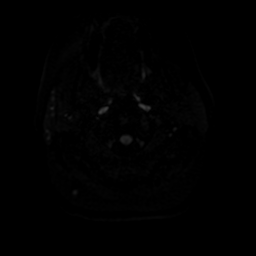
[im 25/100]
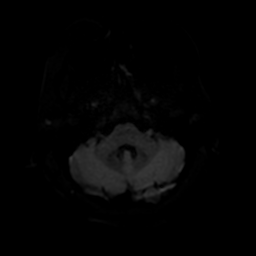
[im 50/100]
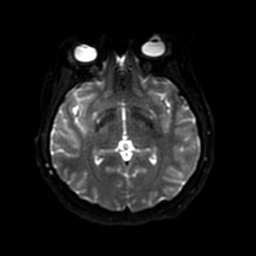
[im 75/100]
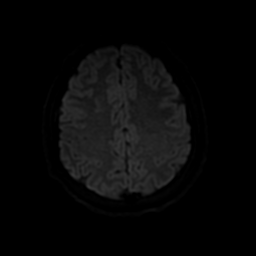
[im 100/100]
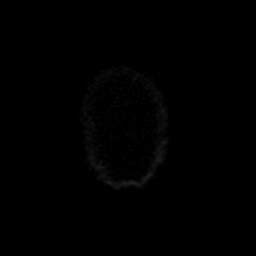

[Series 6: T2 · axial · 5.0mm · 0.47mm/px · 1 of 27 slices shown]
[im 1/27]
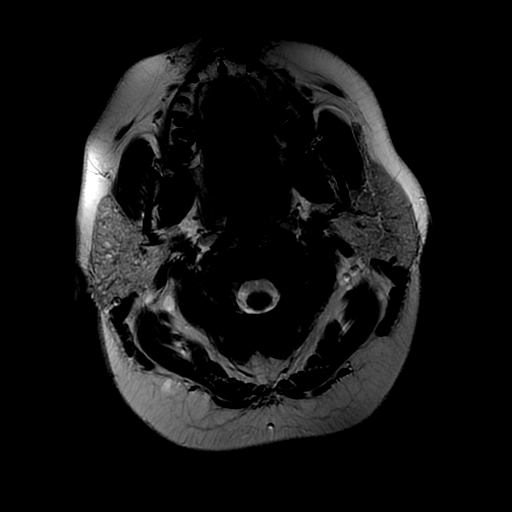

[Series 7: FLAIR · axial · 5.0mm · 0.47mm/px · z∈[-50,+106]mm · 2 of 27 slices shown (2 of 2)]
[im 1/27]
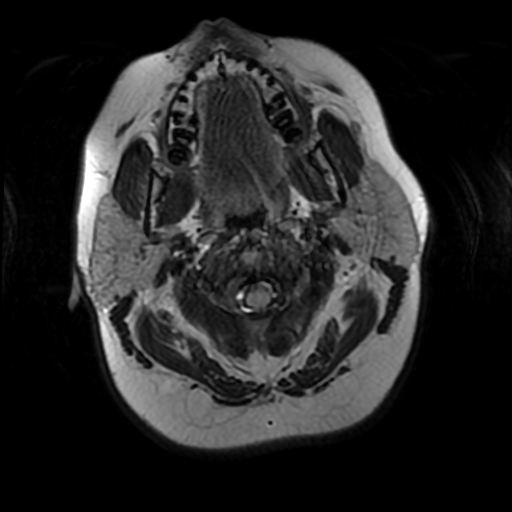
[im 27/27]
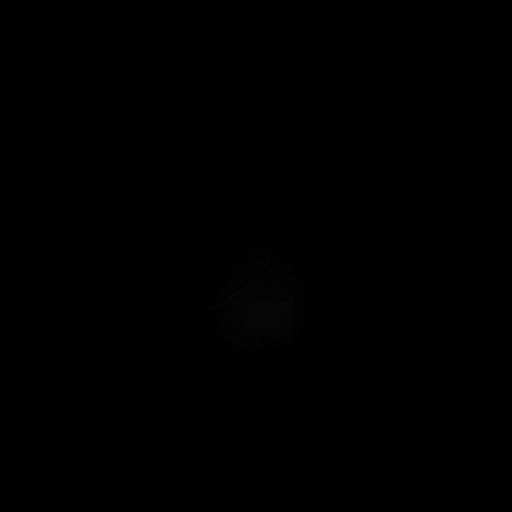

[Series 8: DWI · coronal · 4.0mm · 0.94mm/px · 4 of 68 slices shown (2 of 2)]
[im 1/68]
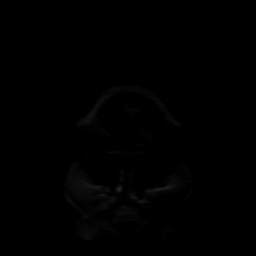
[im 23/68]
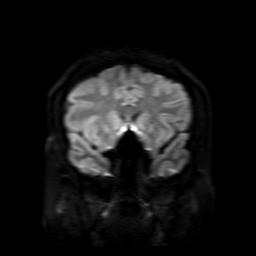
[im 45/68]
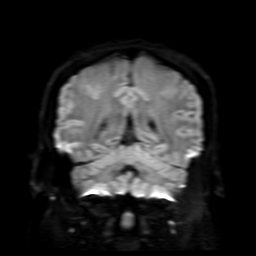
[im 68/68]
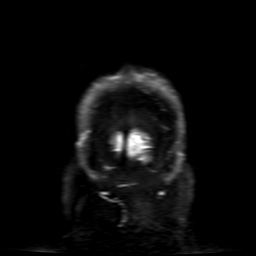

[Series 11: T2 post-contrast · coronal · 5.0mm · 0.39mm/px · 2 of 28 slices shown]
[im 1/28]
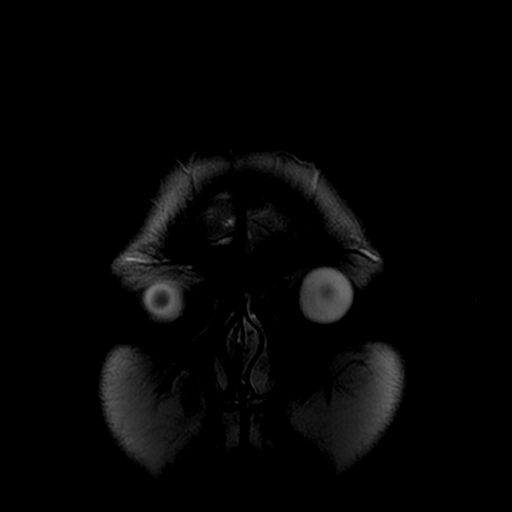
[im 28/28]
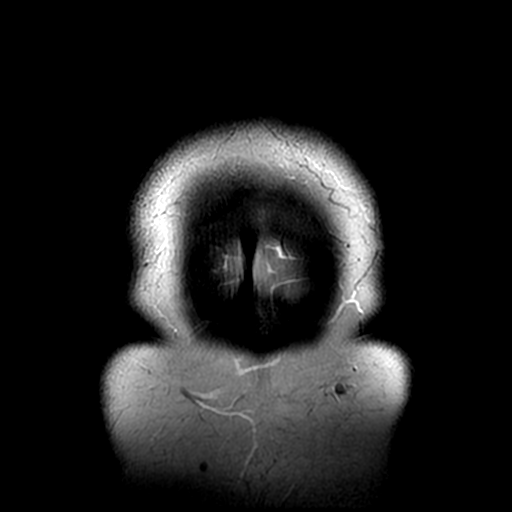

[Series 13: T1 · axial · 3.0mm · 0.35mm/px · 1 of 20 slices shown (1 of 6)]
[im 1/20]
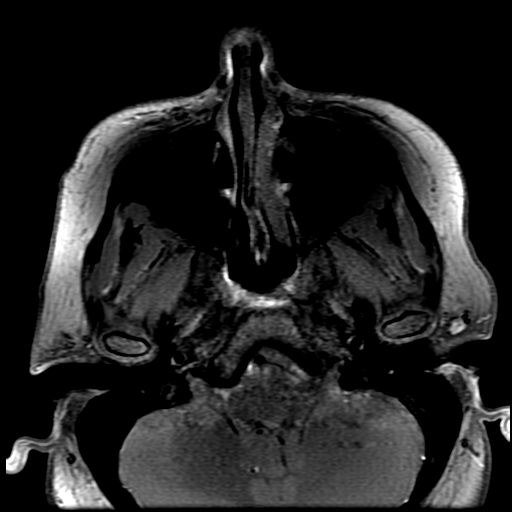

[Series 14: T1 · coronal · 4.0mm · 0.35mm/px · 1 of 22 slices shown (2 of 6)]
[im 1/22]
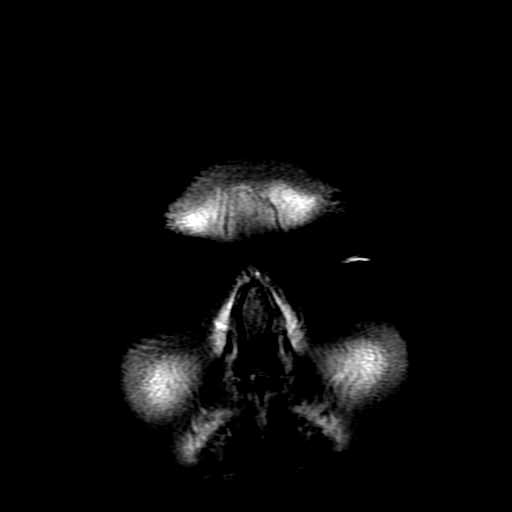

[Series 15: T2 fat-sat · coronal · 4.0mm · 0.35mm/px · 1 of 22 slices shown (1 of 2)]
[im 1/22]
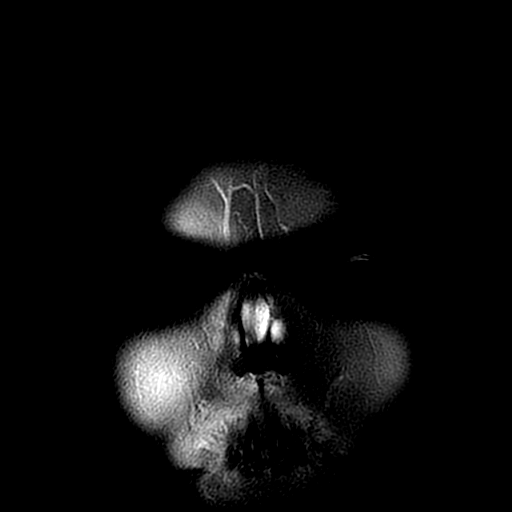

[Series 16: T2 fat-sat · axial · 3.0mm · 0.35mm/px · 1 of 20 slices shown (2 of 2)]
[im 1/20]
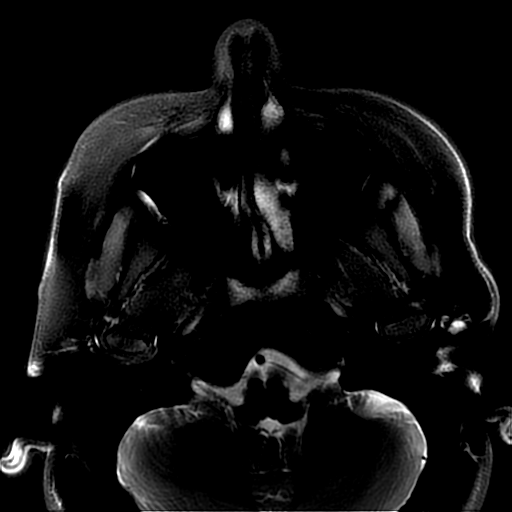

[Series 18: T1 · coronal · 4.0mm · 0.35mm/px · 1 of 22 slices shown (3 of 6)]
[im 1/22]
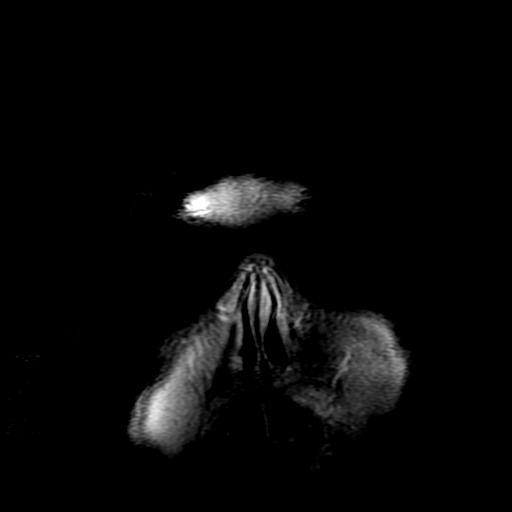

[Series 19: T1 · axial · 3.0mm · 0.35mm/px · 1 of 20 slices shown (4 of 6)]
[im 1/20]
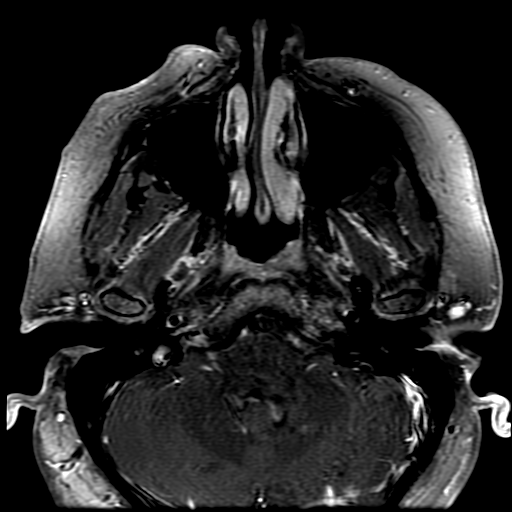

[Series 20: T1 fat-sat · coronal · 4.0mm · 0.70mm/px · 1 of 22 slices shown]
[im 1/22]
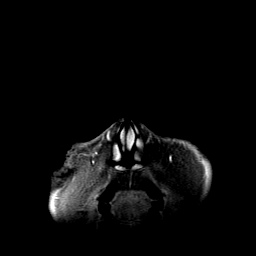

[Series 21: T1 · axial · 3.0mm · 0.70mm/px · 1 of 20 slices shown (5 of 6)]
[im 1/20]
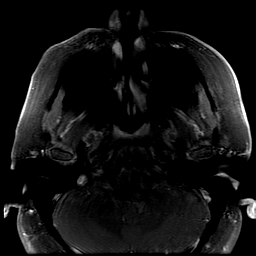

[Series 23: T1 · coronal · 5.0mm · 0.39mm/px · 2 of 28 slices shown (6 of 6)]
[im 1/28]
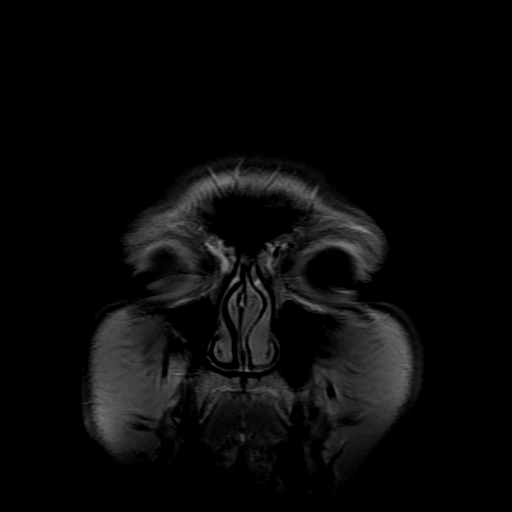
[im 28/28]
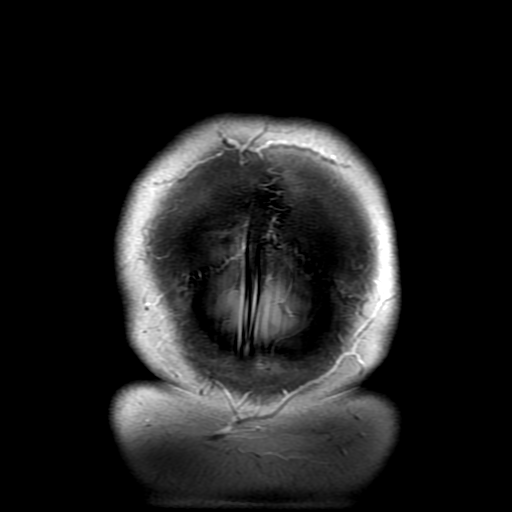

[Series 550: ADC · axial · 3.0mm · 0.94mm/px · z∈[-64,+79]mm · 3 of 50 slices shown (1 of 2)]
[im 1/50]
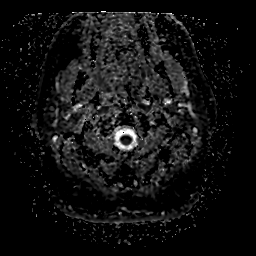
[im 25/50]
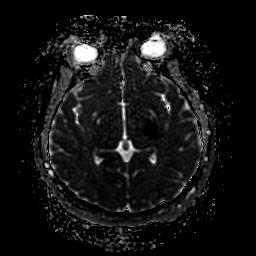
[im 50/50]
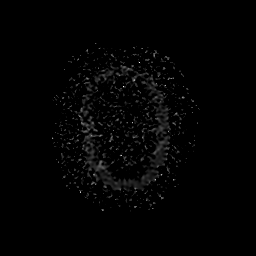

[Series 850: ADC · coronal · 4.0mm · 0.94mm/px · 2 of 34 slices shown (2 of 2)]
[im 1/34]
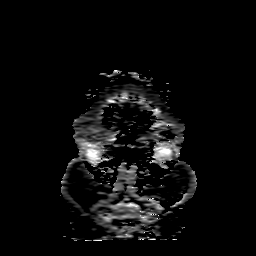
[im 34/34]
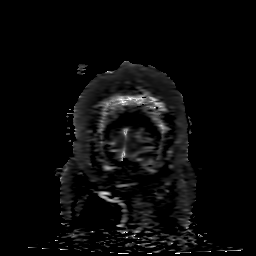

[30 of 48 positions shown; findings below may reference images not displayed]

FINDINGS: Dedicated imaging of the orbits was performed utilizing fat
saturation techniques. Symmetric globes. No orbital masses. No
abnormal postcontrast enhancement.

Mild prominence of the perioptic nerve sheaths. No definite
flattening of the posterior globe at the insertion of the optic
nerves, but early papilledema not excluded based on these imaging
findings.

No paranasal sinus disease of significance. No cavernous sinus
lesions evident.
IMPRESSION: Mild prominence of the perioptic nerve sheaths consistent with
suspected papilledema. No orbital mass is seen.

## 2017-11-16 NOTE — Telephone Encounter (Signed)
V[facility] - left message on 11-15-2017 and 11-16-2017

## 2017-11-22 ENCOUNTER — Ambulatory Visit: Payer: BLUE CROSS/BLUE SHIELD | Admitting: Pediatrics

## 2017-11-29 ENCOUNTER — Ambulatory Visit: Payer: BLUE CROSS/BLUE SHIELD | Admitting: Pediatrics

## 2017-12-01 ENCOUNTER — Telehealth: Payer: Self-pay

## 2017-12-01 NOTE — Telephone Encounter (Signed)
V[facility] - left msg

## 2017-12-02 ENCOUNTER — Encounter: Payer: Self-pay | Admitting: Pediatrics

## 2017-12-07 ENCOUNTER — Telehealth: Payer: Self-pay

## 2017-12-07 NOTE — Telephone Encounter (Signed)
V[facility] - Patietn was on her way to work and unable to talk,  Patient reports that I can call her back tomorrow at 11:50am to complete the intake assessment.

## 2017-12-13 ENCOUNTER — Ambulatory Visit: Payer: BLUE CROSS/BLUE SHIELD | Admitting: Pediatrics

## 2017-12-13 ENCOUNTER — Telehealth: Payer: Self-pay

## 2017-12-13 ENCOUNTER — Encounter: Payer: Self-pay | Admitting: Pediatrics

## 2017-12-13 VITALS — BP 136/89 | HR 76 | Temp 98.5°F | Ht 68.0 in | Wt 338.0 lb

## 2017-12-13 DIAGNOSIS — L409 Psoriasis, unspecified: Secondary | ICD-10-CM | POA: Diagnosis not present

## 2017-12-13 DIAGNOSIS — F339 Major depressive disorder, recurrent, unspecified: Secondary | ICD-10-CM | POA: Diagnosis not present

## 2017-12-13 MED ORDER — SERTRALINE HCL 100 MG PO TABS: ORAL_TABLET | ORAL | 3 refills | Status: DC

## 2017-12-13 MED ORDER — BETAMETHASONE DIPROPIONATE 0.05 % EX CREA: TOPICAL_CREAM | Freq: Two times a day (BID) | CUTANEOUS | 2 refills | Status: DC

## 2017-12-13 NOTE — Progress Notes (Signed)
  Subjective:   Patient ID: Rebecca Gardner, female    DOB: June 02, 1996, 22 y.o.   MRN: [id number] CC: Depression (2 week)  HPI: Rebecca Gardner is a 22 y.o. female   Depression: Ongoing symptoms.  She says about the same since last visit.  She says she was called once by virtual behavioral health, has not yet been able to connect with them for intake.  No plans for self-harm.  No side effects from the sertraline.  She is not sure if it is made much of a difference yet.  She is been taking 50 mg for the last 2-1/2 weeks.  Headaches: Head was then teeth extracted 3 days ago, has had some headache off and on since then.  Psoriasis: Has had flares off and on since middle school.  Primarily at the corners of her nose, and her eyebrows, at her knees.  Asks for refill of her steroid cream.  Relevant past medical, surgical, family and social history reviewed. Allergies and medications reviewed and updated. Social History   Tobacco Use  Smoking Status Never Smoker  Smokeless Tobacco Never Used   ROS: Per HPI   Objective:    BP 136/89   Pulse 76   Temp 98.5 F (36.9 C) (Oral)   Ht 5\' 8"  (1.727 m)   Wt (!) 338 lb (153.3 kg)   BMI 51.39 kg/m   Wt Readings from Last 3 Encounters:  12/13/17 (!) 338 lb (153.3 kg)  11/15/17 (!) 341 lb 3.2 oz (154.8 kg)  08/27/17 (!) 332 lb 9.6 oz (150.9 kg)    Gen: NAD, alert, cooperative with exam, NCAT EYES: EOMI, no conjunctival injection, or no icterus ENT: OP without erythema LYMPH: no cervical LAD CV: NRRR, normal S1/S2, no murmur, distal pulses 2+ b/l Resp: CTABL, no wheezes, normal WOB Abd: +BS, soft, NTND.  Ext: No edema, warm Neuro: Alert and oriented MSK: normal muscle bulk Psych: No thoughts of self-harm.  Assessment & Plan:  Rebecca Gardner was seen today for depression.  Diagnoses and all orders for this visit:  Depression, recurrent (HCC) Ongoing symptoms.  Strongly encouraged counseling.  Gave list of counselors.  Patient may be interested  in [facility].  Sent in virtual behavioral health referral. -     sertraline (ZOLOFT) 100 MG tablet; Take full tab once daily  Psoriasis Start with over-the-counter hydrocortisone.  If not improving can use below.  Minimize use on face, and sun exposed areas.  If not improving within a week let me know. -     betamethasone dipropionate (DIPROLENE) 0.05 % cream; Apply topically 2 (two) times daily.   Follow up plan: Return in about 1 month (around 01/10/2018). Rex Krasarol Gunnard Dorrance, MD [facility]

## 2017-12-13 NOTE — Patient Instructions (Signed)
Your provider wants you to schedule an appointment with a Psychologist/Psychiatrist. The following list of offices requires the patient to call and make their own appointment, as there is information they need that only you can provide. Please feel free to choose form the following providers:  [place] [facility]   [phone] Crisis Recovery in [place] [phone]  [facility]  [phone]   [address] [place],  [place]  (Scheduled through [facility]) Must call and do an interview for appointment. Sees Children / Accepts Medicaid  [facility]    [phone]  [address], [address]    [place], [place]       [facility]  [phone] [address] [place], [place]  Evaluates for Autism but does not treat it Sees Children / Accepts Medicaid  [facility]    [phone] [address], [address]   [place], [place] Medication management, substance abuse, bipolar, grief, family, marriage, OCD, anxiety, PTSD Sees children / Accepts Medicaid  [facility]    [phone] [address] [place], [place] Sees children / Accepts Medicaid  [facility]  [phone] [address] [place], [place]   Dr Akinlayo     [phone] [address] [place], [place]  Sees ADD & ADHD for treatment Accepts Medicaid  [facility]  [phone] [address] [place], [place] Evaluates for Autism Accepts Medicaid   [facility]   [phone] [address]   [place], [place] Uses animal therapy  Sees children as young as 3 years old Accepts Medicaid  [facility]     [phone]    [address]  ,  [address] Sees children Accepts Medicaid   
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
## 2017-12-24 ENCOUNTER — Telehealth: Payer: Self-pay | Admitting: Obstetrics & Gynecology

## 2017-12-24 NOTE — Telephone Encounter (Signed)
Left message regarding upcoming aex appointment 12/31/17 with Dr.Miller has been canceled and needs to be rescheduled. °

## 2017-12-31 ENCOUNTER — Ambulatory Visit: Payer: BLUE CROSS/BLUE SHIELD | Admitting: Obstetrics & Gynecology

## 2018-01-05 ENCOUNTER — Telehealth: Payer: Self-pay

## 2018-01-05 NOTE — Telephone Encounter (Signed)
[facility] - Left Msg 

## 2018-01-11 ENCOUNTER — Telehealth: Payer: Self-pay

## 2018-01-11 DIAGNOSIS — F419 Anxiety disorder, unspecified: Secondary | ICD-10-CM

## 2018-01-11 DIAGNOSIS — F339 Major depressive disorder, recurrent, unspecified: Secondary | ICD-10-CM
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
## 2018-01-12 NOTE — Progress Notes (Signed)
Worsening anxiety, while there has been improvement in depression. No change in medication recommendation. BH specialist to coach self compassion, healthy boundary. Will also monitor for any PTSD symptoms.

## 2018-01-17 ENCOUNTER — Telehealth: Payer: Self-pay | Admitting: *Deleted

## 2018-01-17 NOTE — Telephone Encounter (Signed)
Left message for patient to call back to schedule an appt for follow up on depression/anxiety

## 2018-01-18 ENCOUNTER — Telehealth: Payer: Self-pay

## 2018-01-18 NOTE — Telephone Encounter (Signed)
2nd attempt - Writer was not able to leave a voice mail message.

## 2018-02-15 ENCOUNTER — Telehealth: Payer: Self-pay

## 2018-02-15 NOTE — Telephone Encounter (Signed)
3rd attempt - [facility]  

## 2018-02-21 ENCOUNTER — Telehealth: Payer: Self-pay

## 2018-02-21 NOTE — Telephone Encounter (Signed)
Several attempts have been made to contact patient without success. Patient will be placed on the inactive list.  If services are needed again.  Please contact [facility] at [phone].    Information will be routed to the PCP and Dr. Hisada  

## 2018-04-25 ENCOUNTER — Encounter: Payer: Self-pay | Admitting: Nurse Practitioner

## 2018-04-25 ENCOUNTER — Ambulatory Visit (INDEPENDENT_AMBULATORY_CARE_PROVIDER_SITE_OTHER): Payer: BLUE CROSS/BLUE SHIELD | Admitting: Nurse Practitioner

## 2018-04-25 ENCOUNTER — Ambulatory Visit (INDEPENDENT_AMBULATORY_CARE_PROVIDER_SITE_OTHER): Payer: BLUE CROSS/BLUE SHIELD

## 2018-04-25 VITALS — BP 134/89 | HR 105 | Temp 98.4°F | Ht 68.0 in | Wt 353.0 lb

## 2018-04-25 DIAGNOSIS — R1084 Generalized abdominal pain: Secondary | ICD-10-CM

## 2018-04-25 DIAGNOSIS — K5901 Slow transit constipation: Secondary | ICD-10-CM | POA: Diagnosis not present

## 2018-04-25 DIAGNOSIS — L409 Psoriasis, unspecified: Secondary | ICD-10-CM

## 2018-04-25 MED ORDER — BETAMETHASONE DIPROPIONATE 0.05 % EX CREA: TOPICAL_CREAM | Freq: Two times a day (BID) | CUTANEOUS | 2 refills | Status: DC

## 2018-04-25 NOTE — Addendum Note (Signed)
Addended by: Bennie PieriniMARTIN, MARY-MARGARET on: 04/25/2018 05:10 PM   Modules accepted: Orders

## 2018-04-25 NOTE — Progress Notes (Signed)
   Subjective:    Patient ID: Rebecca Gardner, female    DOB: May 14, 1996, 22 y.o.   MRN: [id number]   Chief Complaint: Difficulty with BMs (Thinks she may have a blockage)   HPI Patient come sin today accompanied by her mom. She says that she is having trouble with bowel movements. She says she will not poop for several days then she will have watery stools 4-5 x a day. 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   Review of Systems  Constitutional: Negative.   Respiratory: Negative.   Cardiovascular: Negative.   Gastrointestinal: Positive for abdominal pain and constipation. Negative for diarrhea, nausea and vomiting.  Genitourinary: Negative.   Neurological: Negative.   Psychiatric/Behavioral: Negative.   All other systems reviewed and are negative.      Objective:   Physical Exam  Constitutional: She is oriented to person, place, and time. She appears well-developed and well-nourished. No distress.  Cardiovascular: Normal rate and regular rhythm.  Pulmonary/Chest: Effort normal.  Abdominal: Soft. There is tenderness.  Neurological: She is alert and oriented to person, place, and time.  Skin: Skin is warm.  Psychiatric: She has a normal mood and affect. Her behavior is normal. Thought content normal.   BP 134/89   Pulse (!) 105   Temp 98.4 F (36.9 C) (Oral)   Ht 5\' 8"  (1.727 m)   Wt (!) 353 lb (160.1 kg)   BMI 53.67 kg/m   KUB- Moderate stool in ascending colon       Assessment & Plan:  Rebecca Gardner in today with chief complaint of Difficulty with BMs (Thinks she may have a blockage)   1. Generalized abdominal pain - DG Abd 1 View; Future  2. Slow transit constipation miralax daily with apple juice Increase fiber in diet Force fluids RTO prn  Mary-Margaret Daphine DeutscherMartin, FNP

## 2018-04-25 NOTE — Patient Instructions (Signed)

## 2018-05-16 ENCOUNTER — Encounter

## 2018-05-16 ENCOUNTER — Ambulatory Visit: Payer: BLUE CROSS/BLUE SHIELD | Admitting: Obstetrics & Gynecology

## 2018-06-30 ENCOUNTER — Encounter: Payer: Self-pay | Admitting: Diagnostic Neuroimaging

## 2018-07-07 ENCOUNTER — Encounter: Payer: Self-pay | Admitting: Family

## 2018-07-07 ENCOUNTER — Ambulatory Visit (INDEPENDENT_AMBULATORY_CARE_PROVIDER_SITE_OTHER): Payer: BLUE CROSS/BLUE SHIELD | Admitting: Family

## 2018-07-07 VITALS — BP 140/84 | HR 106 | Temp 97.9°F | Ht 68.0 in | Wt 347.0 lb

## 2018-07-07 DIAGNOSIS — J019 Acute sinusitis, unspecified: Secondary | ICD-10-CM

## 2018-07-07 DIAGNOSIS — J029 Acute pharyngitis, unspecified: Secondary | ICD-10-CM | POA: Diagnosis not present

## 2018-07-07 LAB — RAPID STREP SCREEN (MED CTR MEBANE ONLY): Strep Gp A Ag, IA W/Reflex: NEGATIVE

## 2018-07-07 LAB — CULTURE, GROUP A STREP

## 2018-07-07 MED ORDER — AMOXICILLIN-POT CLAVULANATE 875-125 MG PO TABS: 1.0000 | ORAL_TABLET | Freq: Two times a day (BID) | ORAL | 0 refills | Status: DC

## 2018-07-07 NOTE — Patient Instructions (Signed)
Sinusitis, Adult  Sinusitis is inflammation of your sinuses. Sinuses are hollow spaces in the bones around your face. Your sinuses are located:   Around your eyes.   In the middle of your forehead.   Behind your nose.   In your cheekbones.  Mucus normally drains out of your sinuses. When your nasal tissues become inflamed or swollen, mucus can become trapped or blocked. This allows bacteria, viruses, and fungi to grow, which leads to infection. Most infections of the sinuses are caused by a virus.  Sinusitis can develop quickly. It can last for up to 4 weeks (acute) or for more than 12 weeks (chronic). Sinusitis often develops after a cold.  What are the causes?  This condition is caused by anything that creates swelling in the sinuses or stops mucus from draining. This includes:   Allergies.   Asthma.   Infection from bacteria or viruses.   Deformities or blockages in your nose or sinuses.   Abnormal growths in the nose (nasal polyps).   Pollutants, such as chemicals or irritants in the air.   Infection from fungi (rare).  What increases the risk?  You are more likely to develop this condition if you:   Have a weak body defense system (immune system).   Do a lot of swimming or diving.   Overuse nasal sprays.   Smoke.  What are the signs or symptoms?  The main symptoms of this condition are pain and a feeling of pressure around the affected sinuses. Other symptoms include:   Stuffy nose or congestion.   Thick drainage from your nose.   Swelling and warmth over the affected sinuses.   Headache.   Upper toothache.   A cough that may get worse at night.   Extra mucus that collects in the throat or the back of the nose (postnasal drip).   Decreased sense of smell and taste.   Fatigue.   A fever.   Sore throat.   Bad breath.  How is this diagnosed?  This condition is diagnosed based on:   Your symptoms.   Your medical history.   A physical exam.   Tests to find out if your condition is  acute or chronic. This may include:  ? Checking your nose for nasal polyps.  ? Viewing your sinuses using a device that has a light (endoscope).  ? Testing for allergies or bacteria.  ? Imaging tests, such as an MRI or CT scan.  In rare cases, a bone biopsy may be done to rule out more serious types of fungal sinus disease.  How is this treated?  Treatment for sinusitis depends on the cause and whether your condition is chronic or acute.   If caused by a virus, your symptoms should go away on their own within 10 days. You may be given medicines to relieve symptoms. They include:  ? Medicines that shrink swollen nasal passages (topical intranasal decongestants).  ? Medicines that treat allergies (antihistamines).  ? A spray that eases inflammation of the nostrils (topical intranasal corticosteroids).  ? Rinses that help get rid of thick mucus in your nose (nasal saline washes).   If caused by bacteria, your health care provider may recommend waiting to see if your symptoms improve. Most bacterial infections will get better without antibiotic medicine. You may be given antibiotics if you have:  ? A severe infection.  ? A weak immune system.   If caused by narrow nasal passages or nasal polyps, you may need   to have surgery.  Follow these instructions at home:  Medicines   Take, use, or apply over-the-counter and prescription medicines only as told by your health care provider. These may include nasal sprays.   If you were prescribed an antibiotic medicine, take it as told by your health care provider. Do not stop taking the antibiotic even if you start to feel better.  Hydrate and humidify     Drink enough fluid to keep your urine pale yellow. Staying hydrated will help to thin your mucus.   Use a cool mist humidifier to keep the humidity level in your home above 50%.   Inhale steam for 10-15 minutes, 3-4 times a day, or as told by your health care provider. You can do this in the bathroom while a hot shower is  running.   Limit your exposure to cool or dry air.  Rest   Rest as much as possible.   Sleep with your head raised (elevated).   Make sure you get enough sleep each night.  General instructions     Apply a warm, moist washcloth to your face 3-4 times a day or as told by your health care provider. This will help with discomfort.   Wash your hands often with soap and water to reduce your exposure to germs. If soap and water are not available, use hand sanitizer.   Do not smoke. Avoid being around people who are smoking (secondhand smoke).   Keep all follow-up visits as told by your health care provider. This is important.  Contact a health care provider if:   You have a fever.   Your symptoms get worse.   Your symptoms do not improve within 10 days.  Get help right away if:   You have a severe headache.   You have persistent vomiting.   You have severe pain or swelling around your face or eyes.   You have vision problems.   You develop confusion.   Your neck is stiff.   You have trouble breathing.  Summary   Sinusitis is soreness and inflammation of your sinuses. Sinuses are hollow spaces in the bones around your face.   This condition is caused by nasal tissues that become inflamed or swollen. The swelling traps or blocks the flow of mucus. This allows bacteria, viruses, and fungi to grow, which leads to infection.   If you were prescribed an antibiotic medicine, take it as told by your health care provider. Do not stop taking the antibiotic even if you start to feel better.   Keep all follow-up visits as told by your health care provider. This is important.  This information is not intended to replace advice given to you by your health care provider. Make sure you discuss any questions you have with your health care provider.  Document Released: 05/25/2005 Document Revised: 10/25/2017 Document Reviewed: 10/25/2017  Elsevier Interactive Patient Education  2019 [employer]

## 2018-07-07 NOTE — Progress Notes (Signed)
Subjective:    Patient ID: Rebecca Gardner, female    DOB: Jan 13, 1996, 23 y.o.   MRN: [id number]  Chief Complaint  Patient presents with  . Sinus Problem  . Sore Throat  . Cough    Sore Throat   This is a new problem. The current episode started 1 to 4 weeks ago (three weeks ago). The problem has been unchanged. There has been no fever. The pain is at a severity of 8/10. The pain is moderate. Associated symptoms include congestion, coughing, headaches and trouble swallowing. Pertinent negatives include no ear pain or swollen glands. She has had exposure to strep. She has tried acetaminophen for the symptoms. The treatment provided mild relief.      Review of Systems  HENT: Positive for congestion and trouble swallowing. Negative for ear pain.   Respiratory: Positive for cough.   Neurological: Positive for headaches.  All other systems reviewed and are negative.      Objective:   Physical Exam Vitals signs reviewed.  Constitutional:      General: She is not in acute distress.    Appearance: She is well-developed.  HENT:     Head: Normocephalic and atraumatic.     Right Ear: External ear normal. Tympanic membrane is erythematous.     Nose:     Right Sinus: Maxillary sinus tenderness present.     Left Sinus: Maxillary sinus tenderness present.     Mouth/Throat:     Pharynx: Posterior oropharyngeal erythema present.  Eyes:     Pupils: Pupils are equal, round, and reactive to light.  Neck:     Musculoskeletal: Normal range of motion and neck supple.     Thyroid: No thyromegaly.  Cardiovascular:     Rate and Rhythm: Normal rate and regular rhythm.     Heart sounds: Normal heart sounds. No murmur.  Pulmonary:     Effort: Pulmonary effort is normal. No respiratory distress.     Breath sounds: Normal breath sounds. No wheezing.     Comments: Intermittent nonproductive cough Abdominal:     General: Bowel sounds are normal. There is no distension.     Palpations: Abdomen  is soft.     Tenderness: There is no abdominal tenderness.  Musculoskeletal: Normal range of motion.        General: No tenderness.  Skin:    General: Skin is warm and dry.  Neurological:     Mental Status: She is alert and oriented to person, place, and time.     Cranial Nerves: No cranial nerve deficit.     Deep Tendon Reflexes: Reflexes are normal and symmetric.  Psychiatric:        Behavior: Behavior normal.        Thought Content: Thought content normal.        Judgment: Judgment normal.       BP (!) 166/113   Pulse 100   Temp 97.9 F (36.6 C) (Oral)   Ht 5\' 8"  (1.727 m)   Wt (!) 347 lb (157.4 kg)   BMI 52.76 kg/m      Assessment & Plan:  Rebecca Gardner comes in today with chief complaint of Sinus Problem; Sore Throat; and Cough   Diagnosis and orders addressed:  1. Sore throat - Rapid Strep Screen ([facility] ONLY)  2. Acute sinusitis, recurrence not specified, unspecified location - Take meds as prescribed - Use a cool mist humidifier  -Use saline nose sprays frequently -Force fluids -For any  cough or congestion  Use plain Mucinex- regular strength or max strength is fine -For fever or aces or pains- take tylenol or ibuprofen. -Throat lozenges if help -New toothbrush in 3 days RTO if symptoms worsen or do not improve  - amoxicillin-clavulanate (AUGMENTIN) 875-125 MG tablet; Take 1 tablet by mouth 2 (two) times daily.  Dispense: 14 tablet; Refill: 0   Jannifer Rodney, FNP

## 2018-07-15 ENCOUNTER — Ambulatory Visit: Payer: BLUE CROSS/BLUE SHIELD | Admitting: Diagnostic Neuroimaging

## 2018-07-29 ENCOUNTER — Encounter (HOSPITAL_COMMUNITY): Payer: Self-pay | Admitting: *Deleted

## 2018-07-29 ENCOUNTER — Other Ambulatory Visit: Payer: Self-pay

## 2018-07-29 ENCOUNTER — Emergency Department (HOSPITAL_COMMUNITY)
Admission: EM | Admit: 2018-07-29 | Discharge: 2018-07-29 | Disposition: A | Discharge status: Home / Self Care | Payer: BLUE CROSS/BLUE SHIELD | Attending: Emergency Medicine | Admitting: Emergency Medicine

## 2018-07-29 DIAGNOSIS — R109 Unspecified abdominal pain: Secondary | ICD-10-CM | POA: Diagnosis not present

## 2018-07-29 DIAGNOSIS — Z5321 Procedure and treatment not carried out due to patient leaving prior to being seen by health care provider: Secondary | ICD-10-CM | POA: Insufficient documentation

## 2018-07-29 MED ORDER — OXYCODONE-ACETAMINOPHEN 5-325 MG PO TABS
1.0000 | ORAL_TABLET | ORAL | Status: DC | PRN
  Administered 2018-07-29: 1 via ORAL
  Filled 2018-07-29: qty 1

## 2018-07-29 NOTE — ED Notes (Signed)
Pt's mother came to desk and stated "We were told it was going to be 4 to 5 hours and [facility] said there is no wait there so can you transfer her there." Informed mother that we couldn't do that. Mother asked if it would be 2 separate ER charges and I informed them that I would. Pt and mother left ED

## 2018-07-29 NOTE — ED Triage Notes (Signed)
Pt had a sudden onset of R sided flank into R and 1.5 hour ago. Pt restless, very uncomfortable. Reports NVD, denies hx of kidney stones

## 2018-07-30 ENCOUNTER — Encounter (HOSPITAL_COMMUNITY): Payer: Self-pay | Admitting: *Deleted

## 2018-07-30 ENCOUNTER — Emergency Department (HOSPITAL_COMMUNITY): Payer: BLUE CROSS/BLUE SHIELD

## 2018-07-30 ENCOUNTER — Emergency Department (HOSPITAL_COMMUNITY)
Admission: EM | Admit: 2018-07-30 | Discharge: 2018-07-30 | Disposition: A | Discharge status: Home / Self Care | Payer: BLUE CROSS/BLUE SHIELD | Attending: Emergency Medicine | Admitting: Emergency Medicine

## 2018-07-30 DIAGNOSIS — R1031 Right lower quadrant pain: Secondary | ICD-10-CM | POA: Diagnosis not present

## 2018-07-30 DIAGNOSIS — Z79899 Other long term (current) drug therapy: Secondary | ICD-10-CM | POA: Insufficient documentation

## 2018-07-30 DIAGNOSIS — N132 Hydronephrosis with renal and ureteral calculous obstruction: Secondary | ICD-10-CM | POA: Diagnosis not present

## 2018-07-30 DIAGNOSIS — N201 Calculus of ureter: Secondary | ICD-10-CM

## 2018-07-30 DIAGNOSIS — N200 Calculus of kidney: Secondary | ICD-10-CM

## 2018-07-30 DIAGNOSIS — N202 Calculus of kidney with calculus of ureter: Secondary | ICD-10-CM | POA: Diagnosis not present

## 2018-07-30 LAB — CBC WITH DIFFERENTIAL/PLATELET
ABS IMMATURE GRANULOCYTES: 0.08 10*3/uL — AB (ref 0.00–0.07)
BASOS PCT: 0 %
Basophils Absolute: 0 10*3/uL (ref 0.0–0.1)
Eosinophils Absolute: 0 10*3/uL (ref 0.0–0.5)
Eosinophils Relative: 0 %
HCT: 41.2 % (ref 36.0–46.0)
Hemoglobin: 12.9 g/dL (ref 12.0–15.0)
Immature Granulocytes: 1 %
Lymphocytes Relative: 11 %
Lymphs Abs: 1.5 10*3/uL (ref 0.7–4.0)
MCH: 26 pg (ref 26.0–34.0)
MCHC: 31.3 g/dL (ref 30.0–36.0)
MCV: 82.9 fL (ref 80.0–100.0)
Monocytes Absolute: 0.6 10*3/uL (ref 0.1–1.0)
Monocytes Relative: 4 %
Neutro Abs: 11.9 10*3/uL — ABNORMAL HIGH (ref 1.7–7.7)
Neutrophils Relative %: 84 %
Platelets: 323 10*3/uL (ref 150–400)
RBC: 4.97 MIL/uL (ref 3.87–5.11)
RDW: 13.4 % (ref 11.5–15.5)
WBC: 14.1 10*3/uL — ABNORMAL HIGH (ref 4.0–10.5)
nRBC: 0 % (ref 0.0–0.2)

## 2018-07-30 LAB — URINALYSIS, ROUTINE W REFLEX MICROSCOPIC
BILIRUBIN URINE: NEGATIVE
Glucose, UA: NEGATIVE mg/dL
KETONES UR: 80 mg/dL — AB
Nitrite: NEGATIVE
Protein, ur: NEGATIVE mg/dL
Specific Gravity, Urine: 1.02 (ref 1.005–1.030)
pH: 5 (ref 5.0–8.0)

## 2018-07-30 LAB — COMPREHENSIVE METABOLIC PANEL
ALT: 36 U/L (ref 0–44)
AST: 29 U/L (ref 15–41)
Albumin: 4.1 g/dL (ref 3.5–5.0)
Alkaline Phosphatase: 52 U/L (ref 38–126)
Anion gap: 10 (ref 5–15)
BUN: 10 mg/dL (ref 6–20)
CO2: 20 mmol/L — ABNORMAL LOW (ref 22–32)
Calcium: 8.7 mg/dL — ABNORMAL LOW (ref 8.9–10.3)
Chloride: 103 mmol/L (ref 98–111)
Creatinine, Ser: 0.9 mg/dL (ref 0.44–1.00)
GFR calc Af Amer: >60 mL/min (ref >60)
Glucose, Bld: 112 mg/dL — ABNORMAL HIGH (ref 70–99)
Potassium: 3.7 mmol/L (ref 3.5–5.1)
SODIUM: 133 mmol/L — AB (ref 135–145)
Total Bilirubin: 0.6 mg/dL (ref 0.3–1.2)
Total Protein: 7.6 g/dL (ref 6.5–8.1)

## 2018-07-30 LAB — POC URINE PREG, ED: Preg Test, Ur: NEGATIVE

## 2018-07-30 LAB — LIPASE, BLOOD: Lipase: 19 U/L (ref 11–51)

## 2018-07-30 MED ORDER — ONDANSETRON HCL 4 MG PO TABS: 4.0000 mg | ORAL_TABLET | Freq: Four times a day (QID) | ORAL | 0 refills | Status: DC | PRN

## 2018-07-30 MED ORDER — KETOROLAC TROMETHAMINE 30 MG/ML IJ SOLN
30.0000 mg | Freq: Once | INTRAMUSCULAR | Status: AC
  Administered 2018-07-30: 30 mg via INTRAVENOUS
  Filled 2018-07-30: qty 1

## 2018-07-30 MED ORDER — MORPHINE SULFATE (PF) 4 MG/ML IV SOLN
4.0000 mg | Freq: Once | INTRAVENOUS | Status: AC
  Administered 2018-07-30: 4 mg via INTRAVENOUS
  Filled 2018-07-30: qty 1

## 2018-07-30 MED ORDER — OXYCODONE-ACETAMINOPHEN 5-325 MG PO TABS: 1.0000 | ORAL_TABLET | ORAL | 0 refills | Status: DC | PRN

## 2018-07-30 MED ORDER — SODIUM CHLORIDE 0.9 % IV BOLUS
1000.0000 mL | Freq: Once | INTRAVENOUS | Status: AC
  Administered 2018-07-30: 1000 mL via INTRAVENOUS

## 2018-07-30 MED ORDER — ONDANSETRON HCL 4 MG PO TABS: 4.0000 mg | ORAL_TABLET | Freq: Three times a day (TID) | ORAL | 0 refills | Status: DC | PRN

## 2018-07-30 MED ORDER — TAMSULOSIN HCL 0.4 MG PO CAPS: 0.4000 mg | ORAL_CAPSULE | Freq: Every day | ORAL | 0 refills | Status: DC

## 2018-07-30 MED ORDER — ONDANSETRON HCL 4 MG/2ML IJ SOLN
4.0000 mg | Freq: Once | INTRAMUSCULAR | Status: AC
  Administered 2018-07-30: 4 mg via INTRAVENOUS
  Filled 2018-07-30: qty 2

## 2018-07-30 NOTE — Discharge Instructions (Addendum)
Please take either ibuprofen or naproxen.  They are anti-inflammatory medicines which can help ease spasm in the ureter and help the stone to pass.  Return if pain is not being adequately controlled at home, or if you start running a fever.  The CT scan suggests that the IUD is not where it should be.  Please make an appointment with your gynecologist to have this evaluated.

## 2018-07-30 NOTE — ED Provider Notes (Signed)
[facility] EMERGENCY DEPARTMENT Provider Note   CSN: [id number] Arrival date & time: 07/30/18  0022    History   Chief Complaint No chief complaint on file.   HPI Rebecca Gardner is a 23 y.o. female.   The history is provided by the patient.  She has history of obesity and depression and comes in complaining of severe right flank pain radiating to the right lower quadrant which started about 9 PM.  Pain came on suddenly and is severe.  She rates pain a 10/10.  Nothing makes better, nothing makes it worse.  There is associated nausea and vomiting.  She did have hematuria earlier today.  She has noted some urinary hesitancy and dribbling.  She does have history of prior UTI.  She went to the emergency department at [facility] in [place] where she was given a dose of oxycodone-acetaminophen with temporary relief of pain.  She left because of excessive waiting times and came here.  Past Medical History:  Diagnosis Date  . Acute lateral meniscus tear of left knee 02/02/2014  . Acute medial meniscus tear of left knee   . Biliary colic   . Concussion with brief (less than one hour) loss of consciousness 4-28 15  . Depression     Patient Active Problem List   Diagnosis Date Noted  . Gall stones 03/27/2016  . 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 Depression 02/05/2016  . Cephalalgia 02/05/2016  . Irregular periods/menstrual cycles 07/30/2015  . Obesity 07/30/2015  . IUD (intrauterine device) in place 07/30/2015  . Acute lateral meniscus tear of left knee 02/02/2014    Past Surgical History:  Procedure Laterality Date  . CHOLECYSTECTOMY N/A 04/10/2016   Procedure: LAPAROSCOPIC CHOLECYSTECTOMY;  Surgeon: Rodman Pickle, MD;  Location: [facility] OR;  Service: General;  Laterality: N/A;  . KNEE ARTHROSCOPY WITH LATERAL MENISECTOMY Left 02/02/2014   Procedure: LEFT KNEE ARTHROSCOPY WITH LATERAL MENISECTOMY;  Surgeon: Eulas Post, MD;  Location: [facility];  Service:  Orthopedics;  Laterality: Left;  . TONSILLECTOMY       OB History    Gravida  0   Para  0   Term  0   Preterm  0   AB  0   Living  0     SAB  0   TAB  0   Ectopic  0   Multiple  0   Live Births               Home Medications    Prior to Admission medications   Medication Sig Start Date End Date Taking? Authorizing Provider  amoxicillin-clavulanate (AUGMENTIN) 875-125 MG tablet Take 1 tablet by mouth 2 (two) times daily. 07/07/18   Junie Spencer, FNP  betamethasone dipropionate (DIPROLENE) 0.05 % cream Apply topically 2 (two) times daily. 04/25/18   Bennie Pierini, FNP  HYDROcodone-acetaminophen (NORCO/VICODIN) 5-325 MG tablet  12/10/17   [provider]  ibuprofen (ADVIL,MOTRIN) 600 MG tablet  12/10/17   [provider]  Levonorgestrel (KYLEENA) 19.5 MG IUD by Intrauterine route.    [provider]  nystatin (MYCOSTATIN/NYSTOP) powder Apply topically 4 (four) times daily as needed. 07/06/16   Johna Sheriff, MD  rizatriptan (MAXALT-MLT) 10 MG disintegrating tablet Take 1 tablet (10 mg total) by mouth as needed for migraine. May repeat in 2 hours if needed 05/14/16   Johna Sheriff, MD  sertraline (ZOLOFT) 100 MG tablet Take full tab once daily 12/13/17   Rex Kras  L, MD    Family History Family History  Problem Relation Age of Onset  . Breast cancer Paternal Grandmother   . Healthy Mother   . Atrial fibrillation Father   . Prostate cancer Paternal Grandfather   . Migraines Maternal Uncle     Social History Social History   Tobacco Use  . Smoking status: Never Smoker  . Smokeless tobacco: Never Used  Substance Use Topics  . Alcohol use: No  . Drug use: No     Allergies   Patient has no known allergies.   Review of Systems Review of Systems  All other systems reviewed and are negative.    Physical Exam Updated Vital Signs BP (!) 146/95   Pulse 75   Temp 98 F (36.7 C) (Oral)   Resp 17   Ht 5\' 8"   (1.727 m)   Wt (!) 157.3 kg   SpO2 100%   BMI 52.73 kg/m   Physical Exam Vitals signs and nursing note reviewed.    Morbidly obese 23 year old female, resting comfortably and in no acute distress. Vital signs are significant for elevated blood pressure. Oxygen saturation is 100%, which is normal. Head is normocephalic and atraumatic. PERRLA, EOMI. Oropharynx is clear. Neck is nontender and supple without adenopathy or JVD. Back is nontender in the midline.  There is moderate right CVA tenderness. Lungs are clear without rales, wheezes, or rhonchi. Chest is nontender. Heart has regular rate and rhythm without murmur. Abdomen is soft, flat, with mild on right lower quadrant tenderness.  There is no rebound or guarding.  There are no masses or hepatosplenomegaly and peristalsis is hypoactive. Extremities have no cyanosis or edema, full range of motion is present. Skin is warm and dry without rash. Neurologic: Mental status is normal, cranial nerves are intact, there are no motor or sensory deficits.  ED Treatments / Results  Labs (all labs ordered are listed, but only abnormal results are displayed) Labs Reviewed  COMPREHENSIVE METABOLIC PANEL - Abnormal; Notable for the following components:      Result Value   Sodium 133 (*)    CO2 20 (*)    Glucose, Bld 112 (*)    Calcium 8.7 (*)    All other components within normal limits  CBC WITH DIFFERENTIAL/PLATELET - Abnormal; Notable for the following components:   WBC 14.1 (*)    Neutro Abs 11.9 (*)    Abs Immature Granulocytes 0.08 (*)    All other components within normal limits  URINALYSIS, ROUTINE W REFLEX MICROSCOPIC - Abnormal; Notable for the following components:   APPearance HAZY (*)    Hgb urine dipstick LARGE (*)    Ketones, ur 80 (*)    Leukocytes,Ua SMALL (*)    RBC / HPF >50 (*)    Bacteria, UA RARE (*)    All other components within normal limits  LIPASE, BLOOD  POC URINE PREG, ED  POC URINE PREG, ED     Radiology Ct Renal Stone Study  Result Date: 07/30/2018 CLINICAL DATA:  Right-sided flank pain EXAM: CT ABDOMEN AND PELVIS WITHOUT CONTRAST TECHNIQUE: Multidetector CT imaging of the abdomen and pelvis was performed following the standard protocol without IV contrast. COMPARISON:  04/25/2018 FINDINGS: Lower chest: Lung bases demonstrate no acute consolidation or effusion. Heart size is normal. Hepatobiliary: No focal liver abnormality is seen. Status post cholecystectomy. No biliary dilatation. Pancreas: Unremarkable. No pancreatic ductal dilatation or surrounding inflammatory changes. Spleen: Normal in size without focal abnormality. Adrenals/Urinary Tract: Adrenal glands  are normal. Multiple intrarenal stones bilaterally. Largest stone on the left is seen in the midpole and measures 4 mm. Largest stone on the right is seen in the midpole and measures 3 mm. Mild right hydronephrosis and hydroureter, secondary to a 3 mm stone in the distal right ureter just cephalad to the right UVJ. Stomach/Bowel: Stomach is within normal limits. Appendix appears normal. No evidence of bowel wall thickening, distention, or inflammatory changes. Vascular/Lymphatic: No significant vascular findings are present. No enlarged abdominal or pelvic lymph nodes. Reproductive: No adnexal mass. Malpositioned intrauterine device within the cervix. Possible perforation of T prongs through the lower uterine segment with oblique orientation of the T prongs. Other: Negative for free air or free fluid. Musculoskeletal: No acute or significant osseous findings. IMPRESSION: 1. Mild right hydronephrosis and hydroureter, secondary to a 3 mm stone in the distal right ureter just proximal to the UVJ. 2. Multiple intrarenal calculi bilaterally 3. Malposition intrauterine device which is visible in the cervix. There is possible perforation of the lower uterine segment by the T arms of the intrauterine device which is obliquely oriented.  Electronically Signed   By: Jasmine Pang M.D.   On: 07/30/2018 02:34    Procedures Procedures   Medications Ordered in ED Medications  sodium chloride 0.9 % bolus 1,000 mL (1,000 mLs Intravenous New Bag/Given 07/30/18 0131)  ondansetron (ZOFRAN) injection 4 mg (4 mg Intravenous Given 07/30/18 0125)  ketorolac (TORADOL) 30 MG/ML injection 30 mg (30 mg Intravenous Given 07/30/18 0125)  morphine 4 MG/ML injection 4 mg (4 mg Intravenous Given 07/30/18 0125)     Initial Impression / Assessment and Plan / ED Course  I have reviewed the triage vital signs and the nursing notes.  Pertinent labs & imaging results that were available during my care of the patient were reviewed by me and considered in my medical decision making (see chart for details).  Right flank pain suspicious for renal colic.  Old records are reviewed, and she was at [facility], ED earlier tonight, but no prior similar episodes.  She will be given IV fluids, morphine, ketorolac, ondansetron and will be sent for renal stone protocol CT scan.  She had excellent relief of pain with above-noted treatment.  CT scan confirms 3 mm distal right ureteral calculus as well as multiple intrarenal calculi.  Incidental finding on CT scan is apparent malposition of IUD with possible perforation of large lower uterine segment.  Her abdominal exam is benign and I do not feel this needs to be managed emergently.  Patient is advised of the finding and is referred to her gynecologist for follow-up in the next week.  Given strict return precautions.  She is discharged with prescriptions for tamsulosin, ondansetron, oxycodone-acetaminophen.  She states that she has naproxen at home from prior knee surgery and she is advised to take that as well.  Final Clinical Impressions(s) / ED Diagnoses   Final diagnoses:  Ureterolithiasis  Nephrolithiasis    ED Discharge Orders         Ordered    oxyCODONE-acetaminophen (PERCOCET) 5-325 MG tablet  Every 4  hours PRN     07/30/18 0306    ondansetron (ZOFRAN) 4 MG tablet  Every 8 hours PRN     07/30/18 0306    ondansetron (ZOFRAN) 4 MG tablet  Every 6 hours PRN     07/30/18 0306    tamsulosin (FLOMAX) 0.4 MG CAPS capsule  Daily     07/30/18 0306    oxyCODONE-acetaminophen (  PERCOCET) 5-325 MG tablet  Every 4 hours PRN,   Status:  Discontinued     07/30/18 0306    oxyCODONE-acetaminophen (PERCOCET) 5-325 MG tablet  Every 4 hours PRN     07/30/18 0307           Dione BoozeGlick, Joseandres Mazer, MD 07/30/18 [phone]

## 2018-07-30 NOTE — ED Triage Notes (Signed)
Pt c/o right side flank pain that started around 2100 this evening; pt has seen blood in her urine; pt states she has pain with urination

## 2018-08-01 ENCOUNTER — Telehealth: Payer: Self-pay | Admitting: *Deleted

## 2018-08-01 DIAGNOSIS — T8332XA Displacement of intrauterine contraceptive device, initial encounter: Secondary | ICD-10-CM

## 2018-08-01 MED FILL — Oxycodone w/ Acetaminophen Tab 5-325 MG: ORAL | Qty: 6 | Status: AC

## 2018-08-01 MED FILL — Ondansetron HCl Tab 4 MG: ORAL | Qty: 4 | Status: AC

## 2018-08-01 MED FILL — Ondansetron HCl Inj 4 MG/2ML (2 MG/ML): INTRAMUSCULAR | Qty: 2 | Status: CN

## 2018-08-01 NOTE — Telephone Encounter (Signed)
Call to patient to follow-up on ED visit this weekend. States she feel like she is passing kidney stones.  Continues to have  pain similar to Friday night. Is to see urologist for follow-up. Advised Dr Hyacinth Meeker recommends pelvic ultrasound here in office to assess IUD position, possible removal. Patient declines appointment tomorrow due to school. Appointment scheduled for Thursday, 08-04-2018 at 330 due to school. Again, offered appointment 2-25 ( tomorrow) and patient declined. Cant get here from school during business hours.

## 2018-08-02 ENCOUNTER — Telehealth: Payer: Self-pay | Admitting: Obstetrics & Gynecology

## 2018-08-02 ENCOUNTER — Other Ambulatory Visit: Payer: Self-pay | Admitting: Obstetrics & Gynecology

## 2018-08-02 ENCOUNTER — Other Ambulatory Visit: Payer: Self-pay

## 2018-08-02 NOTE — Telephone Encounter (Signed)
Spoke with patient. PUS rescheduled to today at 4:30pm, consult to follow at 4:45pm with Dr. Hyacinth Meeker. Patient verbalizes understanding and is agreeable.   Routing to [employer].   Encounter closed.

## 2018-08-02 NOTE — Telephone Encounter (Signed)
Patient wants to move her ultrasound appointment for 08/04/18 to today 08/02/18. I did tell the patient we do not have any available slots left for today. Patient states she was offered the 4:30 ultrasound slot.

## 2018-08-04 ENCOUNTER — Other Ambulatory Visit: Payer: BLUE CROSS/BLUE SHIELD | Admitting: Obstetrics & Gynecology

## 2018-08-04 ENCOUNTER — Other Ambulatory Visit: Payer: Self-pay

## 2018-08-04 ENCOUNTER — Ambulatory Visit: Payer: BLUE CROSS/BLUE SHIELD | Admitting: Obstetrics & Gynecology

## 2018-08-04 ENCOUNTER — Other Ambulatory Visit: Payer: BLUE CROSS/BLUE SHIELD

## 2018-08-04 ENCOUNTER — Ambulatory Visit (INDEPENDENT_AMBULATORY_CARE_PROVIDER_SITE_OTHER): Payer: BLUE CROSS/BLUE SHIELD

## 2018-08-04 VITALS — BP 146/88 | HR 88 | Resp 16 | Ht 68.0 in | Wt 341.0 lb

## 2018-08-04 DIAGNOSIS — N898 Other specified noninflammatory disorders of vagina: Secondary | ICD-10-CM

## 2018-08-04 DIAGNOSIS — T8332XA Displacement of intrauterine contraceptive device, initial encounter: Secondary | ICD-10-CM

## 2018-08-04 MED ORDER — METRONIDAZOLE 500 MG PO TABS: 500.0000 mg | ORAL_TABLET | Freq: Two times a day (BID) | ORAL | 0 refills | Status: DC

## 2018-08-04 MED ORDER — NORETHINDRONE 0.35 MG PO TABS: 1.0000 | ORAL_TABLET | Freq: Every day | ORAL | 3 refills | Status: DC

## 2018-08-04 NOTE — Progress Notes (Signed)
23 y.o. G0P0000 Single White or Caucasian female here for pelvic ultrasound due to malpositioned IUD noted on CT when she was in ER 07/30/2018 for kidney stones.  Reports she started having painful intercourse about 4 months ago.  Rutha Bouchard IUD was placed 07/30/15 and pt was last see in 2018.  IUD string was 2cm on exam at that time.  Pt knows IUD needs to be removed.  Will need contraception as well.  Wants to go back on OCP but cannot take combination OCP due to papilledema.  IUD, Depo Provera, Nexplanon, POPs are her options.  Would like an oral method.  Administration and taking at regular time encouraged.    Contraception: IUD  Findings:  UTERUS: 8.1 x 4.0 x 3.7cm EMS: 7.41mm ADNEXA: Left ovary:  3.4 x 2.9 x 3.1cm       Right ovary: 4.5 x 2.8 x 2.4cm.  Both ovaries are enlarged c/w PCOS CUL DE SAC: no free fluid IUD is in cervix and appears very close to cervical os  Discussion:  Images reviewed with pt.  Due to location of IUD with possible arm extension through cervix, feel should try to removed today in office.  If difficult to remove, will plan to do this in the OR with laparoscopy if needed.  This is so low, it does not look like it will be in the pelvic cavity, however.  Pt comfortable with attempted removal today.  Procedure:  Speculum placed.  IUD string noted and is about 5cm in length.  Grasped and IUD removed with gentle tug.  No bleeding noted. Pt denies pain.    There is vaginal odor present so vaginitis swab was obtained today as well.  Assessment:  Malpositioned Kyleena IUD in cervix Morbid obesity PCOS  Plan:  IUD removal today Will start micronor for contraception.  She should not use estrogen products due to papilledema Needs AEX.  Will schedule for pt Vaginitis swab obtained today. Will start flagyl 500mg  bid x 7 days  ~25 minutes spent with patient >50% of time was in face to face discussion of above in addition to IUD removal.

## 2018-08-05 ENCOUNTER — Encounter: Payer: Self-pay | Admitting: Obstetrics & Gynecology

## 2018-08-07 LAB — NUSWAB VAGINITIS PLUS (VG+)
CHLAMYDIA TRACHOMATIS, NAA: NEGATIVE
Candida albicans, NAA: POSITIVE — AB
Candida glabrata, NAA: NEGATIVE
Neisseria gonorrhoeae, NAA: NEGATIVE
Trich vag by NAA: NEGATIVE

## 2018-08-08 ENCOUNTER — Telehealth: Payer: Self-pay | Admitting: *Deleted

## 2018-08-08 ENCOUNTER — Encounter: Payer: Self-pay | Admitting: Obstetrics & Gynecology

## 2018-08-08 MED ORDER — TERCONAZOLE 0.4 % VA CREA: 1.0000 | TOPICAL_CREAM | Freq: Every day | VAGINAL | 0 refills | Status: AC

## 2018-08-08 NOTE — Telephone Encounter (Signed)
Called patient.  Unable to leave message.  Voicemail not set up  

## 2018-08-08 NOTE — Telephone Encounter (Signed)
-----   Message from Jerene Bears, MD sent at 08/08/2018 12:48 PM EST ----- Please let pt know her vaginitis swab showed yeast.  Ok to treat with terazol 7, one applicator nightly x 7 nights.  She should also finish the oral antibiotic I gave her at the last visit.  If discharge and odor does not fully resolve, she should return before her April appt.  Thanks.

## 2018-08-08 NOTE — Telephone Encounter (Signed)
Pt returned call.  Message from Dr. Hyacinth Meeker given.  Pt given instructions on use of Terazol 7 and accepts prescription.  Will continue with flagyl po and follow up if not improved.  Encounter closed.

## 2018-10-06 ENCOUNTER — Ambulatory Visit: Payer: BLUE CROSS/BLUE SHIELD | Admitting: Obstetrics & Gynecology

## 2019-01-05 ENCOUNTER — Other Ambulatory Visit: Payer: Self-pay

## 2019-01-10 ENCOUNTER — Other Ambulatory Visit: Payer: Self-pay

## 2019-01-10 ENCOUNTER — Telehealth: Payer: Self-pay | Admitting: *Deleted

## 2019-01-10 ENCOUNTER — Ambulatory Visit: Payer: 59 | Admitting: Obstetrics & Gynecology

## 2019-01-10 ENCOUNTER — Encounter: Payer: Self-pay | Admitting: Obstetrics & Gynecology

## 2019-01-10 VITALS — BP 136/86 | HR 88 | Temp 97.7°F | Ht 68.0 in | Wt 333.0 lb

## 2019-01-10 DIAGNOSIS — Z3201 Encounter for pregnancy test, result positive: Secondary | ICD-10-CM | POA: Diagnosis not present

## 2019-01-10 DIAGNOSIS — N912 Amenorrhea, unspecified: Secondary | ICD-10-CM

## 2019-01-10 LAB — POCT URINE PREGNANCY: Preg Test, Ur: POSITIVE — AB

## 2019-01-10 NOTE — Progress Notes (Signed)
23 y.o. G0P0000 Single White or Caucasian female here for pregnancy conformation.  She hasn't been using any contraception.  She's been with her boyfriend for about 9 months.  She is having some breast tenderness, fatigue, and nausea.  She has thrown up some.  She is not having trouble keeping down liquids.  Is having some pelvic cramping at night.  Denies vaginal bleeding.  Based on LMP, EDC is 08/29/2019  Has donated blood in the past.  Blood type is A+.    She is not taking a PNV.  She stopped her zoloft a couple of months ago.    Worried about her weight and pregnancy.  D/w pt recommended weight gain for her in pregnancy and exercise/nutritional services that may be beneficial in pregnancy.  Diabetes risks and hypertensive disease risks reviewed as well.    Patient's last menstrual period was 11/22/2018 (approximate).          Sexually active: Yes.    The current method of family planning is none.    Exercising: Yes.     Smoker:  no  Health Maintenance: Pap:  06/11/16 neg  History of abnormal Pap:  no TDaP:  2018 Gardasil: completed  Screening Labs: PCP   reports that she has never smoked. She has never used smokeless tobacco. She reports that she does not drink alcohol or use drugs.  Past Medical History:  Diagnosis Date  . Acute lateral meniscus tear of left knee 02/02/2014  . Acute medial meniscus tear of left knee   . Concussion with brief (less than one hour) loss of consciousness 4-28 15  . Depression     Past Surgical History:  Procedure Laterality Date  . CHOLECYSTECTOMY N/A 04/10/2016   Procedure: LAPAROSCOPIC CHOLECYSTECTOMY;  Surgeon: Rodman PickleLuke Aaron Kinsinger, MD;  [facility];  Service: General;  Laterality: N/A;  . KNEE ARTHROSCOPY WITH LATERAL MENISECTOMY Left 02/02/2014   Procedure: LEFT KNEE ARTHROSCOPY WITH LATERAL MENISECTOMY;  Surgeon: Eulas PostJoshua P Landau, MD;  Location: [facility];  Service: Orthopedics;  Laterality: Left;  . TONSILLECTOMY       Current Outpatient Medications  Medication Sig Dispense Refill  . betamethasone dipropionate (DIPROLENE) 0.05 % cream Apply topically 2 (two) times daily. 30 g 2  . ondansetron (ZOFRAN) 4 MG tablet Take 1 tablet (4 mg total) by mouth every 8 (eight) hours as needed for nausea or vomiting. 4 tablet 0  . rizatriptan (MAXALT-MLT) 10 MG disintegrating tablet Take 1 tablet (10 mg total) by mouth as needed for migraine. May repeat in 2 hours if needed 10 tablet 0  . ibuprofen (ADVIL,MOTRIN) 600 MG tablet   0   No current facility-administered medications for this visit.     Family History  Problem Relation Age of Onset  . Breast cancer Paternal Grandmother   . Healthy Mother   . Atrial fibrillation Father   . Prostate cancer Paternal Grandfather   . Migraines Maternal Uncle     Review of Systems  Gastrointestinal: Positive for nausea.  All other systems reviewed and are negative.   Exam:   BP 136/86   Pulse 88   Temp 97.7 F (36.5 C) (Temporal)   Ht 5\' 8"  (1.727 m)   Wt (!) 333 lb (151 kg)   LMP 11/22/2018 (Approximate)   BMI 50.63 kg/m    Height: 5\' 8"  (172.7 cm)  Ht Readings from Last 3 Encounters:  01/10/19 5\' 8"  (1.727 m)  08/04/18 5\' 8"  (1.727 m)  07/30/18 5\' 8"  (  1.727 m)    General appearance: alert, cooperative and appears stated age  A:  Amenorrhea Morbid obesity   P:   Plan viability scan.  Will schedule for pt. Start PNV ASAP Options for Ob care discussed.  She will transfer care to [facility] when appropriate  ~20 minutes spent with patient >50% of time was in face to face discussion of above.

## 2019-01-10 NOTE — Telephone Encounter (Signed)
Call reviewed with Dr. Sabra Heck, call returned to patient.   Patient declines PUS in office on 8/6 or 8/11 due to her work schedule. PUS scheduled for 8/13 at 10am, consult to follow at 10:30am with Dr. Quincy Simmonds. Order placed for precert. Patient verbalizes understanding and is agreeable.   Routing to provider for final review. Patient is agreeable to disposition. Will close encounter.  Cc: Dr. Quincy Simmonds, Lerry L[place], [place]

## 2019-01-10 NOTE — Telephone Encounter (Signed)
STAT Order placed for US OB less than 14 wks with OB transvaginal.   Call placed to [facility] scheduling, spoke with [facility]. Was advised first available STAT US on 01/12/19 at 3:45pm. Not scheduled, advised I would need to review with provider and return call.

## 2019-01-12 ENCOUNTER — Telehealth: Payer: Self-pay | Admitting: Obstetrics and Gynecology

## 2019-01-12 NOTE — Telephone Encounter (Signed)
Call placed to patient to patient to review benefit for scheduled ultrasound. Unable to leave a voicemail, as voice mailbox was full

## 2019-01-16 NOTE — Telephone Encounter (Signed)
Spoke with patient regarding benefit for scheduled ultrasound appointment. Patient acknowledges understanding of information presented. Patient is scheduled 01/19/2019 with Dr Quincy Simmonds. Patient is aware of the appointment date, arrival time and cancellation policy. No further questions. Will close encounter

## 2019-01-17 ENCOUNTER — Other Ambulatory Visit: Payer: Self-pay

## 2019-01-19 ENCOUNTER — Ambulatory Visit (INDEPENDENT_AMBULATORY_CARE_PROVIDER_SITE_OTHER): Payer: 59

## 2019-01-19 ENCOUNTER — Ambulatory Visit (INDEPENDENT_AMBULATORY_CARE_PROVIDER_SITE_OTHER): Payer: 59 | Admitting: Obstetrics and Gynecology

## 2019-01-19 ENCOUNTER — Telehealth: Payer: Self-pay | Admitting: Obstetrics & Gynecology

## 2019-01-19 ENCOUNTER — Encounter: Payer: Self-pay | Admitting: Obstetrics and Gynecology

## 2019-01-19 ENCOUNTER — Other Ambulatory Visit: Payer: Self-pay

## 2019-01-19 VITALS — BP 130/78 | HR 80 | Temp 97.7°F | Ht 68.0 in | Wt 332.0 lb

## 2019-01-19 DIAGNOSIS — Z3201 Encounter for pregnancy test, result positive: Secondary | ICD-10-CM

## 2019-01-19 DIAGNOSIS — N912 Amenorrhea, unspecified: Secondary | ICD-10-CM

## 2019-01-19 DIAGNOSIS — Z3687 Encounter for antenatal screening for uncertain dates: Secondary | ICD-10-CM | POA: Diagnosis not present

## 2019-01-19 DIAGNOSIS — Z3490 Encounter for supervision of normal pregnancy, unspecified, unspecified trimester: Secondary | ICD-10-CM

## 2019-01-19 NOTE — Progress Notes (Signed)
GYNECOLOGY  VISIT   HPI: 23 y.o.   Single  Caucasian  female   G1P0000 with Patient's last menstrual period was 11/22/2018 (approximate).   here for viability ultrasound.    Having nausea and is not vomiting.  Eating solid and liquid food.  Lost a pound since her last visit here.  Is able to take a prenatal vitamin.   Positive UPT on 01/10/19 in office.  No bleeding or pain.   She will establish care at [facility] in [place].  She is familiar with this practice.   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   GYNECOLOGIC HISTORY: Patient's last menstrual period was 11/22/2018 (approximate). Contraception: none Menopausal hormone therapy:  n/a Last mammogram:  n/a Last pap smear:  06-11-16 Neg        OB History    Gravida  1   Para  0   Term  0   Preterm  0   AB  0   Living  0     SAB  0   TAB  0   Ectopic  0   Multiple  0   Live Births                 Patient Active Problem List   Diagnosis Date Noted  . Gall stones 03/27/2016  . 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 Depression 02/05/2016  . Cephalalgia 02/05/2016  . Irregular periods/menstrual cycles 07/30/2015  . Obesity 07/30/2015  . Acute lateral meniscus tear of left knee 02/02/2014    Past Medical History:  Diagnosis Date  . Acute lateral meniscus tear of left knee 02/02/2014  . Acute medial meniscus tear of left knee   . Concussion with brief (less than one hour) loss of consciousness 4-28 15  . Depression     Past Surgical History:  Procedure Laterality Date  . CHOLECYSTECTOMY N/A 04/10/2016   Procedure: LAPAROSCOPIC CHOLECYSTECTOMY;  Surgeon: Mickeal Skinner, MD;  Location: [facility];  Service: General;  Laterality: N/A;  . KNEE ARTHROSCOPY WITH LATERAL MENISECTOMY Left 02/02/2014   Procedure: LEFT KNEE ARTHROSCOPY WITH LATERAL MENISECTOMY;  Surgeon: Johnny Bridge, MD;  Location: [facility];  Service: Orthopedics;  Laterality: Left;  . TONSILLECTOMY      Current  Outpatient Medications  Medication Sig Dispense Refill  . betamethasone dipropionate (DIPROLENE) 0.05 % cream Apply topically 2 (two) times daily. 30 g 2  . ondansetron (ZOFRAN) 4 MG tablet Take 1 tablet (4 mg total) by mouth every 8 (eight) hours as needed for nausea or vomiting. (Patient not taking: Reported on 01/19/2019) 4 tablet 0  . rizatriptan (MAXALT-MLT) 10 MG disintegrating tablet Take 1 tablet (10 mg total) by mouth as needed for migraine. May repeat in 2 hours if needed (Patient not taking: Reported on 01/19/2019) 10 tablet 0   No current facility-administered medications for this visit.      ALLERGIES: Patient has no known allergies.  Family History  Problem Relation Age of Onset  . Breast cancer Paternal Grandmother   . Healthy Mother   . Atrial fibrillation Father   . Prostate cancer Paternal Grandfather   . Migraines Maternal Uncle     Social History   Socioeconomic History  . Marital status: Single    Spouse name: Not on file  . Number of children: 0  . Years of education: college  . Highest education level: Not on file  Occupational History  . Occupation: Oceanographer  Social Needs  . Financial  resource strain: Not on file  . Food insecurity    Worry: Not on file    Inability: Not on file  . Transportation needs    Medical: Not on file    Non-medical: Not on file  Tobacco Use  . Smoking status: Never Smoker  . Smokeless tobacco: Never Used  Substance and Sexual Activity  . Alcohol use: No  . Drug use: No  . Sexual activity: Yes    Birth control/protection: Condom  Lifestyle  . Physical activity    Days per week: Not on file    Minutes per session: Not on file  . Stress: Not on file  Relationships  . Social Musicianconnections    Talks on phone: Not on file    Gets together: Not on file    Attends religious service: Not on file    Active member of club or organization: Not on file    Attends meetings of clubs or organizations: Not on file     Relationship status: Not on file  . Intimate partner violence    Fear of current or ex partner: Not on file    Emotionally abused: Not on file    Physically abused: Not on file    Forced sexual activity: Not on file  Other Topics Concern  . Not on file  Social History Narrative   Lives with mom and dad.  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  No children.     Education: Associates, currently in college with interest in ancillary medical services    Review of Systems  All other systems reviewed and are negative.   PHYSICAL EXAMINATION:    BP 130/78 (Cuff Size: Large)   Pulse 80   Temp 97.7 F (36.5 C) (Temporal)   Ht 5\' 8"  (1.727 m)   Wt (!) 332 lb (150.6 kg)   LMP 11/22/2018 (Approximate)   BMI 50.48 kg/m     General appearance: alert, cooperative and appears stated age  OB US Uterus - viable IUP.   Size equal dates.  [facility] FH 163 beats per minute.  Right ovary with 33 mm simple cyst.  Left ovary normal. No free fluid.  ASSESSMENT  Early viable pregnancy.  Size equal to dates.   PLAN  Reassurance regarding early pregnancy development.  We discussed exercise, sex in pregnancy, early genetic testing.  We reviewed What to Expect When Expecting as a good resource for her.  She will establish care with her OB office.  Best wishes!   An After Visit Summary was printed and given to the patient.  ____15__ minutes face to face time of which over 50% was spent in counseling.

## 2019-01-19 NOTE — Telephone Encounter (Signed)
Patient called, stated that she had an ultrasound today, it's in Epic.  She stated they told her to call here to establish care as they are just GYN.  Please review and advise.  [phone]

## 2019-01-19 NOTE — Telephone Encounter (Signed)
How far along is she now?

## 2019-01-19 NOTE — Patient Instructions (Signed)
Congratulations!  Dr. Sabra Heck will see you back after your delivery care is complete.

## 2019-01-24 NOTE — Telephone Encounter (Signed)
Patient is scheduled for 02/20/19.

## 2019-01-26 ENCOUNTER — Encounter: Payer: Self-pay | Admitting: Obstetrics & Gynecology

## 2019-01-26 ENCOUNTER — Telehealth: Payer: Self-pay | Admitting: Obstetrics & Gynecology

## 2019-01-26 NOTE — Telephone Encounter (Signed)
Dr. Sabra Heck -patient was seen in office for viability scan on 01/19/19 by Dr. Quincy Simmonds. Patient to establish care with OB.   Please review and advise.

## 2019-01-26 NOTE — Telephone Encounter (Signed)
Patient sent the following correspondence through [facility].  Hey Dr. Sabra Heck, I have been having problems going and staying asleep and wanted to know if there was any safe forms of sleep aids I could take while pregnant?

## 2019-01-26 NOTE — Telephone Encounter (Signed)
Spoke with patient, advised as seen below per Dr. Sabra Heck.   Patient is scheduled for first appt with [facility] on 02/20/19.   Patient asking if Tylenol PM can be taken instead of benadryl? Advised Tylenol also Class B medication in pregnancy, for short term use. Will review with Dr. Sabra Heck and return call if any additional recommendations.    Routing to Dr. Sabra Heck for final review.

## 2019-01-26 NOTE — Telephone Encounter (Signed)
Please make sure she's called for new Ob appt with ob/gyn.  I cannot give her any advised after 13 weeks.  Benadryl is a class B medication in pregnancy so she can try this.

## 2019-02-17 ENCOUNTER — Other Ambulatory Visit: Payer: Self-pay | Admitting: Obstetrics and Gynecology

## 2019-02-17 DIAGNOSIS — Z3682 Encounter for antenatal screening for nuchal translucency: Secondary | ICD-10-CM

## 2019-02-20 ENCOUNTER — Other Ambulatory Visit: Payer: Self-pay

## 2019-02-20 ENCOUNTER — Ambulatory Visit: Payer: 59 | Admitting: *Deleted

## 2019-02-20 ENCOUNTER — Encounter: Payer: Self-pay | Admitting: Women's Health

## 2019-02-20 ENCOUNTER — Ambulatory Visit (INDEPENDENT_AMBULATORY_CARE_PROVIDER_SITE_OTHER): Payer: 59

## 2019-02-20 ENCOUNTER — Ambulatory Visit (INDEPENDENT_AMBULATORY_CARE_PROVIDER_SITE_OTHER): Payer: 59 | Admitting: Women's Health

## 2019-02-20 VITALS — BP 127/78 | HR 91 | Wt 329.0 lb

## 2019-02-20 DIAGNOSIS — Z3401 Encounter for supervision of normal first pregnancy, first trimester: Secondary | ICD-10-CM | POA: Diagnosis not present

## 2019-02-20 DIAGNOSIS — Z3682 Encounter for antenatal screening for nuchal translucency: Secondary | ICD-10-CM

## 2019-02-20 DIAGNOSIS — Z1379 Encounter for other screening for genetic and chromosomal anomalies: Secondary | ICD-10-CM | POA: Diagnosis not present

## 2019-02-20 DIAGNOSIS — Z1389 Encounter for screening for other disorder: Secondary | ICD-10-CM

## 2019-02-20 DIAGNOSIS — Z363 Encounter for antenatal screening for malformations: Secondary | ICD-10-CM

## 2019-02-20 DIAGNOSIS — Z3A12 12 weeks gestation of pregnancy: Secondary | ICD-10-CM

## 2019-02-20 DIAGNOSIS — Z1371 Encounter for nonprocreative screening for genetic disease carrier status: Secondary | ICD-10-CM | POA: Diagnosis not present

## 2019-02-20 DIAGNOSIS — F418 Other specified anxiety disorders: Secondary | ICD-10-CM

## 2019-02-20 DIAGNOSIS — Z3400 Encounter for supervision of normal first pregnancy, unspecified trimester: Secondary | ICD-10-CM | POA: Insufficient documentation

## 2019-02-20 DIAGNOSIS — O0990 Supervision of high risk pregnancy, unspecified, unspecified trimester: Secondary | ICD-10-CM | POA: Insufficient documentation

## 2019-02-20 DIAGNOSIS — N83201 Unspecified ovarian cyst, right side: Secondary | ICD-10-CM

## 2019-02-20 DIAGNOSIS — Z331 Pregnant state, incidental: Secondary | ICD-10-CM

## 2019-02-20 LAB — POCT URINALYSIS DIPSTICK OB
Glucose, UA: NEGATIVE
Leukocytes, UA: NEGATIVE
Nitrite, UA: NEGATIVE
POC,PROTEIN,UA: NEGATIVE

## 2019-02-20 NOTE — Patient Instructions (Signed)
Rebecca Gardner, I greatly value your feedback.  If you receive a survey following your visit with [place] today, we appreciate you taking the time to fill it out.  Thanks, Knute Neu, CNM, [facility]  [place]!!! It is now [place] at [facility] ([place], [place] [address]) Entrance located off of [address] parking   Nausea & Vomiting  Have saltine crackers or pretzels by your bed and eat a few bites before you raise your head out of bed in the morning  Eat small frequent meals throughout the day instead of large meals  Drink plenty of fluids throughout the day to stay hydrated, just don't drink a lot of fluids with your meals.  This can make your stomach fill up faster making you feel sick  Do not brush your teeth right after you eat  Products with real ginger are good for nausea, like ginger ale and ginger hard candy Make sure it says made with real ginger!  Sucking on sour candy like lemon heads is also good for nausea  If your prenatal vitamins make you nauseated, take them at night so you will sleep through the nausea  Sea Bands  If you feel like you need medicine for the nausea & vomiting please let us know  If you are unable to keep any fluids or food down please let us know   Constipation  Drink plenty of fluid, preferably water, throughout the day  Eat foods high in fiber such as fruits, vegetables, and grains  Exercise, such as walking, is a good way to keep your bowels regular  Drink warm fluids, especially warm prune juice, or decaf coffee  Eat a 1/2 cup of real oatmeal (not instant), 1/2 cup applesauce, and 1/2-1 cup warm prune juice every day  If needed, you may take Colace (docusate sodium) stool softener once or twice a day to help keep the stool soft.   If you still are having problems with constipation, you may take Miralax once daily as needed to help keep your bowels regular.   Home Blood  Pressure Monitoring for Patients   Your provider has recommended that you check your blood pressure (BP) at least once a week at home. If you do not have a blood pressure cuff at home, one will be provided for you. Contact your provider if you have not received your monitor within 1 week.   Helpful Tips for Accurate Home Blood Pressure Checks  . Don't smoke, exercise, or drink caffeine 30 minutes before checking your BP . Use the restroom before checking your BP (a full bladder can raise your pressure) . Relax in a comfortable upright chair . Feet on the ground . Left arm resting comfortably on a flat surface at the level of your heart . Legs uncrossed . Back supported . Sit quietly and don't talk . Place the cuff on your bare arm . Adjust snuggly, so that only two fingertips can fit between your skin and the top of the cuff . Check 2 readings separated by at least one minute . Keep a log of your BP readings . For a visual, please reference this diagram: http://ccnc.care/bpdiagram  Provider Name: [facility]     Phone: [phone]  Zone 1: ALL CLEAR  Continue to monitor your symptoms:  . BP reading is less than 140 (top number) or less than 90 (bottom number)  . No right upper stomach  pain . No headaches or seeing spots . No feeling nauseated or throwing up . No swelling in face and hands  Zone 2: CAUTION Call your doctor's office for any of the following:  . BP reading is greater than 140 (top number) or greater than 90 (bottom number)  . Stomach pain under your ribs in the middle or right side . Headaches or seeing spots . Feeling nauseated or throwing up . Swelling in face and hands  Zone 3: EMERGENCY  Seek immediate medical care if you have any of the following:  . BP reading is greater than160 (top number) or greater than 110 (bottom number) . Severe headaches not improving with Tylenol . Serious difficulty catching your breath . Any worsening symptoms from Zone  2    First Trimester of Pregnancy The first trimester of pregnancy is from week 1 until the end of week 12 (months 1 through 3). A week after a sperm fertilizes an egg, the egg will implant on the wall of the uterus. This embryo will begin to develop into a baby. Genes from you and your partner are forming the baby. The female genes determine whether the baby is a boy or a girl. At 6-8 weeks, the eyes and face are formed, and the heartbeat can be seen on ultrasound. At the end of 12 weeks, all the baby's organs are formed.  Now that you are pregnant, you will want to do everything you can to have a healthy baby. Two of the most important things are to get good prenatal care and to follow your health care provider's instructions. Prenatal care is all the medical care you receive before the baby's birth. This care will help prevent, find, and treat any problems during the pregnancy and childbirth. BODY CHANGES Your body goes through many changes during pregnancy. The changes vary from woman to woman.   You may gain or lose a couple of pounds at first.  You may feel sick to your stomach (nauseous) and throw up (vomit). If the vomiting is uncontrollable, call your health care provider.  You may tire easily.  You may develop headaches that can be relieved by medicines approved by your health care provider.  You may urinate more often. Painful urination may mean you have a bladder infection.  You may develop heartburn as a result of your pregnancy.  You may develop constipation because certain hormones are causing the muscles that push waste through your intestines to slow down.  You may develop hemorrhoids or swollen, bulging veins (varicose veins).  Your breasts may begin to grow larger and become tender. Your nipples may stick out more, and the tissue that surrounds them (areola) may become darker.  Your gums may bleed and may be sensitive to brushing and flossing.  Dark spots or blotches  (chloasma, mask of pregnancy) may develop on your face. This will likely fade after the baby is born.  Your menstrual periods will stop.  You may have a loss of appetite.  You may develop cravings for certain kinds of food.  You may have changes in your emotions from day to day, such as being excited to be pregnant or being concerned that something may go wrong with the pregnancy and baby.  You may have more vivid and strange dreams.  You may have changes in your hair. These can include thickening of your hair, rapid growth, and changes in texture. Some women also have hair loss during or after pregnancy, or hair that  feels dry or thin. Your hair will most likely return to normal after your baby is born. WHAT TO EXPECT AT YOUR PRENATAL VISITS During a routine prenatal visit:  You will be weighed to make sure you and the baby are growing normally.  Your blood pressure will be taken.  Your abdomen will be measured to track your baby's growth.  The fetal heartbeat will be listened to starting around week 10 or 12 of your pregnancy.  Test results from any previous visits will be discussed. Your health care provider may ask you:  How you are feeling.  If you are feeling the baby move.  If you have had any abnormal symptoms, such as leaking fluid, bleeding, severe headaches, or abdominal cramping.  If you have any questions. Other tests that may be performed during your first trimester include:  Blood tests to find your blood type and to check for the presence of any previous infections. They will also be used to check for low iron levels (anemia) and Rh antibodies. Later in the pregnancy, blood tests for diabetes will be done along with other tests if problems develop.  Urine tests to check for infections, diabetes, or protein in the urine.  An ultrasound to confirm the proper growth and development of the baby.  An amniocentesis to check for possible genetic problems.  Fetal  screens for spina bifida and Down syndrome.  You may need other tests to make sure you and the baby are doing well. HOME CARE INSTRUCTIONS  Medicines  Follow your health care provider's instructions regarding medicine use. Specific medicines may be either safe or unsafe to take during pregnancy.  Take your prenatal vitamins as directed.  If you develop constipation, try taking a stool softener if your health care provider approves. Diet  Eat regular, well-balanced meals. Choose a variety of foods, such as meat or vegetable-based protein, fish, milk and low-fat dairy products, vegetables, fruits, and whole grain breads and cereals. Your health care provider will help you determine the amount of weight gain that is right for you.  Avoid raw meat and uncooked cheese. These carry germs that can cause birth defects in the baby.  Eating four or five small meals rather than three large meals a day may help relieve nausea and vomiting. If you start to feel nauseous, eating a few soda crackers can be helpful. Drinking liquids between meals instead of during meals also seems to help nausea and vomiting.  If you develop constipation, eat more high-fiber foods, such as fresh vegetables or fruit and whole grains. Drink enough fluids to keep your urine clear or pale yellow. Activity and Exercise  Exercise only as directed by your health care provider. Exercising will help you:  Control your weight.  Stay in shape.  Be prepared for labor and delivery.  Experiencing pain or cramping in the lower abdomen or low back is a good sign that you should stop exercising. Check with your health care provider before continuing normal exercises.  Try to avoid standing for long periods of time. Move your legs often if you must stand in one place for a long time.  Avoid heavy lifting.  Wear low-heeled shoes, and practice good posture.  You may continue to have sex unless your health care provider directs you  otherwise. Relief of Pain or Discomfort  Wear a good support bra for breast tenderness.    Take warm sitz baths to soothe any pain or discomfort caused by hemorrhoids. Use hemorrhoid cream  if your health care provider approves.    Rest with your legs elevated if you have leg cramps or low back pain.  If you develop varicose veins in your legs, wear support hose. Elevate your feet for 15 minutes, 3-4 times a day. Limit salt in your diet. Prenatal Care  Schedule your prenatal visits by the twelfth week of pregnancy. They are usually scheduled monthly at first, then more often in the last 2 months before delivery.  Write down your questions. Take them to your prenatal visits.  Keep all your prenatal visits as directed by your health care provider. Safety  Wear your seat belt at all times when driving.  Make a list of emergency phone numbers, including numbers for family, friends, the hospital, and police and fire departments. General Tips  Ask your health care provider for a referral to a local prenatal education class. Begin classes no later than at the beginning of month 6 of your pregnancy.  Ask for help if you have counseling or nutritional needs during pregnancy. Your health care provider can offer advice or refer you to specialists for help with various needs.  Do not use hot tubs, steam rooms, or saunas.  Do not douche or use tampons or scented sanitary pads.  Do not cross your legs for long periods of time.  Avoid cat litter boxes and soil used by cats. These carry germs that can cause birth defects in the baby and possibly loss of the fetus by miscarriage or stillbirth.  Avoid all smoking, herbs, alcohol, and medicines not prescribed by your health care provider. Chemicals in these affect the formation and growth of the baby.  Schedule a dentist appointment. At home, brush your teeth with a soft toothbrush and be gentle when you floss. SEEK MEDICAL CARE IF:   You have  dizziness.  You have mild pelvic cramps, pelvic pressure, or nagging pain in the abdominal area.  You have persistent nausea, vomiting, or diarrhea.  You have a bad smelling vaginal discharge.  You have pain with urination.  You notice increased swelling in your face, hands, legs, or ankles. SEEK IMMEDIATE MEDICAL CARE IF:   You have a fever.  You are leaking fluid from your vagina.  You have spotting or bleeding from your vagina.  You have severe abdominal cramping or pain.  You have rapid weight gain or loss.  You vomit blood or material that looks like coffee grounds.  You are exposed to [place] measles and have never had them.  You are exposed to fifth disease or chickenpox.  You develop a severe headache.  You have shortness of breath.  You have any kind of trauma, such as from a fall or a car accident. Document Released: 05/19/2001 Document Revised: 10/09/2013 Document Reviewed: 04/04/2013 [facility] Patient Information 2015 [place], [place]. This information is not intended to replace advice given to you by your health care provider. Make sure you discuss any questions you have with your health care provider.  Coronavirus (COVID-19) Are you at risk?  Are you at risk for the Coronavirus (COVID-19)?  To be considered HIGH RISK for Coronavirus (COVID-19), you have to meet the following criteria:  . Traveled to [place], [place], [place], [place] or [place]; or in the [place] to [place], [place], [place], or [place]; and have fever, cough, and shortness of breath within the last 2 weeks of travel OR . Been in close contact with a person diagnosed with COVID-19 within the last 2  weeks and have fever, cough, and shortness of breath . IF YOU DO NOT MEET THESE CRITERIA, YOU ARE CONSIDERED LOW RISK FOR COVID-19.  What to do if you are HIGH RISK for COVID-19?  Marland Kitchen If you are having a medical emergency, call 911. . Seek medical care right away. Before you go to a  doctor's office, urgent care or emergency department, call ahead and tell them about your recent travel, contact with someone diagnosed with COVID-19, and your symptoms. You should receive instructions from your physician's office regarding next steps of care.  . When you arrive at healthcare provider, tell the healthcare staff immediately you have returned from visiting [place], [place], [place], [place] or [place]; or traveled in the [place] to [place], [place], [place], or [place]; in the last two weeks or you have been in close contact with a person diagnosed with COVID-19 in the last 2 weeks.   . Tell the health care staff about your symptoms: fever, cough and shortness of breath. . After you have been seen by a medical provider, you will be either: o Tested for (COVID-19) and discharged home on quarantine except to seek medical care if symptoms worsen, and asked to  - Stay home and avoid contact with others until you get your results (4-5 days)  - Avoid travel on public transportation if possible (such as bus, train, or airplane) or o Sent to the Emergency Department by EMS for evaluation, COVID-19 testing, and possible admission depending on your condition and test results.  What to do if you are LOW RISK for COVID-19?  Reduce your risk of any infection by using the same precautions used for avoiding the common cold or flu:  Marland Kitchen Wash your hands often with soap and warm water for at least 20 seconds.  If soap and water are not readily available, use an alcohol-based hand sanitizer with at least 60% alcohol.  . If coughing or sneezing, cover your mouth and nose by coughing or sneezing into the elbow areas of your shirt or coat, into a tissue or into your sleeve (not your hands). . Avoid shaking hands with others and consider head nods or verbal greetings only. . Avoid touching your eyes, nose, or mouth with unwashed hands.  . Avoid close contact with people who are sick. . Avoid  places or events with large numbers of people in one location, like concerts or sporting events. . Carefully consider travel plans you have or are making. . If you are planning any travel outside or inside the [place], visit the CDC's Travelers' Health webpage for the latest health notices. . If you have some symptoms but not all symptoms, continue to monitor at home and seek medical attention if your symptoms worsen. . If you are having a medical emergency, call 911.   [place] / e-Visit: [email or web address]         [facility]: [facility]: [id number]                   [facility]: [phone]

## 2019-02-20 NOTE — Progress Notes (Signed)
[place] 12+6 wks,measurements c/w dates,crl 62.85 mm,NB present,NT 1.7 mm,normal left ovary,simple right corpus luteal cyst 5.2 x 5 x 3.4 cm,fhr 149 bpm

## 2019-02-20 NOTE — Progress Notes (Signed)
INITIAL OBSTETRICAL VISIT Patient name: Rebecca Gardner MRN [id number]  Date of birth: Mar 18, 1996 Chief Complaint:   Initial Prenatal Visit (nt/it)  History of Present Illness:   Rebecca Gardner is a 23 y.o. G30P0000 Caucasian female at [redacted]w[redacted]d by LMP c/w today's u/s, with an Estimated Date of Delivery: 08/29/19 being seen today for her initial obstetrical visit.   Her obstetrical history is significant for primigravida.   Dep/anx- zolof in past, none in about 2years, doing well Today she reports some nausea, no vomiting, declines meds.  Patient's last menstrual period was 11/22/2018 (approximate). Last pap Jan 2018 in [place]. Results were: normal Review of Systems:   Pertinent items are noted in HPI Denies cramping/contractions, leakage of fluid, vaginal bleeding, abnormal vaginal discharge w/ itching/odor/irritation, headaches, visual changes, shortness of breath, chest pain, abdominal pain, severe nausea/vomiting, or problems with urination or bowel movements unless otherwise stated above.  Pertinent History Reviewed:  Reviewed past medical,surgical, social, obstetrical and family history.  Reviewed problem list, medications and allergies. OB History  Gravida Para Term Preterm AB Living  1 0 0 0 0 0  SAB TAB Ectopic Multiple Live Births  0 0 0 0      # Outcome Date GA Lbr Len/2nd Weight Sex Delivery Anes PTL Lv  1 Current            Physical Assessment:   Vitals:   02/20/19 0949  BP: 127/78  Pulse: 91  Weight: (!) 329 lb (149.2 kg)  Body mass index is 50.02 kg/m.       Physical Examination:  General appearance - well appearing, and in no distress  Mental status - alert, oriented to person, place, and time  Psych:  She has a normal mood and affect  Skin - warm and dry, normal color, no suspicious lesions noted  Chest - effort normal, all lung fields clear to auscultation bilaterally  Heart - normal rate and regular rhythm  Abdomen - soft, nontender  Extremities:  No swelling  or varicosities noted  Thin prep pap is not done  TODAY'S NT [place] 12+6 wks,measurements c/w dates,crl 62.85 mm,NB present,NT 1.7 mm,normal left ovary,simple right corpus luteal cyst 5.2 x 5 x 3.4 cm,fhr 149 bpm,limited view because of pt body habitus   Results for orders placed or performed in visit on 02/20/19 (from the past 24 hour(s))  POC Urinalysis Dipstick OB   Collection Time: 02/20/19 10:02 AM  Result Value Ref Range   Color, UA     Clarity, UA     Glucose, UA Negative Negative   Bilirubin, UA     Ketones, UA large    Spec Grav, UA     Blood, UA trace    pH, UA     POC,PROTEIN,UA Negative Negative, Trace, Small (1+), Moderate (2+), Large (3+), 4+   Urobilinogen, UA     Nitrite, UA neg    Leukocytes, UA Negative Negative   Appearance     Odor      Assessment & Plan:  1) Low-Risk Pregnancy G1P0000 at [redacted]w[redacted]d with an Estimated Date of Delivery: 08/29/19   2) Initial OB visit  3) Dep/anx> no meds, doing well  4) Rt ovarian cyst> simple, CL, 5cm, reviewed torsion warning s/s   Meds: No orders of the defined types were placed in this encounter.   Initial labs obtained Continue prenatal vitamins Reviewed n/v relief measures and warning s/s to report Reviewed recommended weight gain based on pre-gravid BMI Encouraged well-balanced diet  Genetic Screening discussed: requested Cystic fibrosis, SMA, Fragile X screening discussed requested nt/it, declined maternit21 Ultrasound discussed; fetal survey: requested CCNC completed>doesn't have mcaid, discussed pregnancy mcaid, may go to DSS to apply Has home bp cuff. Check bp weekly, let us know if >140/90.   Follow-up: Return in about 6 weeks (around 04/03/2019) for LROB, 2nd IT, ZO:XWRUEAVS:Anatomy, in person.   Orders Placed This Encounter  Procedures  . Urine Culture  . GC/Chlamydia Probe Amp  . US OB Comp + 14 Wk  . Integrated 1  . Obstetric Panel, Including HIV  . Inheritest Core(CF97,SMA,FraX)  . POC Urinalysis Dipstick OB     Cheral MarkerKimberly R Syerra Abdelrahman CNM, [facility]NP-BC 02/20/2019 10:18 AM

## 2019-02-22 LAB — GC/CHLAMYDIA PROBE AMP
Chlamydia trachomatis, NAA: NEGATIVE
Neisseria Gonorrhoeae by PCR: NEGATIVE

## 2019-02-22 LAB — URINE CULTURE

## 2019-03-04 LAB — INHERITEST CORE(CF97,SMA,FRAX)

## 2019-03-04 LAB — OBSTETRIC PANEL, INCLUDING HIV
Antibody Screen: NEGATIVE
Basophils Absolute: 0 10*3/uL (ref 0.0–0.2)
Basos: 0 %
EOS (ABSOLUTE): 0.1 10*3/uL (ref 0.0–0.4)
Eos: 1 %
HIV Screen 4th Generation wRfx: NONREACTIVE
Hematocrit: 41.5 % (ref 34.0–46.6)
Hemoglobin: 13.4 g/dL (ref 11.1–15.9)
Hepatitis B Surface Ag: NEGATIVE
Immature Grans (Abs): 0 10*3/uL (ref 0.0–0.1)
Immature Granulocytes: 0 %
Lymphocytes Absolute: 2.6 10*3/uL (ref 0.7–3.1)
Lymphs: 24 %
MCH: 26 pg — ABNORMAL LOW (ref 26.6–33.0)
MCHC: 32.3 g/dL (ref 31.5–35.7)
MCV: 80 fL (ref 79–97)
Monocytes Absolute: 0.5 10*3/uL (ref 0.1–0.9)
Monocytes: 5 %
Neutrophils Absolute: 7.4 10*3/uL — ABNORMAL HIGH (ref 1.4–7.0)
Neutrophils: 70 %
Platelets: 305 10*3/uL (ref 150–450)
RBC: 5.16 x10E6/uL (ref 3.77–5.28)
RDW: 13.6 % (ref 11.7–15.4)
RPR Ser Ql: NONREACTIVE
Rh Factor: POSITIVE
Rubella Antibodies, IGG: 5.42 index (ref >0.99)
WBC: 10.8 10*3/uL (ref 3.4–10.8)

## 2019-03-04 LAB — INTEGRATED 1
Crown Rump Length: 62.9 mm
Gest. Age on Collection Date: 12.6 weeks
Maternal Age at EDD: 24.2 yr
Nuchal Translucency (NT): 1.7 mm
Number of Fetuses: 1
PAPP-A Value: 288.8 ng/mL
Weight: 329 [lb_av]

## 2019-04-03 ENCOUNTER — Other Ambulatory Visit: Payer: Self-pay

## 2019-04-03 ENCOUNTER — Ambulatory Visit (INDEPENDENT_AMBULATORY_CARE_PROVIDER_SITE_OTHER): Payer: 59

## 2019-04-03 ENCOUNTER — Ambulatory Visit (INDEPENDENT_AMBULATORY_CARE_PROVIDER_SITE_OTHER): Payer: 59 | Admitting: Obstetrics & Gynecology

## 2019-04-03 ENCOUNTER — Encounter: Payer: Self-pay | Admitting: Obstetrics & Gynecology

## 2019-04-03 ENCOUNTER — Other Ambulatory Visit: Payer: Self-pay | Admitting: *Deleted

## 2019-04-03 VITALS — BP 142/90 | HR 100 | Wt 335.0 lb

## 2019-04-03 DIAGNOSIS — Z1379 Encounter for other screening for genetic and chromosomal anomalies: Secondary | ICD-10-CM

## 2019-04-03 DIAGNOSIS — Z3A18 18 weeks gestation of pregnancy: Secondary | ICD-10-CM

## 2019-04-03 DIAGNOSIS — Z3401 Encounter for supervision of normal first pregnancy, first trimester: Secondary | ICD-10-CM

## 2019-04-03 DIAGNOSIS — Z3402 Encounter for supervision of normal first pregnancy, second trimester: Secondary | ICD-10-CM

## 2019-04-03 DIAGNOSIS — Z363 Encounter for antenatal screening for malformations: Secondary | ICD-10-CM | POA: Diagnosis not present

## 2019-04-03 DIAGNOSIS — Z1389 Encounter for screening for other disorder: Secondary | ICD-10-CM

## 2019-04-03 DIAGNOSIS — Z331 Pregnant state, incidental: Secondary | ICD-10-CM

## 2019-04-03 LAB — POCT URINALYSIS DIPSTICK OB
Blood, UA: NEGATIVE
Glucose, UA: NEGATIVE
Ketones, UA: NEGATIVE
Leukocytes, UA: NEGATIVE
Nitrite, UA: NEGATIVE
POC,PROTEIN,UA: NEGATIVE

## 2019-04-03 MED ORDER — BLOOD PRESSURE MONITOR MISC: 0 refills | Status: DC

## 2019-04-03 NOTE — Progress Notes (Signed)
   LOW-RISK PREGNANCY VISIT Patient name: Rebecca Gardner MRN [id number]  Date of birth: 03-01-1996 Chief Complaint:   Routine Prenatal Visit ([place] 2nd IT)  History of Present Illness:   Rebecca Gardner is a 23 y.o. G69P0000 female at [redacted]w[redacted]d with an Estimated Date of Delivery: 08/29/19 being seen today for ongoing management of a low-risk pregnancy.  Today she reports no complaints. Contractions: Not present. Vag. Bleeding: None.  Movement: Absent. denies leaking of fluid. Review of Systems:   Pertinent items are noted in HPI Denies abnormal vaginal discharge w/ itching/odor/irritation, headaches, visual changes, shortness of breath, chest pain, abdominal pain, severe nausea/vomiting, or problems with urination or bowel movements unless otherwise stated above. Pertinent History Reviewed:  Reviewed past medical,surgical, social, obstetrical and family history.  Reviewed problem list, medications and allergies. Physical Assessment:   Vitals:   04/03/19 0952  BP: (!) 142/90  Pulse: 100  Weight: (!) 335 lb (152 kg)  Body mass index is 50.94 kg/m.        Physical Examination:   General appearance: Well appearing, and in no distress  Mental status: Alert, oriented to person, place, and time  Skin: Warm & dry  Cardiovascular: Normal heart rate noted  Respiratory: Normal respiratory effort, no distress  Abdomen: Soft, gravid, nontender  Pelvic: Cervical exam deferred         Extremities: Edema: None  Fetal Status: Fetal Heart Rate (bpm): 157   Movement: Absent    Chaperone: n/a    Results for orders placed or performed in visit on 04/03/19 (from the past 24 hour(s))  POC Urinalysis Dipstick OB   Collection Time: 04/03/19  9:57 AM  Result Value Ref Range   Color, UA     Clarity, UA     Glucose, UA Negative Negative   Bilirubin, UA     Ketones, UA neg    Spec Grav, UA     Blood, UA neg    pH, UA     POC,PROTEIN,UA Negative Negative, Trace, Small (1+), Moderate (2+), Large (3+), 4+   Urobilinogen, UA     Nitrite, UA neg    Leukocytes, UA Negative Negative   Appearance     Odor      Assessment & Plan:  1) Low-risk pregnancy G1P0000 at [redacted]w[redacted]d with an Estimated Date of Delivery: 08/29/19   2) Borderline HTN, my guess is that pt will become definitively diagnosed with CHTN as the pregnancy progresses   Meds: No orders of the defined types were placed in this encounter.  Labs/procedures today: sonogram + PN2  Plan:  Continue routine obstetrical care  Next visit: prefers in person    Reviewed:  labor symptoms and general obstetric precautions including but not limited to vaginal bleeding, contractions, leaking of fluid and fetal movement were reviewed in detail with the patient.  All questions were answered. Prescribed home bp cuff. . Check 4x daily and record, let us know if >155/105   Follow-up: Return in about 1 week (around 04/10/2019) for BP check with nurse, 4 weeks sonogram + OB visit.  Orders Placed This Encounter  Procedures  . US OB Follow Up  . INTEGRATED 2  . POC Urinalysis Dipstick OB   Mertie Clause Rayjon Wery  04/03/2019 10:13 AM

## 2019-04-03 NOTE — Progress Notes (Signed)
[place] 47+2 wks,cephalic,anterior placenta gr 0,cx 3.8 cm,svp of fluid 4.4 cm,normal left ovary,simple right ov cyst 3.9 x 3.4 x 2.7 cm,fhr 157 bpm,EFW 240 g 23%,limited ultrasound because of pt body habitus,please have pt come back for additional images of heart and face,no obvious abnormalities

## 2019-04-05 LAB — INTEGRATED 2
AFP MoM: 1.37
Alpha-Fetoprotein: 28 ng/mL
Crown Rump Length: 62.9 mm
DIA MoM: 1.03
DIA Value: 114.9 pg/mL
Estriol, Unconjugated: 1.44 ng/mL
Gest. Age on Collection Date: 12.6 weeks
Gestational Age: 18.6 weeks
Maternal Age at EDD: 24.2 yr
Nuchal Translucency (NT): 1.7 mm
Nuchal Translucency MoM: 1.21
Number of Fetuses: 1
PAPP-A MoM: 0.74
PAPP-A Value: 288.8 ng/mL
Test Results:: NEGATIVE
Weight: 329 [lb_av]
Weight: 329 [lb_av]
hCG MoM: 0.78
hCG Value: 9.3 IU/mL
uE3 MoM: 1.17

## 2019-04-13 ENCOUNTER — Ambulatory Visit: Payer: 59

## 2019-04-13 ENCOUNTER — Other Ambulatory Visit: Payer: Self-pay

## 2019-04-13 VITALS — BP 127/87 | HR 105 | Ht 68.0 in | Wt 333.0 lb

## 2019-04-13 DIAGNOSIS — Z331 Pregnant state, incidental: Secondary | ICD-10-CM

## 2019-04-13 DIAGNOSIS — Z1389 Encounter for screening for other disorder: Secondary | ICD-10-CM

## 2019-04-13 DIAGNOSIS — Z0130 Encounter for examination of blood pressure without abnormal findings: Secondary | ICD-10-CM | POA: Insufficient documentation

## 2019-04-13 LAB — POCT URINALYSIS DIPSTICK OB
Blood, UA: NEGATIVE
Glucose, UA: NEGATIVE
Leukocytes, UA: NEGATIVE
Nitrite, UA: NEGATIVE
POC,PROTEIN,UA: NEGATIVE

## 2019-04-13 NOTE — Progress Notes (Addendum)
   NURSE VISIT- BLOOD PRESSURE CHECK . Rebecca Gardner is a 23 y.o. G50P0000 female here for blood pressure check BP 127/87   Pulse (!) 105   Ht 5\' 8"  (1.727 m)   Wt (!) 333 lb (151 kg)   LMP 11/22/2018 (Approximate)   BMI 50.63 kg/m   Appearance alert, well appearing, and in no distress.  ASSESSMENT: Pregnancy [redacted]w[redacted]d  blood pressure check  PLAN: Discussed with Nigel Berthold, CNM   Recommendations: no changes needed   Follow-up: as scheduled   Ladonna Snide  04/13/2019 8:51 AM   Attestation of Attending Supervision of Advanced Practitioner (CNM/NP/PA): Evaluation and management procedures were performed by the Advanced Practitioner under my supervision and collaboration. I have reviewed the Advanced Practitioner's note and chart, and I agree with the management and plan.  Jacelyn Grip MD Attending Physician for the Center for [facility] 04/13/2019 1:26 PM

## 2019-04-28 ENCOUNTER — Other Ambulatory Visit: Payer: Self-pay | Admitting: Obstetrics & Gynecology

## 2019-04-28 ENCOUNTER — Other Ambulatory Visit: Payer: Self-pay

## 2019-04-28 ENCOUNTER — Emergency Department (HOSPITAL_COMMUNITY)
Admission: EM | Admit: 2019-04-28 | Discharge: 2019-04-28 | Disposition: A | Discharge status: Home / Self Care | Payer: 59 | Attending: Emergency Medicine | Admitting: Emergency Medicine

## 2019-04-28 DIAGNOSIS — O99891 Other specified diseases and conditions complicating pregnancy: Secondary | ICD-10-CM | POA: Insufficient documentation

## 2019-04-28 DIAGNOSIS — Z79899 Other long term (current) drug therapy: Secondary | ICD-10-CM | POA: Diagnosis not present

## 2019-04-28 DIAGNOSIS — Z3401 Encounter for supervision of normal first pregnancy, first trimester: Secondary | ICD-10-CM

## 2019-04-28 DIAGNOSIS — R000 Tachycardia, unspecified: Secondary | ICD-10-CM | POA: Diagnosis not present

## 2019-04-28 DIAGNOSIS — R0602 Shortness of breath: Secondary | ICD-10-CM | POA: Insufficient documentation

## 2019-04-28 DIAGNOSIS — IMO0002 Reserved for concepts with insufficient information to code with codable children: Secondary | ICD-10-CM

## 2019-04-28 DIAGNOSIS — Z0489 Encounter for examination and observation for other specified reasons: Secondary | ICD-10-CM

## 2019-04-28 DIAGNOSIS — Z3A22 22 weeks gestation of pregnancy: Secondary | ICD-10-CM | POA: Insufficient documentation

## 2019-04-28 LAB — URINALYSIS, ROUTINE W REFLEX MICROSCOPIC
Bilirubin Urine: NEGATIVE
Glucose, UA: NEGATIVE mg/dL
Ketones, ur: NEGATIVE mg/dL
Leukocytes,Ua: NEGATIVE
Nitrite: NEGATIVE
Protein, ur: NEGATIVE mg/dL
Specific Gravity, Urine: 1.023 (ref 1.005–1.030)
pH: 5 (ref 5.0–8.0)

## 2019-04-28 LAB — BASIC METABOLIC PANEL
Anion gap: 10 (ref 5–15)
BUN: 7 mg/dL (ref 6–20)
CO2: 19 mmol/L — ABNORMAL LOW (ref 22–32)
Calcium: 9.4 mg/dL (ref 8.9–10.3)
Chloride: 106 mmol/L (ref 98–111)
Creatinine, Ser: 0.58 mg/dL (ref 0.44–1.00)
GFR calc Af Amer: >60 mL/min (ref >60)
GFR calc non Af Amer: >60 mL/min (ref >60)
Glucose, Bld: 96 mg/dL (ref 70–99)
Potassium: 3.6 mmol/L (ref 3.5–5.1)
Sodium: 135 mmol/L (ref 135–145)

## 2019-04-28 LAB — HEPATIC FUNCTION PANEL
ALT: 17 U/L (ref 0–44)
AST: 14 U/L — ABNORMAL LOW (ref 15–41)
Albumin: 3.2 g/dL — ABNORMAL LOW (ref 3.5–5.0)
Alkaline Phosphatase: 53 U/L (ref 38–126)
Bilirubin, Direct: <0.1 mg/dL (ref 0.0–0.2)
Total Bilirubin: <0.1 mg/dL — ABNORMAL LOW (ref 0.3–1.2)
Total Protein: 6.7 g/dL (ref 6.5–8.1)

## 2019-04-28 LAB — CBC
HCT: 36.2 % (ref 36.0–46.0)
Hemoglobin: 11.9 g/dL — ABNORMAL LOW (ref 12.0–15.0)
MCH: 27.2 pg (ref 26.0–34.0)
MCHC: 32.9 g/dL (ref 30.0–36.0)
MCV: 82.8 fL (ref 80.0–100.0)
Platelets: 276 10*3/uL (ref 150–400)
RBC: 4.37 MIL/uL (ref 3.87–5.11)
RDW: 13.6 % (ref 11.5–15.5)
WBC: 13.8 10*3/uL — ABNORMAL HIGH (ref 4.0–10.5)
nRBC: 0 % (ref 0.0–0.2)

## 2019-04-28 LAB — D-DIMER, QUANTITATIVE: D-Dimer, Quant: 0.44 ug/mL-FEU (ref 0.00–0.50)

## 2019-04-28 MED ORDER — SODIUM CHLORIDE 0.9 % IV BOLUS
500.0000 mL | Freq: Once | INTRAVENOUS | Status: AC
  Administered 2019-04-28: 500 mL via INTRAVENOUS

## 2019-04-28 NOTE — Progress Notes (Signed)
Dr Elonda Husky notified that pt is in ED with complaints of SOB and abdominal pain.  Pt is G1 P0 at 22w 3d pregnant. MD requests that ED doppler fht and to work her up as normal. Di Kindle, PA instructed to call back if patient has any vaginal bleeding or leaking of fluid.

## 2019-04-28 NOTE — Discharge Instructions (Addendum)
Please follow-up with your OB/GYN at your schedule appointment on November 30.  If you experience any abdominal pain, vaginal discharge, vaginal bleeding, worsening symptoms please go to [facility].

## 2019-04-28 NOTE — ED Notes (Signed)
Patient verbalizes understanding of discharge instructions. Opportunity for questioning and answers were provided. Armband removed by staff, pt discharged from ED.  

## 2019-04-28 NOTE — ED Provider Notes (Signed)
[facility] EMERGENCY DEPARTMENT Provider Note   CSN: [id number] Arrival date & time: 04/28/19  1612     History   Chief Complaint Chief Complaint  Patient presents with  . Shortness of Breath    HPI Rebecca Gardner is a 23 y.o. female.     23 y.o female G1P0 currently [redacted] weeks pregnant with no pertinent PMH presents to the ED with a chief complaint of shortness of breath x today. Patient is currently employed as a Buyer, retail at [employer], reports she was at work today when she all of a sudden became very short of breath, had some dizziness to her along with experiencing palpitations.  Patient reports measuring her heart rate which was remarkable for a rate of 150.  She reports the episode subsided after sitting down.  She was advised by her supervisor to be seen in the emergency department.  Patient endorses a nonproductive cough for the past couple days.  She reports no COVID-19 exposures, although she currently is employed at the hospital.  She does report a prior history of A. fib, was never placed on any anticoagulation medication.  She endorses some mild abdominal pain, mostly focal to the right upper quadrant, she has had some nausea along with vomiting, was attributing this to pregnancy.  She denies any fever, vaginal bleeding, vaginal discharge, urinary symptoms, chest pain.  The history is provided by the patient and medical records.  Shortness of Breath Associated symptoms: abdominal pain, cough and vomiting   Associated symptoms: no chest pain, no fever, no neck pain and no sore throat     Past Medical History:  Diagnosis Date  . Acute lateral meniscus tear of left knee 02/02/2014  . Acute medial meniscus tear of left knee   . Concussion with brief (less than one hour) loss of consciousness 4-28 15  . Depression     Patient Active Problem List   Diagnosis Date Noted  . Blood pressure check 04/13/2019  . Supervision of normal first  pregnancy 02/20/2019  . Right ovarian cyst 02/20/2019  . Gall stones 03/27/2016  . 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 Depression with anxiety 02/05/2016  . Cephalalgia 02/05/2016  . Obesity 07/30/2015  . Acute lateral meniscus tear of left knee 02/02/2014    Past Surgical History:  Procedure Laterality Date  . CHOLECYSTECTOMY N/A 04/10/2016   Procedure: LAPAROSCOPIC CHOLECYSTECTOMY;  Surgeon: Rodman Pickle, MD;  Location: [facility] OR;  Service: General;  Laterality: N/A;  . KNEE ARTHROSCOPY WITH LATERAL MENISECTOMY Left 02/02/2014   Procedure: LEFT KNEE ARTHROSCOPY WITH LATERAL MENISECTOMY;  Surgeon: Eulas Post, MD;  Location: [facility];  Service: Orthopedics;  Laterality: Left;  . TONSILLECTOMY       OB History    Gravida  1   Para  0   Term  0   Preterm  0   AB  0   Living  0     SAB  0   TAB  0   Ectopic  0   Multiple  0   Live Births               Home Medications    Prior to Admission medications   Medication Sig Start Date End Date Taking? Authorizing Provider  Blood Pressure Monitor MISC For regular home bp monitoring during pregnancy 04/03/19   Lazaro Arms, MD  Prenatal Vit-DSS-Fe Cbn-FA (PRENATAL AD PO) Take by mouth.    [provider]  Family History Family History  Problem Relation Age of Onset  . Breast cancer Paternal Grandmother   . Healthy Mother   . Atrial fibrillation Father   . Prostate cancer Paternal Grandfather   . Cancer Paternal Grandfather        liver  . Migraines Maternal Uncle   . COPD Maternal Uncle     Social History Social History   Tobacco Use  . Smoking status: Never Smoker  . Smokeless tobacco: Never Used  Substance Use Topics  . Alcohol use: No  . Drug use: No     Allergies   Patient has no known allergies.   Review of Systems Review of Systems  Constitutional: Negative for fever.  HENT: Negative for sore throat.   Respiratory: Positive for cough and shortness of  breath. Negative for chest tightness.   Cardiovascular: Positive for palpitations. Negative for chest pain and leg swelling.  Gastrointestinal: Positive for abdominal pain, nausea and vomiting.  Genitourinary: Negative for difficulty urinating, dysuria, flank pain, hematuria, urgency and vaginal bleeding.  Musculoskeletal: Negative for back pain and neck pain.  Skin: Negative for pallor and wound.  Neurological: Positive for dizziness.     Physical Exam Updated Vital Signs BP 128/74   Pulse 99   Temp 98.4 F (36.9 C) (Oral)   Resp (!) 23   LMP 11/22/2018 (Approximate)   SpO2 100%   Physical Exam Vitals signs and nursing note reviewed.  Constitutional:      Appearance: She is well-developed. She is obese. She is not ill-appearing or toxic-appearing.  HENT:     Head: Normocephalic and atraumatic.  Neck:     Musculoskeletal: Normal range of motion and neck supple.  Cardiovascular:     Rate and Rhythm: Tachycardia present.  Pulmonary:     Effort: Pulmonary effort is normal. No tachypnea or bradypnea.     Breath sounds: Normal breath sounds. No decreased breath sounds, wheezing or rhonchi.     Comments: Lungs are difficult to auscultate due to body habitus however without any wheezing or rales. Chest:     Chest wall: No tenderness.  Abdominal:     General: Bowel sounds are normal.     Palpations: Abdomen is soft.     Tenderness: There is abdominal tenderness in the right upper quadrant and epigastric area. There is no right CVA tenderness, left CVA tenderness or guarding. Negative signs include Murphy's sign.     Comments: Mild tenderness to palpation along right upper quadrant.  Bowel sounds are clear to auscultation.  Musculoskeletal: Normal range of motion.     Right lower leg: She exhibits no tenderness. No edema.     Left lower leg: She exhibits no tenderness. No edema.  Skin:    General: Skin is warm and dry.     Findings: No ecchymosis or erythema.  Neurological:      Mental Status: She is alert and oriented to person, place, and time.      ED Treatments / Results  Labs (all labs ordered are listed, but only abnormal results are displayed) Labs Reviewed  CBC - Abnormal; Notable for the following components:      Result Value   WBC 13.8 (*)    Hemoglobin 11.9 (*)    All other components within normal limits  BASIC METABOLIC PANEL - Abnormal; Notable for the following components:   CO2 19 (*)    All other components within normal limits  URINALYSIS, ROUTINE W REFLEX MICROSCOPIC - Abnormal; Notable for  the following components:   APPearance HAZY (*)    Hgb urine dipstick MODERATE (*)    Bacteria, UA RARE (*)    All other components within normal limits  HEPATIC FUNCTION PANEL - Abnormal; Notable for the following components:   Albumin 3.2 (*)    AST 14 (*)    Total Bilirubin <0.1 (*)    All other components within normal limits  D-DIMER, QUANTITATIVE (NOT AT [facility])    EKG EKG Interpretation  Date/Time:  Friday April 28 2019 16:30:42 EST Ventricular Rate:  124 PR Interval:  126 QRS Duration: 78 QT Interval:  318 QTC Calculation: 456 R Axis:   62 Text Interpretation: Sinus tachycardia Otherwise normal ECG No STEMI Confirmed by Octaviano Glow [phone]) on 04/28/2019 4:51:24 PM   Radiology No results found.  Procedures Procedures (including critical care time)  Medications Ordered in ED Medications  sodium chloride 0.9 % bolus 500 mL (0 mLs Intravenous Stopped 04/28/19 1941)     Initial Impression / Assessment and Plan / ED Course  I have reviewed the triage vital signs and the nursing notes.  Pertinent labs & imaging results that were available during my care of the patient were reviewed by me and considered in my medical decision making (see chart for details).    Patient with a past medical history of A. fib presents to the ED with complaints of shortness of breath, had a sudden episode while at work this evening, reports  she became dizzy, felt palpitations.  Reports episode improved.  Days recording her heart rate at work which was approximately 150.  She has never been on anticoagulation medication.  Patient is currently [redacted] weeks pregnant, last had an OB appointment several weeks ago in order to check her blood pressure which was within normal limits. He has not had any fevers, urinary symptoms, does have a mild nonproductive cough, no other sources of infection.  She arrived in the ED hypertensive, tachycardic, reports her blood pressure is usually elevated however this is the highest has ever been.  She also endorses some right upper quadrant abdominal pain, this seems to be mild in nature, she has not taken any medication for this.  Does not have any swelling to bilateral legs, no pitting edema, lower suspicion for any heart failure.  No calf tenderness, no swelling or obvious DVT urology.  Some suspicion for pulmonary embolism versus infection.  Basic lab work along with D-dimer level will be obtained prior to further managing patient.  She is otherwise hemodynamically stable.  5:54 PM spoke to OB rapid response team Erin who reported they were unable to monitor baby at this time.  They will call on-call OB in order to obtain further recommendations.  Obtain recommendations from [facility] response team who reported Dr. Call recommended Doppler versus monitoring.  Obtain bedside Doppler ultrasound with fetal heart tones in the 120 range.  D-dimer is negative, suspect PE is less likely on patient.  Tachycardia has resolved, has a resting heart rate in the 90s, according to her previous OB visits she usually runs around 90-100.  Blood pressure now normotensive, lower suspicion for any preeclampsia.  She is not having any vaginal bleeding, vaginal discharge, urinary symptoms.  BMP is unremarkable.  BC with some mild leukocytosis.  UA is remarkable for some moderate hemoglobin, rare bacteria.  7:30 PM contacted [facility]  OB/GYN nurse on call, who reported Dr. Frederich Balding ED is comfortable with patient's fetal heart rate in the 120 range.  Discussed  that no need to go to [facility]U per the recommendations.  I have discussed this with patient at length, she reports her abdominal discomfort is about a 4 out of 10, she does not feel like there are any changes in babies movement, no bleeding, discharge, feels her symptoms have resolved after fluids.  She does have an OB appointment scheduled for the 30th of the month, she is encouraged to follow-up with her GYN or return to [facility]U if symptoms worsen.  Patient understands and agrees with management, she is agreeable of plan.  Return precautions discussed at length.   I have discussed patient with Dr. Amado CoeFein, who was also seen patient and performed a bedside ultrasound.  He agrees with plan and management at this time.    Portions of this note were generated with Scientist, clinical (histocompatibility and immunogenetics)Dragon dictation software. Dictation errors may occur despite best attempts at proofreading.  Final Clinical Impressions(s) / ED Diagnoses   Final diagnoses:  SOB (shortness of breath)  Tachycardia    ED Discharge Orders    None       Claude MangesSoto, Zymeir Salminen, PA-C 04/28/19 1952    Terald Sleeperrifan, Matthew J, MD 04/28/19 2226

## 2019-05-08 ENCOUNTER — Encounter: Payer: Self-pay | Admitting: Obstetrics & Gynecology

## 2019-05-08 ENCOUNTER — Ambulatory Visit (INDEPENDENT_AMBULATORY_CARE_PROVIDER_SITE_OTHER): Payer: 59

## 2019-05-08 ENCOUNTER — Other Ambulatory Visit: Payer: Self-pay

## 2019-05-08 ENCOUNTER — Ambulatory Visit (INDEPENDENT_AMBULATORY_CARE_PROVIDER_SITE_OTHER): Payer: 59 | Admitting: Obstetrics & Gynecology

## 2019-05-08 ENCOUNTER — Encounter: Payer: 59 | Admitting: Obstetrics & Gynecology

## 2019-05-08 VITALS — BP 130/81 | HR 123 | Wt 330.0 lb

## 2019-05-08 DIAGNOSIS — Z3A23 23 weeks gestation of pregnancy: Secondary | ICD-10-CM | POA: Diagnosis not present

## 2019-05-08 DIAGNOSIS — Z362 Encounter for other antenatal screening follow-up: Secondary | ICD-10-CM

## 2019-05-08 DIAGNOSIS — Z0489 Encounter for examination and observation for other specified reasons: Secondary | ICD-10-CM

## 2019-05-08 DIAGNOSIS — Z3401 Encounter for supervision of normal first pregnancy, first trimester: Secondary | ICD-10-CM

## 2019-05-08 DIAGNOSIS — Z331 Pregnant state, incidental: Secondary | ICD-10-CM

## 2019-05-08 DIAGNOSIS — Z3402 Encounter for supervision of normal first pregnancy, second trimester: Secondary | ICD-10-CM

## 2019-05-08 DIAGNOSIS — Z1389 Encounter for screening for other disorder: Secondary | ICD-10-CM

## 2019-05-08 DIAGNOSIS — IMO0002 Reserved for concepts with insufficient information to code with codable children: Secondary | ICD-10-CM

## 2019-05-08 LAB — POCT URINALYSIS DIPSTICK OB
Blood, UA: NEGATIVE
Glucose, UA: NEGATIVE
Ketones, UA: NEGATIVE
Leukocytes, UA: NEGATIVE
Nitrite, UA: NEGATIVE
POC,PROTEIN,UA: NEGATIVE

## 2019-05-08 NOTE — Progress Notes (Signed)
[place] 23+6 wks,breech,cx 3.5 cm,anterior placenta gr 9,fhr 140 bpm,svp of fluid 7 cm,simple right ovarian cyst 2.9 x 2.8 x 2.5 cm,left ovary not visualized,efw 591 g 23%,limited view because of pt body habitus and fetal position,need additional images of OFT,no obvious abnormalities

## 2019-05-08 NOTE — Progress Notes (Signed)
   LOW-RISK PREGNANCY VISIT Patient name: Rebecca Gardner MRN [id number]  Date of birth: [birth year] Chief Complaint:   Routine Prenatal Visit (U/S)  History of Present Illness:   Rebecca Gardner is a 23 y.o. G6P0000 female at [redacted]w[redacted]d with an Estimated Date of Delivery: 08/29/19 being seen today for ongoing management of a low-risk pregnancy.  Today she reports no complaints. Contractions: Not present.  .  Movement: Present. denies leaking of fluid. Review of Systems:   Pertinent items are noted in HPI Denies abnormal vaginal discharge w/ itching/odor/irritation, headaches, visual changes, shortness of breath, chest pain, abdominal pain, severe nausea/vomiting, or problems with urination or bowel movements unless otherwise stated above. Pertinent History Reviewed:  Reviewed past medical,surgical, social, obstetrical and family history.  Reviewed problem list, medications and allergies. Physical Assessment:   Vitals:   05/08/19 1223  BP: 130/81  Pulse: (!) 123  Weight: (!) 330 lb (149.7 kg)  Body mass index is 50.18 kg/m.        Physical Examination:   General appearance: Well appearing, and in no distress  Mental status: Alert, oriented to person, place, and time  Skin: Warm & dry  Cardiovascular: Normal heart rate noted  Respiratory: Normal respiratory effort, no distress  Abdomen: Soft, gravid, nontender  Pelvic: Cervical exam deferred         Extremities: Edema: None  Fetal Status:     Movement: Present    Chaperone: n/a    Results for orders placed or performed in visit on 05/08/19 (from the past 24 hour(s))  POC Urinalysis Dipstick OB   Collection Time: 05/08/19 12:32 PM  Result Value Ref Range   Color, UA     Clarity, UA     Glucose, UA Negative Negative   Bilirubin, UA     Ketones, UA neg    Spec Grav, UA     Blood, UA neg    pH, UA     POC,PROTEIN,UA Negative Negative, Trace, Small (1+), Moderate (2+), Large (3+), 4+   Urobilinogen, UA     Nitrite, UA neg    Leukocytes, UA Negative Negative   Appearance     Odor      Assessment & Plan:  1) Low-risk pregnancy G1P0000 at [redacted]w[redacted]d with an Estimated Date of Delivery: 08/29/19   2)    Meds: No orders of the defined types were placed in this encounter.  Labs/procedures today: sonogram  Plan:  Continue routine obstetrical care  Next visit: prefers     Reviewed:  labor symptoms and general obstetric precautions including but not limited to vaginal bleeding, contractions, leaking of fluid and fetal movement were reviewed in detail with the patient.  All questions were answered.  home bp cuff. Rx faxed to . Check bp weekly, let us know if >140/90.   Follow-up: No follow-ups on file.  Orders Placed This Encounter  Procedures  . POC Urinalysis Dipstick OB   Florian Buff  05/08/2019 12:41 PM

## 2019-05-19 ENCOUNTER — Other Ambulatory Visit: Payer: Self-pay | Admitting: Obstetrics & Gynecology

## 2019-05-19 DIAGNOSIS — Z0489 Encounter for examination and observation for other specified reasons: Secondary | ICD-10-CM

## 2019-05-19 DIAGNOSIS — IMO0002 Reserved for concepts with insufficient information to code with codable children: Secondary | ICD-10-CM

## 2019-05-22 ENCOUNTER — Encounter: Payer: Self-pay | Admitting: Advanced Practice Midwife

## 2019-05-22 ENCOUNTER — Other Ambulatory Visit: Payer: Self-pay

## 2019-05-22 ENCOUNTER — Ambulatory Visit (INDEPENDENT_AMBULATORY_CARE_PROVIDER_SITE_OTHER): Payer: 59 | Admitting: Advanced Practice Midwife

## 2019-05-22 ENCOUNTER — Ambulatory Visit (INDEPENDENT_AMBULATORY_CARE_PROVIDER_SITE_OTHER): Payer: 59

## 2019-05-22 VITALS — BP 156/96 | HR 116 | Wt 333.0 lb

## 2019-05-22 DIAGNOSIS — O0992 Supervision of high risk pregnancy, unspecified, second trimester: Secondary | ICD-10-CM

## 2019-05-22 DIAGNOSIS — Z3A25 25 weeks gestation of pregnancy: Secondary | ICD-10-CM

## 2019-05-22 DIAGNOSIS — Z0489 Encounter for examination and observation for other specified reasons: Secondary | ICD-10-CM

## 2019-05-22 DIAGNOSIS — I10 Essential (primary) hypertension: Secondary | ICD-10-CM | POA: Diagnosis not present

## 2019-05-22 DIAGNOSIS — Z1389 Encounter for screening for other disorder: Secondary | ICD-10-CM

## 2019-05-22 DIAGNOSIS — Z362 Encounter for other antenatal screening follow-up: Secondary | ICD-10-CM

## 2019-05-22 DIAGNOSIS — IMO0002 Reserved for concepts with insufficient information to code with codable children: Secondary | ICD-10-CM

## 2019-05-22 DIAGNOSIS — Z331 Pregnant state, incidental: Secondary | ICD-10-CM

## 2019-05-22 LAB — POCT URINALYSIS DIPSTICK OB
Glucose, UA: NEGATIVE
Ketones, UA: NEGATIVE
Nitrite, UA: NEGATIVE
POC,PROTEIN,UA: NEGATIVE

## 2019-05-22 MED ORDER — ASPIRIN EC 81 MG PO TBEC: 162.0000 mg | DELAYED_RELEASE_TABLET | Freq: Every day | ORAL | 6 refills | Status: DC

## 2019-05-22 MED ORDER — LABETALOL HCL 200 MG PO TABS: 200.0000 mg | ORAL_TABLET | Freq: Two times a day (BID) | ORAL | 3 refills | Status: DC

## 2019-05-22 NOTE — Patient Instructions (Signed)

## 2019-05-22 NOTE — Progress Notes (Signed)
[place] 35+6 wks,fhr 142 bpm,anterior placenta gr 0,svp of fluid 6.3 cm,cx 3.8 cm,efw 797 g 21%,anatomy complete,no obvious abnormalities,limited view

## 2019-05-22 NOTE — Progress Notes (Signed)
LOW-RISK PREGNANCY VISIT Patient name: Rebecca Gardner MRN [id number]  Date of birth: 12/05/1995 Chief Complaint:   Routine Prenatal Visit ([place] today)  History of Present Illness:   Rebecca Gardner is a 23 y.o. G51P0000 female at [redacted]w[redacted]d with an Estimated Date of Delivery: 08/29/19 being seen today for ongoing management of a low-risk pregnancy.  Today she reports no complaints. Contractions: Not present. Vag. Bleeding: None.  Movement: Present. denies leaking of fluid. Review of Systems:   Pertinent items are noted in HPI Denies abnormal vaginal discharge w/ itching/odor/irritation, headaches, visual changes, shortness of breath, chest pain, abdominal pain, severe nausea/vomiting, or problems with urination or bowel movements unless otherwise stated above.  Pertinent History Reviewed:  Medical & Surgical Hx:   Past Medical History:  Diagnosis Date  . Acute lateral meniscus tear of left knee 02/02/2014  . Acute medial meniscus tear of left knee   . Concussion with brief (less than one hour) loss of consciousness 4-28 15  . Depression    Past Surgical History:  Procedure Laterality Date  . CHOLECYSTECTOMY N/A 04/10/2016   Procedure: LAPAROSCOPIC CHOLECYSTECTOMY;  Surgeon: Rodman Pickle, MD;  Location: [facility] OR;  Service: General;  Laterality: N/A;  . KNEE ARTHROSCOPY WITH LATERAL MENISECTOMY Left 02/02/2014   Procedure: LEFT KNEE ARTHROSCOPY WITH LATERAL MENISECTOMY;  Surgeon: Eulas Post, MD;  Location: [facility];  Service: Orthopedics;  Laterality: Left;  . TONSILLECTOMY     Family History  Problem Relation Age of Onset  . Breast cancer Paternal Grandmother   . Healthy Mother   . Atrial fibrillation Father   . Prostate cancer Paternal Grandfather   . Cancer Paternal Grandfather        liver  . Migraines Maternal Uncle   . COPD Maternal Uncle     Current Outpatient Medications:  .  Prenatal Vit-Fe Fumarate-FA (PRENATAL VITAMIN PO), Take by mouth daily.,  Disp: , Rfl:  .  aspirin EC 81 MG tablet, Take 2 tablets (162 mg total) by mouth daily., Disp: 60 tablet, Rfl: 6 .  labetalol (NORMODYNE) 200 MG tablet, Take 1 tablet (200 mg total) by mouth 2 (two) times daily., Disp: 60 tablet, Rfl: 3 Social History: Reviewed -  reports that she has never smoked. She has never used smokeless tobacco.  Physical Assessment:   Vitals:   05/22/19 1606 05/22/19 1609  BP: (!) 151/103 (!) 156/96  Pulse: (!) 106 (!) 116  Weight: (!) 333 lb (151 kg)   Body mass index is 50.63 kg/m.        Physical Examination:   General appearance: Well appearing, and in no distress  Mental status: Alert, oriented to person, place, and time  Skin: Warm & dry  Cardiovascular: Normal heart rate noted  Respiratory: Normal respiratory effort, no distress  Abdomen: Soft, gravid, nontender  Pelvic: Cervical exam deferred         Extremities: Edema: None  Fetal Status:     Movement: Present   [place] 35+6 wks,fhr 142 bpm,anterior placenta gr 0,svp of fluid 6.3 cm,cx 3.8 cm,efw 797 g 21%,anatomy complete,no obvious abnormalities,limited view  Results for orders placed or performed in visit on 05/22/19 (from the past 24 hour(s))  POC Urinalysis Dipstick OB   Collection Time: 05/22/19  4:00 PM  Result Value Ref Range   Color, UA     Clarity, UA     Glucose, UA Negative Negative   Bilirubin, UA     Ketones, UA neg  Spec Grav, UA     Blood, UA 1+    pH, UA     POC,PROTEIN,UA Negative Negative, Trace, Small (1+), Moderate (2+), Large (3+), 4+   Urobilinogen, UA     Nitrite, UA neg    Leukocytes, UA Moderate (2+) (A) Negative   Appearance     Odor      Assessment & Plan:  1) Low-risk pregnancy G1P0000 at [redacted]w[redacted]d with an Estimated Date of Delivery: 08/29/19   2) CHTN, dx today. , Review of BPs show elevated BPs as far back as 2017. Not had a dx of this before. Will start testing at 32 weeks.   Labs/procedures/US today: CMP/pr/cr ratio  Plan:  Continue routine obstetrical  care Start ASA 162mg  Start Labetalol 200mg  BID   Follow-up: Return in about 3 weeks (around 06/12/2019) for PN2/LROB  and Thursday w/RN for BP check.  Orders Placed This Encounter  Procedures  . Protein, urine, 24 hour  . Comprehensive metabolic panel  . POC Urinalysis Dipstick OB   Christin Fudge CNM 05/22/2019 4:42 PM

## 2019-05-23 ENCOUNTER — Encounter: Payer: Self-pay | Admitting: Advanced Practice Midwife

## 2019-05-23 LAB — COMPREHENSIVE METABOLIC PANEL
ALT: 13 IU/L (ref 0–32)
AST: 12 IU/L (ref 0–40)
Albumin/Globulin Ratio: 1.5 (ref 1.2–2.2)
Albumin: 3.7 g/dL — ABNORMAL LOW (ref 3.9–5.0)
Alkaline Phosphatase: 75 IU/L (ref 39–117)
BUN/Creatinine Ratio: 8 — ABNORMAL LOW (ref 9–23)
BUN: 4 mg/dL — ABNORMAL LOW (ref 6–20)
Bilirubin Total: <0.2 mg/dL (ref 0.0–1.2)
CO2: 18 mmol/L — ABNORMAL LOW (ref 20–29)
Calcium: 9.6 mg/dL (ref 8.7–10.2)
Chloride: 104 mmol/L (ref 96–106)
Creatinine, Ser: 0.5 mg/dL — ABNORMAL LOW (ref 0.57–1.00)
GFR calc Af Amer: 158 mL/min/{1.73_m2} (ref >59)
GFR calc non Af Amer: 137 mL/min/{1.73_m2} (ref >59)
Globulin, Total: 2.5 g/dL (ref 1.5–4.5)
Glucose: 97 mg/dL (ref 65–99)
Potassium: 3.9 mmol/L (ref 3.5–5.2)
Sodium: 137 mmol/L (ref 134–144)
Total Protein: 6.2 g/dL (ref 6.0–8.5)

## 2019-05-24 ENCOUNTER — Other Ambulatory Visit: Payer: Self-pay

## 2019-05-24 ENCOUNTER — Inpatient Hospital Stay (HOSPITAL_COMMUNITY)
Admission: AD | Admit: 2019-05-24 | Discharge: 2019-05-25 | Disposition: A | Discharge status: Home / Self Care | Payer: 59 | Attending: Family Medicine | Admitting: Family Medicine

## 2019-05-24 ENCOUNTER — Encounter (HOSPITAL_COMMUNITY): Payer: Self-pay | Admitting: Family Medicine

## 2019-05-24 DIAGNOSIS — M545 Low back pain, unspecified: Secondary | ICD-10-CM

## 2019-05-24 DIAGNOSIS — R58 Hemorrhage, not elsewhere classified: Secondary | ICD-10-CM | POA: Diagnosis not present

## 2019-05-24 DIAGNOSIS — Z79899 Other long term (current) drug therapy: Secondary | ICD-10-CM | POA: Diagnosis not present

## 2019-05-24 DIAGNOSIS — N200 Calculus of kidney: Secondary | ICD-10-CM

## 2019-05-24 DIAGNOSIS — O4692 Antepartum hemorrhage, unspecified, second trimester: Secondary | ICD-10-CM | POA: Diagnosis not present

## 2019-05-24 DIAGNOSIS — R319 Hematuria, unspecified: Secondary | ICD-10-CM | POA: Diagnosis not present

## 2019-05-24 DIAGNOSIS — Z7982 Long term (current) use of aspirin: Secondary | ICD-10-CM | POA: Diagnosis not present

## 2019-05-24 DIAGNOSIS — Z3A26 26 weeks gestation of pregnancy: Secondary | ICD-10-CM | POA: Insufficient documentation

## 2019-05-24 DIAGNOSIS — O26892 Other specified pregnancy related conditions, second trimester: Secondary | ICD-10-CM | POA: Diagnosis not present

## 2019-05-24 HISTORY — DX: Calculus of kidney: N20.0

## 2019-05-24 LAB — URINALYSIS, ROUTINE W REFLEX MICROSCOPIC
Bilirubin Urine: NEGATIVE
Glucose, UA: NEGATIVE mg/dL
Ketones, ur: 5 mg/dL — AB
Leukocytes,Ua: NEGATIVE
Nitrite: NEGATIVE
Protein, ur: NEGATIVE mg/dL
Specific Gravity, Urine: 1.02 (ref 1.005–1.030)
pH: 6 (ref 5.0–8.0)

## 2019-05-24 MED ORDER — TRAMADOL HCL 50 MG PO TABS
50.0000 mg | ORAL_TABLET | Freq: Once | ORAL | Status: AC
  Administered 2019-05-24: 50 mg via ORAL
  Filled 2019-05-24: qty 1

## 2019-05-24 MED ORDER — CYCLOBENZAPRINE HCL 10 MG PO TABS
10.0000 mg | ORAL_TABLET | Freq: Once | ORAL | Status: AC
  Administered 2019-05-24: 23:00:00 10 mg via ORAL
  Filled 2019-05-24: qty 1

## 2019-05-24 NOTE — MAU Provider Note (Signed)
Chief Complaint:  Back Pain and Vaginal Bleeding   First Provider Initiated Contact with Patient 05/24/19 2154     HPI: Rebecca Gardner is a 23 y.o. G1P0000 at 50w1dwho presents to maternity admissions reporting an episoded of period like bleeding tonight.  Some right lower back pain, but no contractions or tightening.  Had BM this am, thinks bleeding was vaginal. . She reports good fetal movement, denies LOF, vaginal itching/burning, urinary symptoms, h/a, dizziness, n/v, diarrhea, constipation or fever/chills.  She denies headache, visual changes or RUQ abdominal pain.  Back Pain This is a new problem. The current episode started today. The problem is unchanged. The pain is present in the lumbar spine. The quality of the pain is described as aching and cramping. The pain does not radiate. Pertinent negatives include no abdominal pain, dysuria, fever, headaches, leg pain, numbness, pelvic pain, tingling or weakness. She has tried nothing for the symptoms.  Vaginal Bleeding The patient's primary symptoms include vaginal bleeding. The patient's pertinent negatives include no genital itching, genital lesions, genital odor or pelvic pain. This is a new problem. The current episode started today. The problem occurs intermittently. The problem has been resolved. She is pregnant. Associated symptoms include back pain. Pertinent negatives include no abdominal pain, chills, constipation, diarrhea, dysuria, fever, headaches, nausea or vomiting. The vaginal discharge was bloody. The vaginal bleeding is typical of menses. She has not been passing clots. She has not been passing tissue. Nothing aggravates the symptoms. She has tried nothing for the symptoms.   RN Note: Noticed red vag bleeding about an hour ago when went to [facility]. No recent intercourse. Some lower back pain since noticed bleeding.   Past Medical History: Past Medical History:  Diagnosis Date  . Acute lateral meniscus tear of left knee 02/02/2014   . Acute medial meniscus tear of left knee   . Concussion with brief (less than one hour) loss of consciousness 4-28 15  . Depression     Past obstetric history: OB History  Gravida Para Term Preterm AB Living  1 0 0 0 0 0  SAB TAB Ectopic Multiple Live Births  0 0 0 0      # Outcome Date GA Lbr Len/2nd Weight Sex Delivery Anes PTL Lv  1 Current             Past Surgical History: Past Surgical History:  Procedure Laterality Date  . CHOLECYSTECTOMY N/A 04/10/2016   Procedure: LAPAROSCOPIC CHOLECYSTECTOMY;  Surgeon: Rodman Pickle, MD;  Location[facility] OR;  Service: General;  Laterality: N/A;  . KNEE ARTHROSCOPY WITH LATERAL MENISECTOMY Left 02/02/2014   Procedure: LEFT KNEE ARTHROSCOPY WITH LATERAL MENISECTOMY;  Surgeon: Eulas Post, MD;  Location: [facility];  Service: Orthopedics;  Laterality: Left;  . TONSILLECTOMY      Family History: Family History  Problem Relation Age of Onset  . Breast cancer Paternal Grandmother   . Healthy Mother   . Atrial fibrillation Father   . Prostate cancer Paternal Grandfather   . Cancer Paternal Grandfather        liver  . Migraines Maternal Uncle   . COPD Maternal Uncle     Social History: Social History   Tobacco Use  . Smoking status: Never Smoker  . Smokeless tobacco: Never Used  Substance Use Topics  . Alcohol use: No  . Drug use: No    Allergies: No Known Allergies  Meds:  Medications Prior to Admission  Medication Sig Dispense Refill Last  Dose  . aspirin EC 81 MG tablet Take 2 tablets (162 mg total) by mouth daily. 60 tablet 6   . labetalol (NORMODYNE) 200 MG tablet Take 1 tablet (200 mg total) by mouth 2 (two) times daily. 60 tablet 3   . Prenatal Vit-Fe Fumarate-FA (PRENATAL VITAMIN PO) Take by mouth daily.       I have reviewed patient's Past Medical Hx, Surgical Hx, Family Hx, Social Hx, medications and allergies.   ROS:  Review of Systems  Constitutional: Negative for chills and fever.   Gastrointestinal: Negative for abdominal pain, constipation, diarrhea, nausea and vomiting.  Genitourinary: Positive for vaginal bleeding. Negative for dysuria and pelvic pain.  Musculoskeletal: Positive for back pain.  Neurological: Negative for tingling, weakness, numbness and headaches.   Other systems negative  Physical Exam   Patient Vitals for the past 24 hrs:  BP Temp Pulse Resp Height Weight  05/24/19 2154 129/77 -- (!) 106 -- -- --  05/24/19 2140 132/87 -- (!) 105 -- -- --  05/24/19 2131 -- 97.7 F (36.5 C) -- 20 5\' 8"  (1.727 m) (!) 152.4 kg   Constitutional: Well-developed, well-nourished female in no acute distress.  Cardiovascular: normal rate and rhythm Respiratory: normal effort, clear to auscultation bilaterally GI: Abd soft, non-tender, gravid appropriate for gestational age.   No rebound or guarding. MS: Extremities nontender, no edema, normal ROM Neurologic: Alert and oriented x 4.  GU: Neg CVAT.  PELVIC EXAM: Cervix pink, visually closed, without lesion, moderate white creamy discharge, vaginal walls and external genitalia normal    No blood visible,  No color to discharge to reflect prior bleeding.  There is an area of redness at 6:00 position on cervix (like friability) but it does not bleed to touch with swab.    Dilation: Closed Effacement (%): Thick Exam by:: Wynelle BourgeoisMarie Baelyn Doring, CNM   FHT:  Baseline 140 , moderate variability, accelerations present, no decelerations Contractions: Rare   Labs: Results for orders placed or performed during the hospital encounter of 05/24/19 (from the past 24 hour(s))  Urinalysis, Routine w reflex microscopic     Status: Abnormal   Collection Time: 05/24/19  9:53 PM  Result Value Ref Range   Color, Urine YELLOW YELLOW   APPearance CLEAR CLEAR   Specific Gravity, Urine 1.020 1.005 - 1.030   pH 6.0 5.0 - 8.0   Glucose, UA NEGATIVE NEGATIVE mg/dL   Hgb urine dipstick SMALL (A) NEGATIVE   Bilirubin Urine NEGATIVE NEGATIVE    Ketones, ur 5 (A) NEGATIVE mg/dL   Protein, ur NEGATIVE NEGATIVE mg/dL   Nitrite NEGATIVE NEGATIVE   Leukocytes,Ua NEGATIVE NEGATIVE   RBC / HPF 0-5 0 - 5 RBC/hpf   WBC, UA 0-5 0 - 5 WBC/hpf   Bacteria, UA RARE (A) NONE SEEN   Squamous Epithelial / LPF 0-5 0 - 5   Mucus PRESENT     A/Positive/-- (09/14 1046)  Imaging:  US done a few days ago. Placenta was Anterior  MAU Course/MDM: I have ordered labs and reviewed results.  NST reviewed, reassuring for gestational age.  Consult Dr Shawnie PonsPratt with presentation, exam findings and test results.   Given no evidence for bleeding will proceed expectantly with strict bleeding precautions.   Treatments in MAU included Flexeril for back pain.  This did not help back pain I discussed possible UTI or kidney stone due to hematuria.  Pt now reports history of multiple kidney stones earlier this year, only passed one.   US done for  hx stones with back pain >> negative.  Discussed there still could be stones but no obstruction is evident now.   Tramadol given with good relief of pain  Assessment: Single intrauterine pregnancy at [redacted]w[redacted]d Bleeding, unknown source Low back pain History of multiple kidney stones Hematuria, probably related to stones  Plan: Discharge home Urine to culture Push PO fluids LImited Rx Tramadol #10tabs Bleeding precautions Preterm Labor precautions and fetal kick counts Follow up in Office for prenatal visits   Encouraged to return here or to other Urgent Care/ED if she develops worsening of symptoms, increase in pain, fever, or other concerning symptoms.   Pt stable at time of discharge.  Hansel Feinstein CNM, MSN Certified Nurse-Midwife 05/24/2019 9:54 PM

## 2019-05-24 NOTE — MAU Note (Signed)
Noticed red vag bleeding about an hour ago when went to [facility]. No recent intercourse. Some lower back pain since noticed bleeding.

## 2019-05-25 ENCOUNTER — Inpatient Hospital Stay (HOSPITAL_COMMUNITY): Payer: 59

## 2019-05-25 ENCOUNTER — Telehealth (INDEPENDENT_AMBULATORY_CARE_PROVIDER_SITE_OTHER): Payer: 59 | Admitting: *Deleted

## 2019-05-25 VITALS — BP 128/84 | HR 90

## 2019-05-25 DIAGNOSIS — Z7982 Long term (current) use of aspirin: Secondary | ICD-10-CM | POA: Diagnosis not present

## 2019-05-25 DIAGNOSIS — R319 Hematuria, unspecified: Secondary | ICD-10-CM | POA: Diagnosis not present

## 2019-05-25 DIAGNOSIS — Z0130 Encounter for examination of blood pressure without abnormal findings: Secondary | ICD-10-CM

## 2019-05-25 DIAGNOSIS — I10 Essential (primary) hypertension: Secondary | ICD-10-CM

## 2019-05-25 DIAGNOSIS — M545 Low back pain: Secondary | ICD-10-CM | POA: Diagnosis not present

## 2019-05-25 DIAGNOSIS — O4692 Antepartum hemorrhage, unspecified, second trimester: Secondary | ICD-10-CM | POA: Diagnosis not present

## 2019-05-25 DIAGNOSIS — Z79899 Other long term (current) drug therapy: Secondary | ICD-10-CM | POA: Diagnosis not present

## 2019-05-25 DIAGNOSIS — Z3A26 26 weeks gestation of pregnancy: Secondary | ICD-10-CM | POA: Diagnosis not present

## 2019-05-25 LAB — CULTURE, OB URINE

## 2019-05-25 MED ORDER — TRAMADOL HCL 50 MG PO TABS: 50.0000 mg | ORAL_TABLET | Freq: Four times a day (QID) | ORAL | 0 refills | Status: DC | PRN

## 2019-05-25 NOTE — Discharge Instructions (Signed)
Kidney Stones  Kidney stones (urolithiasis) are solid, rock-like deposits that form inside of the organs that make urine (kidneys). A kidney stone may form in a kidney and move into the bladder, where it can cause intense pain and block the flow of urine. Kidney stones are created when high levels of certain minerals are found in the urine. They are usually passed through urination, but in some cases, medical treatment may be needed to remove them. What are the causes? Kidney stones may be caused by:  A condition in which certain glands produce too much parathyroid hormone (primary hyperparathyroidism), which causes too much calcium buildup in the blood.  Buildup of uric acid crystals in the bladder (hyperuricosuria). Uric acid is a chemical that the body produces when you eat certain foods. It usually exits the body in the urine.  Narrowing (stricture) of one or both of the tubes that drain urine from the kidneys to the bladder (ureters).  A kidney blockage that is present at birth (congenital obstruction).  Past surgery on the kidney or the ureters, such as gastric bypass surgery. What increases the risk? The following factors make you more likely to develop kidney stones:  Having had a kidney stone in the past.  Having a family history of kidney stones.  Not drinking enough water.  Eating a diet that is high in protein, salt (sodium), or sugar.  Being overweight or obese. What are the signs or symptoms? Symptoms of a kidney stone may include:  Nausea.  Vomiting.  Blood in the urine (hematuria).  Pain in the side of the abdomen, right below the ribs (flank pain). Pain usually spreads (radiates) to the groin.  Needing to urinate frequently or urgently. How is this diagnosed? This condition may be diagnosed based on:  Your medical history.  A physical exam.  Blood tests.  Urine tests.  CT scan.  Abdominal X-ray.  A procedure to examine the inside of the bladder  (cystoscopy). How is this treated? Treatment for kidney stones depends on the size, location, and makeup of the stones. Treatment may involve:  Analyzing your urine before and after you pass the stone through urination.  Being monitored at the hospital until you pass the stone through urination.  Increasing your fluid intake and decreasing the amount of calcium and protein in your diet.  A procedure to break up kidney stones in the bladder using: ? A focused beam of light (laser therapy). ? Shock waves (extracorporeal shock wave lithotripsy).  Surgery to remove kidney stones. This may be needed if you have severe pain or have stones that block your urinary tract. Follow these instructions at home: Eating and drinking  Drink enough fluid to keep your urine clear or pale yellow. This will help you to pass the kidney stone.  If directed, change your diet. This may include: ? Limiting how much sodium you eat. ? Eating more fruits and vegetables. ? Limiting how much meat, poultry, fish, and eggs you eat.  Follow instructions from your health care provider about eating or drinking restrictions. General instructions  Collect urine samples as told by your health care provider. You may need to collect a urine sample: ? 24 hours after you pass the stone. ? 8-12 weeks after passing the kidney stone, and every 6-12 months after that.  Strain your urine every time you urinate, for as long as directed. Use the strainer that your health care provider recommends.  Do not throw out the kidney stone after passing  it. Keep the stone so it can be tested by your health care provider. Testing the makeup of your kidney stone may help prevent you from getting kidney stones in the future.  Take over-the-counter and prescription medicines only as told by your health care provider.  Keep all follow-up visits as told by your health care provider. This is important. You may need follow-up X-rays or  ultrasounds to make sure that your stone has passed. How is this prevented? To prevent another kidney stone:  Drink enough fluid to keep your urine clear or pale yellow. This is the best way to prevent kidney stones.  Eat a healthy diet and follow recommendations from your health care provider about foods to avoid. You may be instructed to eat a low-protein diet. Recommendations vary depending on the type of kidney stone that you have.  Maintain a healthy weight. Contact a health care provider if:  You have pain that gets worse or does not get better with medicine. Get help right away if:  You have a fever or chills.  You develop severe pain.  You develop new abdominal pain.  You faint.  You are unable to urinate. This information is not intended to replace advice given to you by your health care provider. Make sure you discuss any questions you have with your health care provider. Document Released: 05/25/2005 Document Revised: 01/04/2018 Document Reviewed: 11/08/2015 Elsevier Patient Education  2020 [employer] Vaginal Bleeding During Pregnancy, Second Trimester  A small amount of bleeding (spotting) from the vagina is common during pregnancy. Sometimes the bleeding is normal and is not a sign of problems. In some other cases, it is a sign of something serious. Tell your doctor right away if there is any bleeding from your vagina. Follow these instructions at home: Activity  Follow your doctor's instructions about how active you can be.  If needed, make plans for someone to help with your normal activities.  Do not exercise or do activities that take a lot of effort until your doctor says that this is safe.  Do not lift anything that is heavier than 10 lb (4.5 kg) until your doctor says that this is safe.  Do not have sex or orgasms until your doctor says that this is safe. Medicines  Take over-the-counter and prescription medicines only as told by your doctor.  Do  not take aspirin. It can cause bleeding. General instructions  Watch your condition for any changes.  Write down: ? The number of pads you use each day. ? How often you change pads. ? How soaked your pads are.  Do not use tampons.  Do not douche.  If you pass any tissue from your vagina, save it to show to your doctor.  Keep all follow-up visits as told by your doctor. This is important. Contact a doctor if:  You have bleeding in the vagina at any time during pregnancy.  You have cramps.  You have a fever that does not get better with medicine. Get help right away if:  You have very bad cramps in your back or belly (abdomen).  You have contractions.  You have chills.  You pass large clots or a lot of tissue from your vagina.  Your bleeding gets worse.  You feel light-headed.  You feel weak.  You pass out (faint).  You are leaking fluid from your vagina.  You have a gush of fluid from your vagina. Summary  Sometimes vaginal bleeding during pregnancy is normal and  is not a problem. Sometimes it may be a sign of something serious.  Tell your doctor about any bleeding from your vagina right away.  Follow your doctor's instructions about how active you can be. You may need someone to help you with your normal activities. This information is not intended to replace advice given to you by your health care provider. Make sure you discuss any questions you have with your health care provider. Document Released: 10/09/2013 Document Revised: 09/13/2018 Document Reviewed: 08/26/2016 Elsevier Patient Education  2020 [employer].

## 2019-05-25 NOTE — Progress Notes (Signed)
   NURSE VISIT- BLOOD PRESSURE CHECK  SUBJECTIVE:  Rebecca Gardner is a 23 y.o. G9P0000 female here for BP check. She is [redacted]w[redacted]d pregnant    HYPERTENSION ROS:  Pregnant/postpartum:  . Severe headaches that don't go away with tylenol/other medicines: No  . Visual changes (seeing spots/double/blurred vision) No  . Severe pain under right breast breast or in center of upper chest No  . Severe nausea/vomiting No  . Taking medicines as instructed yes   OBJECTIVE:  BP 128/84   Pulse 90   LMP 11/22/2018 (Approximate)   Appearance alert, well appearing, and in no distress.  ASSESSMENT: Pregnancy [redacted]w[redacted]d  blood pressure check  PLAN: Routed to Nigel Berthold, CNM   Recommendations: no changes needed   Follow-up: as scheduled   Roshon Duell, Celene Squibb  05/25/2019 2:50 PM

## 2019-05-26 ENCOUNTER — Other Ambulatory Visit (HOSPITAL_COMMUNITY): Payer: Self-pay | Admitting: Advanced Practice Midwife

## 2019-05-26 DIAGNOSIS — I10 Essential (primary) hypertension: Secondary | ICD-10-CM | POA: Diagnosis not present

## 2019-05-27 LAB — PROTEIN, URINE, 24 HOUR
Protein, 24H Urine: 138 mg/24 hr (ref 30–150)
Protein, Ur: 8.9 mg/dL

## 2019-06-07 ENCOUNTER — Other Ambulatory Visit: Payer: Self-pay | Admitting: Advanced Practice Midwife

## 2019-06-07 DIAGNOSIS — O10919 Unspecified pre-existing hypertension complicating pregnancy, unspecified trimester: Secondary | ICD-10-CM

## 2019-06-09 NOTE — L&D Delivery Note (Signed)
OB/GYN Faculty Practice Delivery Note  Rebecca Gardner is a 24 y.o. G1P1001 s/p NSVD at [redacted]w[redacted]d. She was admitted for IOL due to concern for Pre-E with severe features due to HA. HA went away with tylenol and most of her BP have been normal range here.   AROM: 3h 70m with clear fluid GBS Status: Positive/-- (03/01 0000) receiving PCN Maximum Maternal Temperature: 98.8 F  Labor Progress: . Patient presented to L&D for IOL due to concern for Pre-E with severe features. Initial SVE: 1cm. Labor course was uncomplicated. She progressed slowly and needed intermittent alternating cytotec and pitocin. She then progressed to complete.   Delivery Date/Time: 08/11/2019 @ 1324 Delivery: Called to room and patient was complete and pushing. Head position was LOA and delivered with ease over the perineum. No nuchal cord present. Shoulder and body delivered in usual fashion. Infant with spontaneous cry, placed on mother's abdomen, dried and stimulated. Cord clamped x 2 after 1-minute delay, and cut by FoB. Cord blood drawn. Placenta delivered spontaneously with gentle cord traction. Fundus firm with massage and pitocin started. Labia, perineum, vagina, and cervix inspected and significant for no lacerations.  Baby Weight: 2801g  Cord: central insertion, 3 vessel Placenta: Sent to L&D Complications: None Lacerations: None EBL: 150cc Analgesia: Epidural   Infant: APGAR (1 MIN): 9   APGAR (5 MINS): 9    Aldean Jewett MD, PGY-1 OBGYN Faculty Teaching Service  08/11/2019, 2:00 PM

## 2019-06-12 ENCOUNTER — Ambulatory Visit (INDEPENDENT_AMBULATORY_CARE_PROVIDER_SITE_OTHER): Payer: 59

## 2019-06-12 ENCOUNTER — Encounter: Payer: Self-pay | Admitting: Obstetrics & Gynecology

## 2019-06-12 ENCOUNTER — Ambulatory Visit (INDEPENDENT_AMBULATORY_CARE_PROVIDER_SITE_OTHER): Payer: 59 | Admitting: Obstetrics & Gynecology

## 2019-06-12 ENCOUNTER — Other Ambulatory Visit: Payer: 59

## 2019-06-12 ENCOUNTER — Other Ambulatory Visit: Payer: Self-pay

## 2019-06-12 VITALS — BP 124/84 | HR 125 | Wt 335.4 lb

## 2019-06-12 DIAGNOSIS — Z131 Encounter for screening for diabetes mellitus: Secondary | ICD-10-CM | POA: Diagnosis not present

## 2019-06-12 DIAGNOSIS — O0993 Supervision of high risk pregnancy, unspecified, third trimester: Secondary | ICD-10-CM

## 2019-06-12 DIAGNOSIS — Z1389 Encounter for screening for other disorder: Secondary | ICD-10-CM

## 2019-06-12 DIAGNOSIS — Z331 Pregnant state, incidental: Secondary | ICD-10-CM

## 2019-06-12 DIAGNOSIS — Z3A28 28 weeks gestation of pregnancy: Secondary | ICD-10-CM

## 2019-06-12 DIAGNOSIS — O0992 Supervision of high risk pregnancy, unspecified, second trimester: Secondary | ICD-10-CM

## 2019-06-12 DIAGNOSIS — O10919 Unspecified pre-existing hypertension complicating pregnancy, unspecified trimester: Secondary | ICD-10-CM

## 2019-06-12 DIAGNOSIS — Z362 Encounter for other antenatal screening follow-up: Secondary | ICD-10-CM

## 2019-06-12 DIAGNOSIS — O10913 Unspecified pre-existing hypertension complicating pregnancy, third trimester: Secondary | ICD-10-CM

## 2019-06-12 LAB — POCT URINALYSIS DIPSTICK OB
Blood, UA: NEGATIVE
Glucose, UA: NEGATIVE
Ketones, UA: NEGATIVE
Leukocytes, UA: NEGATIVE
Nitrite, UA: NEGATIVE
POC,PROTEIN,UA: NEGATIVE

## 2019-06-12 NOTE — Progress Notes (Signed)
[place] 28+6 wks,cephalic,anterior placenta gr 0,fhr 154 bpm,cx 3.3 cm,svp of fluid 5.8 cm,ovaries not visualized,bilat adnexa's wnl, efw 1244 g 26.8%

## 2019-06-12 NOTE — Progress Notes (Signed)
   HIGH-RISK PREGNANCY VISIT Patient name: Rebecca Gardner MRN [id number]  Date of birth: 11/19/1995 Chief Complaint:   High Risk Gestation (PN2/ U/S)  History of Present Illness:   Rebecca Gardner is a 24 y.o. G23P0000 female at [redacted]w[redacted]d with an Estimated Date of Delivery: 08/29/19 being seen today for ongoing management of a high-risk pregnancy complicated by chronic hypertension on labetalol 200 mg BID.  Today she reports no complaints. Contractions: Not present.  .  Movement: Present. denies leaking of fluid.  Review of Systems:   Pertinent items are noted in HPI Denies abnormal vaginal discharge w/ itching/odor/irritation, headaches, visual changes, shortness of breath, chest pain, abdominal pain, severe nausea/vomiting, or problems with urination or bowel movements unless otherwise stated above. Pertinent History Reviewed:  Reviewed past medical,surgical, social, obstetrical and family history.  Reviewed problem list, medications and allergies. Physical Assessment:   Vitals:   06/12/19 0847  BP: 124/84  Pulse: (!) 125  Weight: (!) 335 lb 6.4 oz (152.1 kg)  Body mass index is 51 kg/m.           Physical Examination:   General appearance: alert, well appearing, and in no distress  Mental status: alert, oriented to person, place, and time  Skin: warm & dry   Extremities: Edema: None    Cardiovascular: normal heart rate noted  Respiratory: normal respiratory effort, no distress  Abdomen: gravid, soft, non-tender  Pelvic: Cervical exam deferred         Fetal Status:     Movement: Present    Fetal Surveillance Testing today: sonogram normal   Chaperone: n/a    Results for orders placed or performed in visit on 06/12/19 (from the past 24 hour(s))  POC Urinalysis Dipstick OB   Collection Time: 06/12/19  8:54 AM  Result Value Ref Range   Color, UA     Clarity, UA     Glucose, UA Negative Negative   Bilirubin, UA     Ketones, UA neg    Spec Grav, UA     Blood, UA neg    pH, UA      POC,PROTEIN,UA Negative Negative, Trace, Small (1+), Moderate (2+), Large (3+), 4+   Urobilinogen, UA     Nitrite, UA neg    Leukocytes, UA Negative Negative   Appearance     Odor      Assessment & Plan:  1) High-risk pregnancy G1P0000 at [redacted]w[redacted]d with an Estimated Date of Delivery: 08/29/19   2) CHTN, stable, labetalol 200 BID + aspirin    Meds: No orders of the defined types were placed in this encounter.   Labs/procedures today: sonoggram + PN2  Treatment Plan:  Begin twice weekly fetal testing at 32 weeks sonogram alternating with NST  Reviewed: Preterm labor symptoms and general obstetric precautions including but not limited to vaginal bleeding, contractions, leaking of fluid and fetal movement were reviewed in detail with the patient.  All questions were answered. Has home bp cuff. Rx faxed to  Check bp weekly, let us know if >140/90.   Follow-up: Return in about 2 weeks (around 06/26/2019) for HROB.  Orders Placed This Encounter  Procedures  . POC Urinalysis Dipstick OB   Humza Tallerico H Claudina Oliphant 06/12/2019 10:14 AM

## 2019-06-13 LAB — CBC
Hematocrit: 37.9 % (ref 34.0–46.6)
Hemoglobin: 11.8 g/dL (ref 11.1–15.9)
MCH: 25.9 pg — ABNORMAL LOW (ref 26.6–33.0)
MCHC: 31.1 g/dL — ABNORMAL LOW (ref 31.5–35.7)
MCV: 83 fL (ref 79–97)
Platelets: 286 10*3/uL (ref 150–450)
RBC: 4.55 x10E6/uL (ref 3.77–5.28)
RDW: 13.9 % (ref 11.7–15.4)
WBC: 13.4 10*3/uL — ABNORMAL HIGH (ref 3.4–10.8)

## 2019-06-13 LAB — GLUCOSE TOLERANCE, 2 HOURS W/ 1HR
Glucose, 1 hour: 170 mg/dL (ref 65–179)
Glucose, 2 hour: 126 mg/dL (ref 65–152)
Glucose, Fasting: 91 mg/dL (ref 65–91)

## 2019-06-13 LAB — RPR: RPR Ser Ql: NONREACTIVE

## 2019-06-13 LAB — HIV ANTIBODY (ROUTINE TESTING W REFLEX): HIV Screen 4th Generation wRfx: NONREACTIVE

## 2019-06-13 LAB — ANTIBODY SCREEN: Antibody Screen: NEGATIVE

## 2019-06-26 ENCOUNTER — Other Ambulatory Visit: Payer: Self-pay

## 2019-06-26 ENCOUNTER — Encounter: Payer: Self-pay | Admitting: Obstetrics & Gynecology

## 2019-06-26 ENCOUNTER — Ambulatory Visit (INDEPENDENT_AMBULATORY_CARE_PROVIDER_SITE_OTHER): Payer: 59 | Admitting: Obstetrics & Gynecology

## 2019-06-26 VITALS — BP 122/81 | HR 124 | Wt 340.4 lb

## 2019-06-26 DIAGNOSIS — Z1389 Encounter for screening for other disorder: Secondary | ICD-10-CM

## 2019-06-26 DIAGNOSIS — Z3A30 30 weeks gestation of pregnancy: Secondary | ICD-10-CM

## 2019-06-26 DIAGNOSIS — O0993 Supervision of high risk pregnancy, unspecified, third trimester: Secondary | ICD-10-CM

## 2019-06-26 DIAGNOSIS — O10913 Unspecified pre-existing hypertension complicating pregnancy, third trimester: Secondary | ICD-10-CM

## 2019-06-26 DIAGNOSIS — O10919 Unspecified pre-existing hypertension complicating pregnancy, unspecified trimester: Secondary | ICD-10-CM

## 2019-06-26 DIAGNOSIS — Z331 Pregnant state, incidental: Secondary | ICD-10-CM

## 2019-06-26 LAB — POCT URINALYSIS DIPSTICK OB
Blood, UA: NEGATIVE
Glucose, UA: NEGATIVE
Ketones, UA: NEGATIVE
Leukocytes, UA: NEGATIVE
Nitrite, UA: NEGATIVE

## 2019-06-26 NOTE — Progress Notes (Signed)
   HIGH-RISK PREGNANCY VISIT Patient name: Rebecca Gardner MRN [id number]  Date of birth: 04/23/1996 Chief Complaint:   High Risk Gestation  History of Present Illness:   Rebecca Gardner is a 24 y.o. G16P0000 female at [redacted]w[redacted]d with an Estimated Date of Delivery: 08/29/19 being seen today for ongoing management of a high-risk pregnancy complicated by chronic hypertension on labetalol 200 mg BID.  Today she reports no complaints. Contractions: Not present.  .  Movement: Present. denies leaking of fluid.  Review of Systems:   Pertinent items are noted in HPI Denies abnormal vaginal discharge w/ itching/odor/irritation, headaches, visual changes, shortness of breath, chest pain, abdominal pain, severe nausea/vomiting, or problems with urination or bowel movements unless otherwise stated above. Pertinent History Reviewed:  Reviewed past medical,surgical, social, obstetrical and family history.  Reviewed problem list, medications and allergies. Physical Assessment:   Vitals:   06/26/19 0905  BP: 122/81  Pulse: (!) 124  Weight: (!) 340 lb 6.4 oz (154.4 kg)  Body mass index is 51.76 kg/m.           Physical Examination:   General appearance: alert, well appearing, and in no distress  Mental status: alert, oriented to person, place, and time  Skin: warm & dry   Extremities: Edema: Trace    Cardiovascular: normal heart rate noted  Respiratory: normal respiratory effort, no distress  Abdomen: gravid, soft, non-tender  Pelvic: Cervical exam deferred         Fetal Status: Fetal Heart Rate (bpm): 141 Fundal Height: 32 cm Movement: Present    Fetal Surveillance Testing today: FHR 141   Chaperone: n/a    Results for orders placed or performed in visit on 06/26/19 (from the past 24 hour(s))  POC Urinalysis Dipstick OB   Collection Time: 06/26/19  9:12 AM  Result Value Ref Range   Color, UA     Clarity, UA     Glucose, UA Negative Negative   Bilirubin, UA     Ketones, UA neg    Spec Grav, UA      Blood, UA neg    pH, UA     POC,PROTEIN,UA Trace Negative, Trace, Small (1+), Moderate (2+), Large (3+), 4+   Urobilinogen, UA     Nitrite, UA neg    Leukocytes, UA Negative Negative   Appearance     Odor      Assessment & Plan:  1) High-risk pregnancy G1P0000 at [redacted]w[redacted]d with an Estimated Date of Delivery: 08/29/19   2) CHTN, stable, continue labetalol as ordered and 162 mg aspirin    Meds: No orders of the defined types were placed in this encounter.   Labs/procedures today:   Treatment Plan:  Twice weekly surveillance, sonogram alternating with NST, induction at 39 weeks or as clinically indicated   Reviewed: Preterm labor symptoms and general obstetric precautions including but not limited to vaginal bleeding, contractions, leaking of fluid and fetal movement were reviewed in detail with the patient.  All questions were answered. Has home bp cuff. Rx faxed to . Check bp weekly, let us know if >140/90.   Follow-up: Return in about 8 days (around 07/04/2019) for HROB, BPP/sono.  Orders Placed This Encounter  Procedures  . US Fetal BPP W/O Non Stress  . [place] UA Cord Doppler  . US OB Follow Up  . POC Urinalysis Dipstick OB   Lazaro Arms  06/26/2019 9:44 AM

## 2019-07-04 ENCOUNTER — Ambulatory Visit (INDEPENDENT_AMBULATORY_CARE_PROVIDER_SITE_OTHER): Payer: 59 | Admitting: Obstetrics and Gynecology

## 2019-07-04 ENCOUNTER — Other Ambulatory Visit: Payer: Self-pay

## 2019-07-04 ENCOUNTER — Ambulatory Visit (INDEPENDENT_AMBULATORY_CARE_PROVIDER_SITE_OTHER): Payer: 59

## 2019-07-04 VITALS — BP 137/96 | HR 135 | Wt 337.0 lb

## 2019-07-04 DIAGNOSIS — Z362 Encounter for other antenatal screening follow-up: Secondary | ICD-10-CM

## 2019-07-04 DIAGNOSIS — I471 Supraventricular tachycardia: Secondary | ICD-10-CM

## 2019-07-04 DIAGNOSIS — Z1389 Encounter for screening for other disorder: Secondary | ICD-10-CM

## 2019-07-04 DIAGNOSIS — Z331 Pregnant state, incidental: Secondary | ICD-10-CM

## 2019-07-04 DIAGNOSIS — O10913 Unspecified pre-existing hypertension complicating pregnancy, third trimester: Secondary | ICD-10-CM

## 2019-07-04 DIAGNOSIS — Z3A32 32 weeks gestation of pregnancy: Secondary | ICD-10-CM

## 2019-07-04 DIAGNOSIS — O10919 Unspecified pre-existing hypertension complicating pregnancy, unspecified trimester: Secondary | ICD-10-CM

## 2019-07-04 DIAGNOSIS — O0993 Supervision of high risk pregnancy, unspecified, third trimester: Secondary | ICD-10-CM

## 2019-07-04 DIAGNOSIS — O99413 Diseases of the circulatory system complicating pregnancy, third trimester: Secondary | ICD-10-CM

## 2019-07-04 LAB — POCT URINALYSIS DIPSTICK OB
Blood, UA: NEGATIVE
Glucose, UA: NEGATIVE
Ketones, UA: NEGATIVE
Leukocytes, UA: NEGATIVE
Nitrite, UA: NEGATIVE
POC,PROTEIN,UA: NEGATIVE

## 2019-07-04 NOTE — Progress Notes (Signed)
[place] 32 wks,cephalic,BPP 8/8,anterior placenta gr 2,fhr 154 bpm,afi 19 cm,RI .57,.59,.62,.55=35% S/D 2.4=30%,EFW 1982 g 54%

## 2019-07-04 NOTE — Progress Notes (Addendum)
Patient ID: Rebecca Gardner, female   DOB: Oct 29, 1995, 24 y.o.   MRN: [id number]    [facility] PREGNANCY VISIT Patient name: Rebecca Gardner MRN [id number]  Date of birth: Jul 29, 1995 Chief Complaint:   Routine Prenatal Visit  History of Present Illness:   Rebecca Gardner is a 24 y.o. G28P0000 female at [redacted]w[redacted]d with an Estimated Date of Delivery: 08/29/19 being seen today for ongoing management of a high-risk pregnancy complicated by chronic hypertension currently on labetalol 200 mg BID. She feels herself going into SVT she will put pulseoxy on her finger and her rate is in the 160. Symptoms happen a few times a week. She hasn't been seen by cardiology. She works as a Buyer, retail for [employer]. Today she reports SVT as described above.. Contractions: Not present. Vag. Bleeding: None.  Movement: Present. denies leaking of fluid.  Review of Systems:   Pertinent items are noted in HPI Denies abnormal vaginal discharge w/ itching/odor/irritation, headaches, visual changes, shortness of breath, chest pain, abdominal pain, severe nausea/vomiting, or problems with urination or bowel movements unless otherwise stated above. Pertinent History Reviewed:  Reviewed past medical,surgical, social, obstetrical and family history.  Reviewed problem list, medications and allergies. Physical Assessment:   Vitals:   07/04/19 1000  BP: (!) 137/96  Pulse: (!) 135  Weight: (!) 337 lb (152.9 kg)  Body mass index is 51.24 kg/m.           Physical Examination:   General appearance: alert, well appearing, and in no distress  Mental status: alert, oriented to person, place, and time, normal mood, behavior, speech, dress, motor activity, and thought processes, affect appropriate to mood  Skin: warm & dry   Extremities: Edema: Trace    Cardiovascular: normal heart rate noted  Respiratory: normal respiratory effort, no distress  Abdomen: gravid, soft, non-tender  Pelvic: Cervical exam deferred         Fetal  Status:     Movement: Present    Fetal Surveillance Testing today: [place] 32 wks,cephalic,BPP 8/8,anterior placenta gr 2,fhr 154 bpm,afi 19 cm,RI .57,.59,.62,.55=35% S/D 2.4=30%,EFW 1982 g 54%   Chaperone: Arnette Norris    Results for orders placed or performed in visit on 07/04/19 (from the past 24 hour(s))  POC Urinalysis Dipstick OB   Collection Time: 07/04/19  9:58 AM  Result Value Ref Range   Color, UA     Clarity, UA     Glucose, UA Negative Negative   Bilirubin, UA     Ketones, UA n    Spec Grav, UA     Blood, UA n    pH, UA     POC,PROTEIN,UA Negative Negative, Trace, Small (1+), Moderate (2+), Large (3+), 4+   Urobilinogen, UA     Nitrite, UA n    Leukocytes, UA Negative Negative   Appearance     Odor      Assessment & Plan:  1) High-risk pregnancy G1P0000 at [redacted]w[redacted]d with an Estimated Date of Delivery: 08/29/19   2) CHTN, increase to 200 TID  3) Supraventricular tachycardia (SVT), call for holter monitor  Meds: No orders of the defined types were placed in this encounter.  Labs/procedures today: [place] 32 wks,cephalic,BPP 8/8,anterior placenta gr 2,fhr 154 bpm,afi 19 cm,RI .57,.59,.62,.55=35% S/D 2.4=30%,EFW 1982 g 54%  Treatment Plan:  Increase labetalol to 200 mg TID 48 hour holter monitoring ordered,  Reviewed: Preterm labor symptoms and general obstetric precautions including but not limited to vaginal bleeding, contractions, leaking of fluid and fetal movement  were reviewed in detail with the patient.  All questions were answered. Has home bp cuff. Check bp several times weekly, let us know if >607 systolic /37 diastolic.   Follow-up: No follow-ups on file.  Orders Placed This Encounter  Procedures  . POC Urinalysis Dipstick OB   By signing my name below, I, Samul Dada, attest that this documentation has been prepared under the direction and in the presence of Jonnie Kind, MD. Electronically Signed: [place]. 07/04/19. 10:18 AM.  I  personally performed the services described in this documentation, which was SCRIBED in my presence. The recorded information has been reviewed and considered accurate. It has been edited as necessary during review. Jonnie Kind, MD

## 2019-07-05 ENCOUNTER — Other Ambulatory Visit: Payer: Self-pay | Admitting: *Deleted

## 2019-07-05 DIAGNOSIS — O99419 Diseases of the circulatory system complicating pregnancy, unspecified trimester: Secondary | ICD-10-CM

## 2019-07-05 DIAGNOSIS — O0990 Supervision of high risk pregnancy, unspecified, unspecified trimester: Secondary | ICD-10-CM

## 2019-07-05 DIAGNOSIS — I471 Supraventricular tachycardia: Secondary | ICD-10-CM | POA: Insufficient documentation

## 2019-07-05 DIAGNOSIS — I10 Essential (primary) hypertension: Secondary | ICD-10-CM

## 2019-07-05 NOTE — Patient Instructions (Signed)
Ambulatory Cardiac Monitoring An ambulatory cardiac monitor is a small recording device that is used to detect abnormal heart rhythms (arrhythmias). Most monitors are connected by wires to flat, sticky disks (electrodes) that are then attached to your chest. You may need to wear a monitor if you have had symptoms such as:  Fast heartbeats (palpitations).  Dizziness.  Fainting or light-headedness.  Unexplained weakness.  Shortness of breath. There are several types of monitors. Some common monitors include:  Holter monitor. This records your heart rhythm continuously, usually for 24-48 hours.  Event (episodic) monitor. This monitor has a symptoms button, and when pushed, it will begin recording. You need to activate this monitor to record when you have a heart-related symptom.  Automatic detection monitor. This monitor will begin recording when it detects an abnormal heartbeat. What are the risks? Generally, these devices are safe to use. However, it is possible that the skin under the electrodes will become irritated. How to prepare for monitoring Your health care provider will prepare your chest for the electrode placement and show you how to use the monitor.  Do not apply lotions to your chest before monitoring.  Follow directions on how to care for the monitor, and how to return the monitor when the testing period is complete. How to use your cardiac monitor  Follow directions about how long to wear the monitor, and if you can take the monitor off in order to shower or bathe. ? Do not let the monitor get wet. ? Do not bathe, swim, or use a hot tub while wearing the monitor.  Keep your skin clean. Do not put body lotion or moisturizer on your chest.  Change the electrodes as told by your health care provider, or any time they stop sticking to your skin. You may need to use medical tape to keep them on.  Try to put the electrodes in slightly different places on your chest to  help prevent skin irritation. Follow directions from your health care provider about where to place the electrodes.  Make sure the monitor is safely clipped to your clothing or in a location close to your body as recommended by your health care provider.  If your monitor has a symptoms button, press the button to mark an event as soon as you feel a heart-related symptom, such as: ? Dizziness. ? Weakness. ? Light-headedness. ? Palpitations. ? Thumping or pounding in your chest. ? Shortness of breath. ? Unexplained weakness.  Keep a diary of your activities, such as walking, doing chores, and taking medicine. It is very important to note what you were doing when you pushed the button to record your symptoms. This will help your health care provider determine what might be contributing to your symptoms.  Send the recorded information as recommended by your health care provider. It may take some time for your health care provider to process the results.  Change the batteries as told by your health care provider.  Keep electronic devices away from your monitor. These include: ? Tablets. ? MP3 players. ? Cell phones.  While wearing your monitor you should avoid: ? Electric blankets. ? Electric razors. ? Electric toothbrushes. ? Microwave ovens. ? Magnets. ? Metal detectors. Get help right away if:  You have chest pain.  You have shortness of breath or extreme difficulty breathing.  You develop a very fast heartbeat that does not get better.  You develop dizziness that does not go away.  You faint or constantly feel like   you are about to faint. Summary  An ambulatory cardiac monitor is a small recording device that is used to detect abnormal heart rhythms (arrhythmias).  Make sure you understand how to send the information from the monitor to your health care provider.  It is important to press the button on the monitor when you have any heart-related symptoms.  Keep a  diary of your activities, such as walking, doing chores, and taking medicine. It is very important to note what you were doing when you pushed the button to record your symptoms. This will help your health care provider learn what might be causing your symptoms. This information is not intended to replace advice given to you by your health care provider. Make sure you discuss any questions you have with your health care provider. Document Revised: 05/07/2017 Document Reviewed: 05/09/2016 Elsevier Patient Education  2020 [employer].

## 2019-07-06 ENCOUNTER — Telehealth: Payer: Self-pay | Admitting: *Deleted

## 2019-07-06 NOTE — Telephone Encounter (Signed)
Patient states her husband tested positive for COVID yesterday and she was advised to get tested today by health at work.  She is experiencing a sore throat and cough.  Will let us know results before appt on Monday.

## 2019-07-07 ENCOUNTER — Other Ambulatory Visit: Payer: Self-pay

## 2019-07-07 ENCOUNTER — Encounter (HOSPITAL_COMMUNITY): Payer: Self-pay | Admitting: Obstetrics and Gynecology

## 2019-07-07 ENCOUNTER — Other Ambulatory Visit: Payer: 59

## 2019-07-07 ENCOUNTER — Encounter (INDEPENDENT_AMBULATORY_CARE_PROVIDER_SITE_OTHER): Payer: Self-pay

## 2019-07-07 ENCOUNTER — Inpatient Hospital Stay (HOSPITAL_COMMUNITY)
Admission: AD | Admit: 2019-07-07 | Discharge: 2019-07-07 | Disposition: A | Discharge status: Home / Self Care | Payer: 59 | Source: Ambulatory Visit | Attending: Obstetrics and Gynecology | Admitting: Obstetrics and Gynecology

## 2019-07-07 ENCOUNTER — Telehealth: Payer: Self-pay

## 2019-07-07 DIAGNOSIS — O26893 Other specified pregnancy related conditions, third trimester: Secondary | ICD-10-CM | POA: Diagnosis not present

## 2019-07-07 DIAGNOSIS — O10013 Pre-existing essential hypertension complicating pregnancy, third trimester: Secondary | ICD-10-CM | POA: Diagnosis not present

## 2019-07-07 DIAGNOSIS — Z3A32 32 weeks gestation of pregnancy: Secondary | ICD-10-CM | POA: Insufficient documentation

## 2019-07-07 DIAGNOSIS — M5430 Sciatica, unspecified side: Secondary | ICD-10-CM | POA: Diagnosis not present

## 2019-07-07 DIAGNOSIS — O98513 Other viral diseases complicating pregnancy, third trimester: Secondary | ICD-10-CM

## 2019-07-07 DIAGNOSIS — O0990 Supervision of high risk pregnancy, unspecified, unspecified trimester: Secondary | ICD-10-CM

## 2019-07-07 DIAGNOSIS — M5431 Sciatica, right side: Secondary | ICD-10-CM

## 2019-07-07 DIAGNOSIS — U071 COVID-19: Secondary | ICD-10-CM

## 2019-07-07 DIAGNOSIS — R000 Tachycardia, unspecified: Secondary | ICD-10-CM

## 2019-07-07 DIAGNOSIS — M545 Low back pain: Secondary | ICD-10-CM | POA: Diagnosis present

## 2019-07-07 DIAGNOSIS — R202 Paresthesia of skin: Secondary | ICD-10-CM

## 2019-07-07 DIAGNOSIS — O10919 Unspecified pre-existing hypertension complicating pregnancy, unspecified trimester: Secondary | ICD-10-CM

## 2019-07-07 DIAGNOSIS — Z7982 Long term (current) use of aspirin: Secondary | ICD-10-CM | POA: Diagnosis not present

## 2019-07-07 HISTORY — DX: Essential (primary) hypertension: I10

## 2019-07-07 LAB — COMPREHENSIVE METABOLIC PANEL
ALT: 24 U/L (ref 0–44)
AST: 23 U/L (ref 15–41)
Albumin: 2.9 g/dL — ABNORMAL LOW (ref 3.5–5.0)
Alkaline Phosphatase: 81 U/L (ref 38–126)
Anion gap: 11 (ref 5–15)
BUN: 5 mg/dL — ABNORMAL LOW (ref 6–20)
CO2: 18 mmol/L — ABNORMAL LOW (ref 22–32)
Calcium: 9.3 mg/dL (ref 8.9–10.3)
Chloride: 104 mmol/L (ref 98–111)
Creatinine, Ser: 0.57 mg/dL (ref 0.44–1.00)
GFR calc Af Amer: >60 mL/min (ref >60)
GFR calc non Af Amer: >60 mL/min (ref >60)
Glucose, Bld: 96 mg/dL (ref 70–99)
Potassium: 3.9 mmol/L (ref 3.5–5.1)
Sodium: 133 mmol/L — ABNORMAL LOW (ref 135–145)
Total Bilirubin: 0.1 mg/dL — ABNORMAL LOW (ref 0.3–1.2)
Total Protein: 6.7 g/dL (ref 6.5–8.1)

## 2019-07-07 LAB — CBC
HCT: 37.5 % (ref 36.0–46.0)
Hemoglobin: 12.2 g/dL (ref 12.0–15.0)
MCH: 26.8 pg (ref 26.0–34.0)
MCHC: 32.5 g/dL (ref 30.0–36.0)
MCV: 82.4 fL (ref 80.0–100.0)
Platelets: 267 10*3/uL (ref 150–400)
RBC: 4.55 MIL/uL (ref 3.87–5.11)
RDW: 13.8 % (ref 11.5–15.5)
WBC: 9.7 10*3/uL (ref 4.0–10.5)
nRBC: 0 % (ref 0.0–0.2)

## 2019-07-07 LAB — PROTEIN / CREATININE RATIO, URINE
Creatinine, Urine: 63.57 mg/dL
Total Protein, Urine: <6 mg/dL

## 2019-07-07 LAB — URINALYSIS, ROUTINE W REFLEX MICROSCOPIC
Bilirubin Urine: NEGATIVE
Glucose, UA: NEGATIVE mg/dL
Ketones, ur: NEGATIVE mg/dL
Nitrite: NEGATIVE
Protein, ur: NEGATIVE mg/dL
Specific Gravity, Urine: 1.009 (ref 1.005–1.030)
pH: 6 (ref 5.0–8.0)

## 2019-07-07 MED ORDER — HYDROMORPHONE HCL 1 MG/ML IJ SOLN
0.5000 mg | Freq: Once | INTRAMUSCULAR | Status: AC
  Administered 2019-07-07: 0.5 mg via INTRAMUSCULAR
  Filled 2019-07-07: qty 1

## 2019-07-07 MED ORDER — GUAIFENESIN ER 600 MG PO TB12: 600.0000 mg | ORAL_TABLET | Freq: Two times a day (BID) | ORAL | 2 refills | Status: DC

## 2019-07-07 MED ORDER — CYCLOBENZAPRINE HCL 10 MG PO TABS
10.0000 mg | ORAL_TABLET | Freq: Once | ORAL | Status: AC
  Administered 2019-07-07: 10 mg via ORAL
  Filled 2019-07-07: qty 1

## 2019-07-07 MED ORDER — CYCLOBENZAPRINE HCL 10 MG PO TABS: 10.0000 mg | ORAL_TABLET | Freq: Three times a day (TID) | ORAL | 2 refills | Status: DC | PRN

## 2019-07-07 NOTE — Discharge Instructions (Signed)
Piriformis Syndrome  Piriformis syndrome is a condition that can cause pain and numbness in your buttocks and down the back of your leg. Piriformis syndrome happens when the small muscle that connects the base of your spine to your hip (piriformis muscle) presses on the nerve that runs down the back of your leg (sciatic nerve). The piriformis muscle helps your hip rotate and helps to bring your leg back and out. It also helps shift your weight to keep you stable while you are walking. The sciatic nerve runs under or through the piriformis muscle. Damage to the piriformis muscle can cause spasms that put pressure on the nerve below. This causes pain and discomfort while sitting and moving. The pain may feel as if it begins in the buttock and spreads (radiates) down your hip and thigh. What are the causes? This condition is caused by pressure on the sciatic nerve from the piriformis muscle. The piriformis muscle can get irritated with overuse, especially if other hip muscles are weak and the piriformis muscle has to do extra work. Piriformis syndrome can also occur after an injury, like a fall onto your buttocks. What increases the risk? You are more likely to develop this condition if you:  Are a woman.  Sit for long periods of time.  Are a cyclist.  Have weak buttocks muscles (gluteal muscles). What are the signs or symptoms? Symptoms of this condition include:  Pain, tingling, or numbness that starts in the buttock and runs down the back of your leg (sciatica).  Pain in the groin or thigh area. Your symptoms may get worse:  The longer you sit.  When you walk, run, or climb stairs.  When straining to have a bowel movement. How is this diagnosed? This condition is diagnosed based on your symptoms, medical history, and physical exam.  During the exam, your health care provider may: ? Move your leg into different positions to check for pain. ? Press on the muscles of your hip and  buttock to see if that increases your symptoms.  You may also have tests, including: ? Imaging tests such as X-rays, MRI, or ultrasound. ? Electromyogram (EMG). This test measures electrical signals sent by your nerves into the muscles. ? Nerve conduction study. This test measures how well electrical signals pass through your nerves. How is this treated? This condition may be treated by:  Stopping all activities that cause pain or make your condition worse.  Applying ice or using heat therapy.  Taking medicines to reduce pain and swelling.  Taking a muscle relaxer (muscle relaxant) to stop muscle spasms.  Doing range-of-motion and strengthening exercises (physical therapy) as told by your health care provider.  Massaging the area.  Having acupuncture.  Getting an injection of medicine in the piriformis muscle. Your health care provider will choose the medicine based on your condition. He or she may inject: ? An anti-inflammatory medicine (steroid) to reduce swelling. ? A numbing medicine (local anesthetic) to block the pain. ? Botulinum toxin. The toxin blocks nerve impulses to specific muscles to reduce muscle tension. In rare cases, you may need surgery to cut the muscle and release pressure on the nerve if other treatments do not work. Follow these instructions at home: Activity  Do not sit for long periods. Get up and walk around every 20 minutes or as often as told by your health care provider. ? When driving long distances, make sure to take frequent stops to get up and stretch.  Use a  cushion when you sit on hard surfaces.  Do exercises as told by your health care provider.  Return to your normal activities as told by your health care provider. Ask your health care provider what activities are safe for you. Managing pain, stiffness, and swelling      If directed, apply heat to the affected area as often as told by your health care provider. Use the heat source that  your health care provider recommends, such as a moist heat pack or a heating pad. ? Place a towel between your skin and the heat source. ? Leave the heat on for 20-30 minutes. ? Remove the heat if your skin turns bright red. This is especially important if you are unable to feel pain, heat, or cold. You may have a greater risk of getting burned.  If directed, put ice on the injured area. ? Put ice in a plastic bag. ? Place a towel between your skin and the bag. ? Leave the ice on for 20 minutes, 2-3 times a day. General instructions  Take over-the-counter and prescription medicines only as told by your health care provider.  Ask your health care provider if the medicine prescribed to you requires you to avoid driving or using heavy machinery.  You may need to take actions to prevent or treat constipation, such as: ? Drink enough fluid to keep your urine pale yellow. ? Take over-the-counter or prescription medicines. ? Eat foods that are high in fiber, such as beans, whole grains, and fresh fruits and vegetables. ? Limit foods that are high in fat and processed sugars, such as fried or sweet foods.  Keep all follow-up visits as told by your health care provider. This is important. How is this prevented?  Do not sit for longer than 20 minutes at a time. When you sit, choose padded surfaces.  Warm up and stretch before being active.  Cool down and stretch after being active.  Give your body time to rest between periods of activity.  Make sure to use equipment that fits you.  Maintain physical fitness, including: ? Strength. ? Flexibility. Contact a health care provider if:  Your pain and stiffness continue or get worse.  Your leg or hip becomes weak.  You have changes in your bowel function or bladder function. Summary  Piriformis syndrome is a condition that can cause pain, tingling, and numbness in your buttocks and down the back of your leg.  You may try applying heat  or ice to relieve the pain.  Do not sit for long periods. Get up and walk around every 20 minutes or as often as told by your health care provider. This information is not intended to replace advice given to you by your health care provider. Make sure you discuss any questions you have with your health care provider. Document Revised: 09/15/2018 Document Reviewed: 01/19/2018 Elsevier Patient Education  [place]. Sciatica  Sciatica is pain, numbness, weakness, or tingling along the path of the sciatic nerve. The sciatic nerve starts in the lower back and runs down the back of each leg. The nerve controls the muscles in the lower leg and in the back of the knee. It also provides feeling (sensation) to the back of the thigh, the lower leg, and the sole of the foot. Sciatica is a symptom of another medical condition that pinches or puts pressure on the sciatic nerve. Sciatica most often only affects one side of the body. Sciatica usually goes away on  its own or with treatment. In some cases, sciatica may come back (recur). What are the causes? This condition is caused by pressure on the sciatic nerve or pinching of the nerve. This may be the result of:  A disk in between the bones of the spine bulging out too far (herniated disk).  Age-related changes in the spinal disks.  A pain disorder that affects a muscle in the buttock.  Extra bone growth near the sciatic nerve.  A break (fracture) of the pelvis.  Pregnancy.  Tumor. This is rare. What increases the risk? The following factors may make you more likely to develop this condition:  Playing sports that place pressure or stress on the spine.  Having poor strength and flexibility.  A history of back injury or surgery.  Sitting for long periods of time.  Doing activities that involve repetitive bending or lifting.  Obesity. What are the signs or symptoms? Symptoms can vary from mild to very severe, and they may  include:  Any of these problems in the lower back, leg, hip, or buttock: ? Mild tingling, numbness, or dull aches. ? Burning sensations. ? Sharp pains.  Numbness in the back of the calf or the sole of the foot.  Leg weakness.  Severe back pain that makes movement difficult. Symptoms may get worse when you cough, sneeze, or laugh, or when you sit or stand for long periods of time. How is this diagnosed? This condition may be diagnosed based on:  Your symptoms and medical history.  A physical exam.  Blood tests.  Imaging tests, such as: ? X-rays. ? MRI. ? CT scan. How is this treated? In many cases, this condition improves on its own without treatment. However, treatment may include:  Reducing or modifying physical activity.  Exercising and stretching.  Icing and applying heat to the affected area.  Medicines that help to: ? Relieve pain and swelling. ? Relax your muscles.  Injections of medicines that help to relieve pain, irritation, and inflammation around the sciatic nerve (steroids).  Surgery. Follow these instructions at home: Medicines  Take over-the-counter and prescription medicines only as told by your health care provider.  Ask your health care provider if the medicine prescribed to you: ? Requires you to avoid driving or using heavy machinery. ? Can cause constipation. You may need to take these actions to prevent or treat constipation:  Drink enough fluid to keep your urine pale yellow.  Take over-the-counter or prescription medicines.  Eat foods that are high in fiber, such as beans, whole grains, and fresh fruits and vegetables.  Limit foods that are high in fat and processed sugars, such as fried or sweet foods. Managing pain      If directed, put ice on the affected area. ? Put ice in a plastic bag. ? Place a towel between your skin and the bag. ? Leave the ice on for 20 minutes, 2-3 times a day.  If directed, apply heat to the  affected area. Use the heat source that your health care provider recommends, such as a moist heat pack or a heating pad. ? Place a towel between your skin and the heat source. ? Leave the heat on for 20-30 minutes. ? Remove the heat if your skin turns bright red. This is especially important if you are unable to feel pain, heat, or cold. You may have a greater risk of getting burned. Activity   Return to your normal activities as told by your health care  provider. Ask your health care provider what activities are safe for you.  Avoid activities that make your symptoms worse.  Take brief periods of rest throughout the day. ? When you rest for longer periods, mix in some mild activity or stretching between periods of rest. This will help to prevent stiffness and pain. ? Avoid sitting for long periods of time without moving. Get up and move around at least one time each hour.  Exercise and stretch regularly, as told by your health care provider.  Do not lift anything that is heavier than 10 lb (4.5 kg) while you have symptoms of sciatica. When you do not have symptoms, you should still avoid heavy lifting, especially repetitive heavy lifting.  When you lift objects, always use proper lifting technique, which includes: ? Bending your knees. ? Keeping the load close to your body. ? Avoiding twisting. General instructions  Maintain a healthy weight. Excess weight puts extra stress on your back.  Wear supportive, comfortable shoes. Avoid wearing high heels.  Avoid sleeping on a mattress that is too soft or too hard. A mattress that is firm enough to support your back when you sleep may help to reduce your pain.  Keep all follow-up visits as told by your health care provider. This is important. Contact a health care provider if:  You have pain that: ? Wakes you up when you are sleeping. ? Gets worse when you lie down. ? Is worse than you have experienced in the past. ? Lasts longer  than 4 weeks.  You have an unexplained weight loss. Get help right away if:  You are not able to control when you urinate or have bowel movements (incontinence).  You have: ? Weakness in your lower back, pelvis, buttocks, or legs that gets worse. ? Redness or swelling of your back. ? A burning sensation when you urinate. Summary  Sciatica is pain, numbness, weakness, or tingling along the path of the sciatic nerve.  This condition is caused by pressure on the sciatic nerve or pinching of the nerve.  Sciatica can cause pain, numbness, or tingling in the lower back, legs, hips, and buttocks.  Treatment often includes rest, exercise, medicines, and applying ice or heat. This information is not intended to replace advice given to you by your health care provider. Make sure you discuss any questions you have with your health care provider. Document Revised: 06/13/2018 Document Reviewed: 06/13/2018 Elsevier Patient Education  2020 [employer].

## 2019-07-07 NOTE — MAU Note (Signed)
Unable to maintain FHR tracing due to pt's discomfort, habitus, and pt needing to sit up in bed due to discomfort in lower back and leg

## 2019-07-07 NOTE — Progress Notes (Signed)
Baby active. Have tried from 0310 until 0355 moving transducer. Pt helping with holding the transducer.

## 2019-07-07 NOTE — MAU Provider Note (Signed)
Chief Complaint:  No chief complaint on file.   First Provider Initiated Contact with Patient 07/07/19 [phone]      HPI: DANNELL RACZKOWSKI is a 24 y.o. G1P0000 at 9w3dwho presents to maternity admissions reporting severe right lower back pain which occurred suddenly and spontaneously while lying in bed reading.  States it caused numbness down entire right leg. Chart review shows a similar complaint when I saw her in December. Pain is sharp and occurs in one particular spot at [facility] joint area.  Comes and goes suddenly. . She reports good fetal movement, denies LOF, vaginal bleeding, vaginal itching/burning, urinary symptoms, h/a, dizziness, n/v, diarrhea, constipation or fever/chills.  She denies headache, visual changes or RUQ abdominal pain.   Past Medical History: Past Medical History:  Diagnosis Date  . Acute lateral meniscus tear of left knee 02/02/2014  . Acute medial meniscus tear of left knee   . Concussion with brief (less than one hour) loss of consciousness 4-28 15  . Depression   . Hypertension   . Nephrolithiasis     Past obstetric history: OB History  Gravida Para Term Preterm AB Living  1 0 0 0 0 0  SAB TAB Ectopic Multiple Live Births  0 0 0 0      # Outcome Date GA Lbr Len/2nd Weight Sex Delivery Anes PTL Lv  1 Current             Past Surgical History: Past Surgical History:  Procedure Laterality Date  . CHOLECYSTECTOMY N/A 04/10/2016   Procedure: LAPAROSCOPIC CHOLECYSTECTOMY;  Surgeon: Rodman Pickle, MD;  Location: [facility];  Service: General;  Laterality: N/A;  . KNEE ARTHROSCOPY WITH LATERAL MENISECTOMY Left 02/02/2014   Procedure: LEFT KNEE ARTHROSCOPY WITH LATERAL MENISECTOMY;  Surgeon: Eulas Post, MD;  Location: [facility];  Service: Orthopedics;  Laterality: Left;  . TONSILLECTOMY      Family History: Family History  Problem Relation Age of Onset  . Breast cancer Paternal Grandmother   . Healthy Mother   . Atrial fibrillation Father    . Prostate cancer Paternal Grandfather   . Cancer Paternal Grandfather        liver  . Migraines Maternal Uncle   . COPD Maternal Uncle     Social History: Social History   Tobacco Use  . Smoking status: Never Smoker  . Smokeless tobacco: Never Used  Substance Use Topics  . Alcohol use: No  . Drug use: No    Allergies: No Known Allergies  Meds:  Medications Prior to Admission  Medication Sig Dispense Refill Last Dose  . aspirin EC 81 MG tablet Take 2 tablets (162 mg total) by mouth daily. 60 tablet 6 07/06/2019 at Unknown time  . labetalol (NORMODYNE) 200 MG tablet Take 1 tablet (200 mg total) by mouth 2 (two) times daily. 60 tablet 3 07/06/2019 at Unknown time  . Prenatal Vit-Fe Fumarate-FA (PRENATAL VITAMIN PO) Take by mouth daily.   07/06/2019 at Unknown time  . traMADol (ULTRAM) 50 MG tablet Take 1 tablet (50 mg total) by mouth every 6 (six) hours as needed for moderate pain. 10 tablet 0     I have reviewed patient's Past Medical Hx, Surgical Hx, Family Hx, Social Hx, medications and allergies.   ROS:  Review of Systems  Constitutional: Negative for activity change, chills and fever.  HENT: Positive for sore throat.   Respiratory: Positive for cough. Negative for shortness of breath.   Gastrointestinal: Negative for abdominal pain,  constipation, diarrhea and nausea.  Genitourinary: Negative for difficulty urinating, dysuria, flank pain, vaginal bleeding and vaginal discharge.  Musculoskeletal: Positive for back pain.  Neurological: Positive for numbness (right leg, transient). Negative for dizziness and weakness.   Other systems negative  Physical Exam   Patient Vitals for the past 24 hrs:  BP Temp Pulse Resp SpO2 Height  07/07/19 0414 (!) 143/75 -- (!) 127 -- -- --  07/07/19 0358 -- -- -- -- 99 % --  07/07/19 0344 -- -- -- (!) 36 -- --  07/07/19 0328 138/64 -- (!) 146 -- -- --  07/07/19 0311 -- 99 F (37.2 C) -- -- -- 5\' 8"  (1.727 m)   Constitutional:  Well-developed, well-nourished female in no acute distress.  Cardiovascular: normal rate and rhythm Respiratory: normal effort, clear to auscultation bilaterally GI: Abd soft, non-tender, gravid appropriate for gestational age.   No rebound or guarding. MS: Extremities nontender, no edema, normal ROM   Tender over right sciatic joint.  Normal movement of leg Neurologic: Alert and oriented x 4.  GU: Neg CVAT.  PELVIC EXAM:  Dilation: Closed Effacement (%): Thick Station: Ballotable Exam by:: [email or web address] CNM  FHT:  Baseline 170 , moderate variability, difficult to trace due to fetal activity and maternal habitus. Contractions: Rare   Labs: Results for orders placed or performed during the hospital encounter of 07/07/19 (from the past 24 hour(s))  Urinalysis, Routine w reflex microscopic     Status: Abnormal   Collection Time: 07/07/19  3:00 AM  Result Value Ref Range   Color, Urine YELLOW YELLOW   APPearance HAZY (A) CLEAR   Specific Gravity, Urine 1.009 1.005 - 1.030   pH 6.0 5.0 - 8.0   Glucose, UA NEGATIVE NEGATIVE mg/dL   Hgb urine dipstick SMALL (A) NEGATIVE   Bilirubin Urine NEGATIVE NEGATIVE   Ketones, ur NEGATIVE NEGATIVE mg/dL   Protein, ur NEGATIVE NEGATIVE mg/dL   Nitrite NEGATIVE NEGATIVE   Leukocytes,Ua TRACE (A) NEGATIVE   RBC / HPF 0-5 0 - 5 RBC/hpf   WBC, UA 0-5 0 - 5 WBC/hpf   Bacteria, UA RARE (A) NONE SEEN   Squamous Epithelial / LPF 0-5 0 - 5   Mucus PRESENT   Protein / creatinine ratio, urine     Status: None   Collection Time: 07/07/19  3:00 AM  Result Value Ref Range   Creatinine, Urine 63.57 mg/dL   Total Protein, Urine <6 mg/dL   Protein Creatinine Ratio        0.00 - 0.15 mg/mg[Cre]  CBC     Status: None   Collection Time: 07/07/19  4:32 AM  Result Value Ref Range   WBC 9.7 4.0 - 10.5 K/uL   RBC 4.55 3.87 - 5.11 MIL/uL   Hemoglobin 12.2 12.0 - 15.0 g/dL   HCT 07/09/19 20.8 - 02.2 %   MCV 82.4 80.0 - 100.0 fL   MCH 26.8 26.0 - 34.0 pg   MCHC  32.5 30.0 - 36.0 g/dL   RDW 33.6 12.2 - 44.9 %   Platelets 267 150 - 400 K/uL   nRBC 0.0 0.0 - 0.2 %  Comprehensive metabolic panel     Status: Abnormal   Collection Time: 07/07/19  4:32 AM  Result Value Ref Range   Sodium 133 (L) 135 - 145 mmol/L   Potassium 3.9 3.5 - 5.1 mmol/L   Chloride 104 98 - 111 mmol/L   CO2 18 (L) 22 - 32 mmol/L   Glucose, Bld 96  70 - 99 mg/dL   BUN 5 (L) 6 - 20 mg/dL   Creatinine, Ser 0.57 0.44 - 1.00 mg/dL   Calcium 9.3 8.9 - 10.3 mg/dL   Total Protein 6.7 6.5 - 8.1 g/dL   Albumin 2.9 (L) 3.5 - 5.0 g/dL   AST 23 15 - 41 U/L   ALT 24 0 - 44 U/L   Alkaline Phosphatase 81 38 - 126 U/L   Total Bilirubin 0.1 (L) 0.3 - 1.2 mg/dL   GFR calc non Af Amer >60 >60 mL/min   GFR calc Af Amer >60 >60 mL/min   Anion gap 11 5 - 15    A/Positive/-- (09/14 1046)  Imaging:    MAU Course/MDM: I have ordered labs and reviewed results. Labs are normal   Pt has chronic HTN, no preeclampsia NST reviewed, reactive.  Fetal tachy initially which settled down over time.   Patient given Flexeril and analgesic with good relief    Assessment: Single IUP at [redacted]w[redacted]d Sciatic pain Right paresthesia, resolved Chronic Hypertension with no evidence of preeclampsia  Plan: Discharge home Rx Flexeril for home use for spasm Will likely need PT when able Labor precautions and fetal kick counts Follow up in Office for prenatal visits   Encouraged to return here or to other Urgent Care/ED if she develops worsening of symptoms, increase in pain, fever, or other concerning symptoms.   Pt stable at time of discharge.  Hansel Feinstein CNM, MSN Certified Nurse-Midwife 07/07/2019 4:15 AM

## 2019-07-07 NOTE — MAU Note (Signed)
Pt up to BR

## 2019-07-07 NOTE — Telephone Encounter (Signed)
Called patient to discuss Covid Care questionnaire- increased weakness noted today. Patient is able to eat and drink and is able to rest at home.  She has seen her provider today for her 33 week pre-natal check-up and reports a sore throat, a cough, achiness, fatigue and a runny nose.  Her symptoms began January 21 and she reports that her COVID test has come back positive.   Please continue to monitor your symptoms and follow the care plan that has been recommended by your doctor. Please contact your provider if your symptoms worsen.

## 2019-07-07 NOTE — MAU Note (Addendum)
Having lower back pain since last night. Pain is sharp then dulls and goes away. When back pain happens R leg feels like pins in it and then gets numb. Denies any pregnancy concerns. PT's husband tested positive for Covid on Weds. Pt has cough and sore throat. Sometimes SOB but only when pain comes.

## 2019-07-07 NOTE — Progress Notes (Signed)
PT moves often trying to get comfortable making tracing FHR and ctxs extremely difficult

## 2019-07-08 ENCOUNTER — Encounter (INDEPENDENT_AMBULATORY_CARE_PROVIDER_SITE_OTHER): Payer: Self-pay

## 2019-07-09 ENCOUNTER — Encounter (INDEPENDENT_AMBULATORY_CARE_PROVIDER_SITE_OTHER): Payer: Self-pay

## 2019-07-10 ENCOUNTER — Encounter (INDEPENDENT_AMBULATORY_CARE_PROVIDER_SITE_OTHER): Payer: Self-pay

## 2019-07-10 ENCOUNTER — Other Ambulatory Visit: Payer: 59

## 2019-07-10 ENCOUNTER — Encounter: Payer: 59 | Admitting: Obstetrics & Gynecology

## 2019-07-11 ENCOUNTER — Encounter (INDEPENDENT_AMBULATORY_CARE_PROVIDER_SITE_OTHER): Payer: Self-pay

## 2019-07-12 ENCOUNTER — Encounter (INDEPENDENT_AMBULATORY_CARE_PROVIDER_SITE_OTHER): Payer: Self-pay

## 2019-07-13 ENCOUNTER — Other Ambulatory Visit: Payer: 59

## 2019-07-13 ENCOUNTER — Ambulatory Visit (INDEPENDENT_AMBULATORY_CARE_PROVIDER_SITE_OTHER): Payer: 59 | Admitting: Advanced Practice Midwife

## 2019-07-13 ENCOUNTER — Encounter: Payer: Self-pay | Admitting: Medical

## 2019-07-13 ENCOUNTER — Other Ambulatory Visit: Payer: Self-pay

## 2019-07-13 ENCOUNTER — Ambulatory Visit (INDEPENDENT_AMBULATORY_CARE_PROVIDER_SITE_OTHER): Payer: 59

## 2019-07-13 VITALS — BP 138/76 | HR 128 | Wt 328.4 lb

## 2019-07-13 DIAGNOSIS — O0990 Supervision of high risk pregnancy, unspecified, unspecified trimester: Secondary | ICD-10-CM

## 2019-07-13 DIAGNOSIS — Z3A33 33 weeks gestation of pregnancy: Secondary | ICD-10-CM

## 2019-07-13 DIAGNOSIS — O10013 Pre-existing essential hypertension complicating pregnancy, third trimester: Secondary | ICD-10-CM

## 2019-07-13 DIAGNOSIS — O10913 Unspecified pre-existing hypertension complicating pregnancy, third trimester: Secondary | ICD-10-CM

## 2019-07-13 DIAGNOSIS — O10919 Unspecified pre-existing hypertension complicating pregnancy, unspecified trimester: Secondary | ICD-10-CM

## 2019-07-13 DIAGNOSIS — Z1389 Encounter for screening for other disorder: Secondary | ICD-10-CM

## 2019-07-13 DIAGNOSIS — Z331 Pregnant state, incidental: Secondary | ICD-10-CM

## 2019-07-13 DIAGNOSIS — U071 COVID-19: Secondary | ICD-10-CM | POA: Insufficient documentation

## 2019-07-13 MED ORDER — ONDANSETRON 4 MG PO TBDP: 4.0000 mg | ORAL_TABLET | Freq: Four times a day (QID) | ORAL | 2 refills | Status: DC | PRN

## 2019-07-13 NOTE — Progress Notes (Signed)
HIGH-RISK PREGNANCY VISIT Patient name: Rebecca Gardner MRN [id number]  Date of birth: Jul 13, 1995 Chief Complaint:   Routine Prenatal Visit  History of Present Illness:   Rebecca Gardner is a 24 y.o. G15P0000 female at [redacted]w[redacted]d with an Estimated Date of Delivery: 08/29/19 being seen today for ongoing management of a high-risk pregnancy (CHTN on meds). She tested + for C-19 1 week ago, sx started with a sore throat 10 days ago, it went away after a few hours and reappeared 7 days ago.  Says was released to go back to work via [employer], states that she told them that she was still symptomatic, and is supposed to work Sat and Sunday. Front office states that pt answered "no" to all of our screening questions last week and this week.   Today she reports still coughing, vomiting (started yesterday) feeling weak.  . Contractions: Not present. Vag. Bleeding: None.  Movement: Present. denies leaking of fluid. Review of Systems:   Pertinent items are noted in HPI Denies abnormal vaginal discharge w/ itching/odor/irritation, headaches, visual changes, shortness of breath, chest pain, abdominal pain, severe nausea/vomiting, or problems with urination or bowel movements unless otherwise stated above. Pertinent History Reviewed:  Reviewed past medical,surgical, social, obstetrical and family history.  Reviewed problem list, medications and allergies. Physical Assessment:   Vitals:   07/13/19 0934  BP: 138/76  Pulse: (!) 128  Weight: (!) 328 lb 6.4 oz (149 kg)  Body mass index is 49.93 kg/m.        Physical Examination:   General appearance: Ill- appearing, and in no distress  Mental status: Alert, oriented to person, place, and time  Skin: Warm & dry  Cardiovascular: Normal heart rate noted  Respiratory: Normal respiratory effort, no distress.  Coughing a lot,  O2 sat 98%  Abdomen: Soft, gravid, nontender.  Vomiting in exam room  Pelvic: Cervical exam deferred         Extremities: Edema:  Trace  Fetal Status:     Movement: Present    Chaperone: n/a    No results found for this or any previous visit (from the past 24 hour(s)).  Assessment & Plan:  1) High-risk pregnancy G1P0000 at [redacted]w[redacted]d with an Estimated Date of Delivery: 08/29/19   2) Covid + 07/06/19, first symptoms 07/02/19 still symptomatic,  Note to be OOW this weekend d/t still feeling so poorly, contagiousness notwithstanding.    Meds:  Meds ordered this encounter  Medications  . ondansetron (ZOFRAN ODT) 4 MG disintegrating tablet    Sig: Take 1 tablet (4 mg total) by mouth every 6 (six) hours as needed for nausea.    Dispense:  30 tablet    Refill:  2    Order Specific Question:   Supervising Provider    Answer:   Duane Lope H [[id number]]   Labs/procedures today: [place] 33+2 wks,cephalic,BPP 8/8,FHR 132 BPM,anterior placenta gr 3,afi 14.5 cm,RI .67,.58,.57,.69=53%  Plan: twice weekly testing, continue present meds.  Next visit: prefers will be in person for NST    Reviewed: Preterm labor symptoms and general obstetric precautions including but not limited to vaginal bleeding, contractions, leaking of fluid and fetal movement were reviewed in detail with the patient.  All questions were answered. Has home bp cuff  Continue to check daily. Check bp weekly, let us know if >150/100  Follow-up: Return for q monday for NST w/RN and q thursday BPP/HROB appt.  Orders Placed This Encounter  Procedures  . POC Urinalysis Dipstick OB  Christin Fudge DNP, CNM 07/13/2019 10:09 AM

## 2019-07-13 NOTE — Progress Notes (Signed)
[place] 33+2 wks,cephalic,BPP 8/8,FHR 132 BPM,anterior placenta gr 3,afi 14.5 cm,RI .67,.58,.57,.69=53%

## 2019-07-13 NOTE — Patient Instructions (Signed)
[name], I greatly value your feedback.  If you receive a survey following your visit with [place] today, we appreciate you taking the time to fill it out.  Thanks, Cathie Beams, DNP, CNM  [facility]OVED!!! It is now [facility] ([address] [place], [place] [address]) Entrance located off of [address] Free 24/7 valet parking   Go to [email or web address] to register for FREE online childbirth classes    Call the office [phone]) or go to [facility] if:  You begin to have strong, frequent contractions  Your water breaks.  Sometimes it is a big gush of fluid, sometimes it is just a trickle that keeps getting your panties wet or running down your legs  You have vaginal bleeding.  It is normal to have a small amount of spotting if your cervix was checked.   You don't feel your baby moving like normal.  If you don't, get you something to eat and drink and lay down and focus on feeling your baby move.  You should feel at least 10 movements in 2 hours.  If you don't, you should call the office or go to [facility].   Home Blood Pressure Monitoring for Patients   Your provider has recommended that you check your blood pressure (BP) at least once a week at home. If you do not have a blood pressure cuff at home, one will be provided for you. Contact your provider if you have not received your monitor within 1 week.   Helpful Tips for Accurate Home Blood Pressure Checks  . Don't smoke, exercise, or drink caffeine 30 minutes before checking your BP . Use the restroom before checking your BP (a full bladder can raise your pressure) . Relax in a comfortable upright chair . Feet on the ground . Left arm resting comfortably on a flat surface at the level of your heart . Legs uncrossed . Back supported . Sit quietly and don't talk . Place the cuff on your bare arm . Adjust snuggly, so that only two fingertips can fit  between your skin and the top of the cuff . Check 2 readings separated by at least one minute . Keep a log of your BP readings . For a visual, please reference this diagram: http://ccnc.care/bpdiagram  Provider Name: [facility]     Phone: [phone]  Zone 1: ALL CLEAR  Continue to monitor your symptoms:  . BP reading is less than 140 (top number) or less than 90 (bottom number)  . No right upper stomach pain . No headaches or seeing spots . No feeling nauseated or throwing up . No swelling in face and hands  Zone 2: CAUTION Call your doctor's office for any of the following:  . BP reading is greater than 140 (top number) or greater than 90 (bottom number)  . Stomach pain under your ribs in the middle or right side . Headaches or seeing spots . Feeling nauseated or throwing up . Swelling in face and hands  Zone 3: EMERGENCY  Seek immediate medical care if you have any of the following:  . BP reading is greater than160 (top number) or greater than 110 (bottom number) . Severe headaches not improving with Tylenol . Serious difficulty catching your breath . Any worsening symptoms from Zone 2

## 2019-07-17 ENCOUNTER — Encounter: Payer: Self-pay | Admitting: *Deleted

## 2019-07-17 ENCOUNTER — Other Ambulatory Visit: Payer: 59

## 2019-07-17 ENCOUNTER — Telehealth: Payer: Self-pay | Admitting: *Deleted

## 2019-07-17 ENCOUNTER — Telehealth: Payer: Self-pay | Admitting: Obstetrics & Gynecology

## 2019-07-17 MED ORDER — OMEPRAZOLE 20 MG PO CPDR: 20.0000 mg | DELAYED_RELEASE_CAPSULE | Freq: Every day | ORAL | 6 refills | Status: DC

## 2019-07-17 NOTE — Telephone Encounter (Signed)
She is still having severe nausea/vomiting and coughing.  She wants to try a different nausea medication please.

## 2019-07-17 NOTE — Telephone Encounter (Signed)
Is she having GERD symptoms because at this point most worsening nausea and vomiting is actually from reflux and pt would do well to add omeperazole

## 2019-07-20 ENCOUNTER — Ambulatory Visit (INDEPENDENT_AMBULATORY_CARE_PROVIDER_SITE_OTHER): Payer: 59

## 2019-07-20 ENCOUNTER — Ambulatory Visit (INDEPENDENT_AMBULATORY_CARE_PROVIDER_SITE_OTHER): Payer: 59 | Admitting: Advanced Practice Midwife

## 2019-07-20 ENCOUNTER — Observation Stay (HOSPITAL_BASED_OUTPATIENT_CLINIC_OR_DEPARTMENT_OTHER): Payer: 59

## 2019-07-20 ENCOUNTER — Observation Stay (HOSPITAL_COMMUNITY)
Admission: AD | Admit: 2019-07-20 | Discharge: 2019-07-20 | Disposition: A | Discharge status: Home / Self Care | Payer: 59 | Attending: Obstetrics and Gynecology | Admitting: Obstetrics and Gynecology

## 2019-07-20 ENCOUNTER — Other Ambulatory Visit: Payer: Self-pay

## 2019-07-20 ENCOUNTER — Encounter (HOSPITAL_COMMUNITY): Payer: Self-pay | Admitting: Obstetrics and Gynecology

## 2019-07-20 DIAGNOSIS — O10013 Pre-existing essential hypertension complicating pregnancy, third trimester: Secondary | ICD-10-CM

## 2019-07-20 DIAGNOSIS — O368390 Maternal care for abnormalities of the fetal heart rate or rhythm, unspecified trimester, not applicable or unspecified: Secondary | ICD-10-CM

## 2019-07-20 DIAGNOSIS — O99213 Obesity complicating pregnancy, third trimester: Secondary | ICD-10-CM

## 2019-07-20 DIAGNOSIS — O10919 Unspecified pre-existing hypertension complicating pregnancy, unspecified trimester: Secondary | ICD-10-CM

## 2019-07-20 DIAGNOSIS — Z3A34 34 weeks gestation of pregnancy: Secondary | ICD-10-CM

## 2019-07-20 DIAGNOSIS — O368130 Decreased fetal movements, third trimester, not applicable or unspecified: Secondary | ICD-10-CM | POA: Diagnosis not present

## 2019-07-20 DIAGNOSIS — I10 Essential (primary) hypertension: Secondary | ICD-10-CM | POA: Insufficient documentation

## 2019-07-20 DIAGNOSIS — O368330 Maternal care for abnormalities of the fetal heart rate or rhythm, third trimester, not applicable or unspecified: Secondary | ICD-10-CM | POA: Diagnosis not present

## 2019-07-20 DIAGNOSIS — O99891 Other specified diseases and conditions complicating pregnancy: Secondary | ICD-10-CM | POA: Insufficient documentation

## 2019-07-20 DIAGNOSIS — Z7982 Long term (current) use of aspirin: Secondary | ICD-10-CM | POA: Insufficient documentation

## 2019-07-20 DIAGNOSIS — O0990 Supervision of high risk pregnancy, unspecified, unspecified trimester: Secondary | ICD-10-CM

## 2019-07-20 DIAGNOSIS — O99713 Diseases of the skin and subcutaneous tissue complicating pregnancy, third trimester: Secondary | ICD-10-CM | POA: Diagnosis not present

## 2019-07-20 DIAGNOSIS — L03316 Cellulitis of umbilicus: Secondary | ICD-10-CM | POA: Insufficient documentation

## 2019-07-20 DIAGNOSIS — O288 Other abnormal findings on antenatal screening of mother: Secondary | ICD-10-CM | POA: Diagnosis not present

## 2019-07-20 DIAGNOSIS — O10913 Unspecified pre-existing hypertension complicating pregnancy, third trimester: Secondary | ICD-10-CM | POA: Diagnosis not present

## 2019-07-20 LAB — TYPE AND SCREEN
ABO/RH(D): A POS
Antibody Screen: NEGATIVE

## 2019-07-20 LAB — URINALYSIS, ROUTINE W REFLEX MICROSCOPIC
Bilirubin Urine: NEGATIVE
Glucose, UA: NEGATIVE mg/dL
Ketones, ur: NEGATIVE mg/dL
Nitrite: NEGATIVE
Protein, ur: 30 mg/dL — AB
Specific Gravity, Urine: 1.02 (ref 1.005–1.030)
pH: 5 (ref 5.0–8.0)

## 2019-07-20 LAB — ABO/RH: ABO/RH(D): A POS

## 2019-07-20 MED ORDER — PRENATAL MULTIVITAMIN CH: 1.0000 | ORAL_TABLET | Freq: Every day | ORAL | Status: DC

## 2019-07-20 MED ORDER — ACETAMINOPHEN 325 MG PO TABS: 650.0000 mg | ORAL_TABLET | ORAL | Status: DC | PRN

## 2019-07-20 MED ORDER — DOCUSATE SODIUM 100 MG PO CAPS
100.0000 mg | ORAL_CAPSULE | Freq: Every day | ORAL | Status: DC
  Administered 2019-07-20: 15:00:00 100 mg via ORAL
  Filled 2019-07-20: qty 1

## 2019-07-20 MED ORDER — ONDANSETRON HCL 4 MG PO TABS: 8.0000 mg | ORAL_TABLET | Freq: Three times a day (TID) | ORAL | Status: DC | PRN

## 2019-07-20 MED ORDER — SODIUM CHLORIDE 0.9% FLUSH: 3.0000 mL | INTRAVENOUS | Status: DC | PRN

## 2019-07-20 MED ORDER — PANTOPRAZOLE SODIUM 40 MG PO TBEC
40.0000 mg | DELAYED_RELEASE_TABLET | Freq: Every day | ORAL | Status: DC
  Administered 2019-07-20: 40 mg via ORAL
  Filled 2019-07-20: qty 1

## 2019-07-20 MED ORDER — CEPHALEXIN 500 MG PO CAPS: 500.0000 mg | ORAL_CAPSULE | Freq: Four times a day (QID) | ORAL | 0 refills | Status: AC

## 2019-07-20 MED ORDER — DOCUSATE SODIUM 100 MG PO CAPS: 100.0000 mg | ORAL_CAPSULE | Freq: Two times a day (BID) | ORAL | Status: DC | PRN

## 2019-07-20 MED ORDER — SODIUM CHLORIDE 0.9% FLUSH: 3.0000 mL | Freq: Two times a day (BID) | INTRAVENOUS | Status: DC

## 2019-07-20 MED ORDER — CEPHALEXIN 500 MG PO CAPS
500.0000 mg | ORAL_CAPSULE | Freq: Four times a day (QID) | ORAL | Status: DC
  Administered 2019-07-20: 500 mg via ORAL
  Filled 2019-07-20: qty 1

## 2019-07-20 MED ORDER — LABETALOL HCL 200 MG PO TABS
200.0000 mg | ORAL_TABLET | Freq: Two times a day (BID) | ORAL | Status: DC
  Administered 2019-07-20: 200 mg via ORAL
  Filled 2019-07-20: qty 1

## 2019-07-20 MED ORDER — CALCIUM CARBONATE ANTACID 500 MG PO CHEW: 2.0000 | CHEWABLE_TABLET | ORAL | Status: DC | PRN

## 2019-07-20 MED ORDER — SODIUM CHLORIDE 0.9 % IV SOLN: 250.0000 mL | INTRAVENOUS | Status: DC | PRN

## 2019-07-20 MED ORDER — ZOLPIDEM TARTRATE 5 MG PO TABS: 5.0000 mg | ORAL_TABLET | Freq: Every evening | ORAL | Status: DC | PRN

## 2019-07-20 MED ORDER — ASPIRIN EC 81 MG PO TBEC: 162.0000 mg | DELAYED_RELEASE_TABLET | Freq: Every day | ORAL | Status: DC

## 2019-07-20 NOTE — Progress Notes (Signed)
Discharge instructions given and reviewed with pt with verbalization of understanding. Pt to private vehicle accompanied by RN and her mother. Pt d/c'd to home in stable condition.

## 2019-07-20 NOTE — H&P (Signed)
FACULTY PRACTICE ANTEPARTUM ADMISSION HISTORY AND PHYSICAL NOTE   History of Present Illness: Rebecca Gardner is a 24 y.o. G1P0000 at [redacted]w[redacted]d admitted for abnormal biophysical profile. Patient had a BPP today due to chronic hypertension. Scored 4/8, off for movement & tone. Sent to MAU for fetal monitoring & had a reactive NST. Reports good fetal movement. No other symptoms.  Patient & her spouse tested positive for COVID on 1/27. They have been symptomatic for a week & released to work.  Patient reports the fetal movement as active. Patient reports uterine contraction  activity as none. Patient reports  vaginal bleeding as none. Patient describes fluid per vagina as None. Fetal presentation is transverse per ultrasound today.  Patient Active Problem List   Diagnosis Date Noted  . Decreased fetus movements affecting management of mother in third trimester 07/20/2019  . COVID-19 07/13/2019  . Supraventricular tachycardia during pregnancy (HCC) 07/05/2019  . Benign hypertension 05/22/2019  . Supervision of high risk pregnancy, antepartum 02/20/2019  . Right ovarian cyst 02/20/2019  . Gall stones 03/27/2016  . 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 Depression with anxiety 02/05/2016  . Cephalalgia 02/05/2016  . Obesity 07/30/2015  . Acute lateral meniscus tear of left knee 02/02/2014    Past Medical History:  Diagnosis Date  . Acute lateral meniscus tear of left knee 02/02/2014  . Acute medial meniscus tear of left knee   . Concussion with brief (less than one hour) loss of consciousness 4-28 15  . Depression   . Hypertension   . Nephrolithiasis     Past Surgical History:  Procedure Laterality Date  . CHOLECYSTECTOMY N/A 04/10/2016   Procedure: LAPAROSCOPIC CHOLECYSTECTOMY;  Surgeon: Rodman Pickle, MD;  Location: [facility] OR;  Service: General;  Laterality: N/A;  . KNEE ARTHROSCOPY WITH LATERAL MENISECTOMY Left 02/02/2014   Procedure: LEFT KNEE ARTHROSCOPY WITH LATERAL MENISECTOMY;  Surgeon:  Eulas Post, MD;  Location: [facility];  Service: Orthopedics;  Laterality: Left;  . TONSILLECTOMY      OB History  Gravida Para Term Preterm AB Living  1 0 0 0 0 0  SAB TAB Ectopic Multiple Live Births  0 0 0 0      # Outcome Date GA Lbr Len/2nd Weight Sex Delivery Anes PTL Lv  1 Current             Social History   Socioeconomic History  . Marital status: Single    Spouse name: Not on file  . Number of children: 0  . Years of education: college  . Highest education level: Associate degree: occupational, Scientist, product/process development, or vocational program  Occupational History  . Occupation: Lawyer  Tobacco Use  . Smoking status: Never Smoker  . Smokeless tobacco: Never Used  Substance and Sexual Activity  . Alcohol use: No  . Drug use: No  . Sexual activity: Yes    Birth control/protection: Condom  Other Topics Concern  . Not on file  Social History Narrative  . Not on file   Social Determinants of Health   Financial Resource Strain: Low Risk   . Difficulty of Paying Living Expenses: Not hard at all  Food Insecurity: No Food Insecurity  . Worried About Programme researcher, broadcasting/film/video in the Last Year: Never true  . Ran Out of Food in the Last Year: Never true  Transportation Needs: No Transportation Needs  . Lack of Transportation (Medical): No  . Lack of Transportation (Non-Medical): No  Physical Activity: Insufficiently Active  .  Days of Exercise per Week: 4 days  . Minutes of Exercise per Session: 30 min  Stress: No Stress Concern Present  . Feeling of Stress : Not at all  Social Connections: Slightly Isolated  . Frequency of Communication with Friends and Family: Three times a week  . Frequency of Social Gatherings with Friends and Family: Three times a week  . Attends Religious Services: 1 to 4 times per year  . Active Member of Clubs or Organizations: No  . Attends Archivist Meetings: Never  . Marital Status: Living with partner     Family History  Problem Relation Age of Onset  . Breast cancer Paternal Grandmother   . Healthy Mother   . Atrial fibrillation Father   . Prostate cancer Paternal Grandfather   . Cancer Paternal Grandfather        liver  . Migraines Maternal Uncle   . COPD Maternal Uncle     No Known Allergies  Medications Prior to Admission  Medication Sig Dispense Refill Last Dose  . aspirin EC 81 MG tablet Take 2 tablets (162 mg total) by mouth daily. 60 tablet 6 07/20/2019 at Unknown time  . cyclobenzaprine (FLEXERIL) 10 MG tablet Take 1 tablet (10 mg total) by mouth every 8 (eight) hours as needed for muscle spasms. 30 tablet 2 Past Week at Unknown time  . guaiFENesin (MUCINEX) 600 MG 12 hr tablet Take 1 tablet (600 mg total) by mouth 2 (two) times daily. 30 tablet 2 Past Week at Unknown time  . labetalol (NORMODYNE) 200 MG tablet Take 1 tablet (200 mg total) by mouth 2 (two) times daily. 60 tablet 3 07/20/2019 at Unknown time  . omeprazole (PRILOSEC) 20 MG capsule Take 1 capsule (20 mg total) by mouth daily. 1 tablet a day 30 capsule 6 Past Week at Unknown time  . Prenatal Vit-Fe Fumarate-FA (PRENATAL VITAMIN PO) Take by mouth daily.   07/20/2019 at Unknown time  . ondansetron (ZOFRAN ODT) 4 MG disintegrating tablet Take 1 tablet (4 mg total) by mouth every 6 (six) hours as needed for nausea. 30 tablet 2 Unknown at Unknown time  . traMADol (ULTRAM) 50 MG tablet Take 1 tablet (50 mg total) by mouth every 6 (six) hours as needed for moderate pain. 10 tablet 0 Unknown at Unknown time    Review of Systems - Negative. History obtained from patient.   Vitals:  BP 131/89 (BP Location: Left Arm)   Pulse (!) 118   Temp 98.1 F (36.7 C) (Tympanic)   Resp 18   Ht 5\' 8"  (1.727 m)   Wt (!) 149.1 kg   LMP 11/22/2018 (Approximate)   SpO2 99%   BMI 49.99 kg/m  Physical Examination: CONSTITUTIONAL: Well-developed, well-nourished female in no acute distress.  HENT:  Normocephalic, atraumatic,  External right and left ear normal. Oropharynx is clear and moist EYES: Conjunctivae and EOM are normal. Pupils are equal, round, and reactive to light. No scleral icterus.  NECK: Normal range of motion, supple, no masses SKIN: Skin is warm and dry. No rash noted. Not diaphoretic. No erythema. No pallor. [place]: Alert and oriented to person, place, and time. Normal reflexes, muscle tone coordination. No cranial nerve deficit noted. PSYCHIATRIC: Normal mood and affect. Normal behavior. Normal judgment and thought content. CARDIOVASCULAR: Normal heart rate noted, regular rhythm RESPIRATORY: Effort and breath sounds normal, no problems with respiration noted ABDOMEN: Soft, nontender, nondistended, gravid. MUSCULOSKELETAL: Normal range of motion. No edema and no tenderness. 2+ distal pulses.  Cervix:  not examined Membranes:intact Fetal Monitoring:Baseline: 145 bpm, Variability: Good {> 6 bpm), Accelerations: Reactive and Decelerations: Absent Tocometer: Flat  Labs:  Results for orders placed or performed during the hospital encounter of 07/20/19 (from the past 24 hour(s))  Urinalysis, Routine w reflex microscopic   Collection Time: 07/20/19 12:55 PM  Result Value Ref Range   Color, Urine YELLOW YELLOW   APPearance HAZY (A) CLEAR   Specific Gravity, Urine 1.020 1.005 - 1.030   pH 5.0 5.0 - 8.0   Glucose, UA NEGATIVE NEGATIVE mg/dL   Hgb urine dipstick SMALL (A) NEGATIVE   Bilirubin Urine NEGATIVE NEGATIVE   Ketones, ur NEGATIVE NEGATIVE mg/dL   Protein, ur 30 (A) NEGATIVE mg/dL   Nitrite NEGATIVE NEGATIVE   Leukocytes,Ua MODERATE (A) NEGATIVE   RBC / HPF 0-5 0 - 5 RBC/hpf   WBC, UA 0-5 0 - 5 WBC/hpf   Bacteria, UA MANY (A) NONE SEEN   Squamous Epithelial / LPF 0-5 0 - 5   Mucus PRESENT     Imaging Studies: No results found.   Assessment and Plan: 1. Decreased fetal movement, third trimester, not applicable or unspecified fetus   2. [redacted] weeks gestation of pregnancy   3.  Benign hypertension    Observe on OBSC unit until repeat BPP tomorrow morning Continuous fetal monitoring Continue at home meds -labetalol & omeprazole S/w Dr. Mikle Bosworth (neonatologist) regarding admission  Judeth Horn, NP 07/20/2019 1:23 PM

## 2019-07-20 NOTE — Discharge Summary (Signed)
Discharge Summary   Admit Date: 07/20/2019 Discharge Date: 07/20/2019 Discharging Service: Antepartum  Primary OBG[facility]: [facility] Admitting Physician: Sloan Leiter, MD  Discharge Physician: Ilda Basset  Referring Provider: [facility]  Primary Care Provider: Sharion Balloon, FNP  Admission Diagnoses: *Pregnancy at 34/2 *BPP 4/8 (off for tone and breathing in the office) *Reactive non stress test *BMI 30s *CHTN  Discharge Diagnoses: *Same *BPP 37/90 *Periumbilical cellulitis  Consult Orders: None   Surgeries/Procedures Performed: Continuous fetal monitoring Biophysical profile  History and Physical: Patient had routine antepartum surveillance BPP today and 4/8 with reactive NST here in at the hospital for a total of 6/10. Patient admitted with plan for rpt [facility]  Hospital Course: Patient had category I tracing with accels during her stay. Repeat bpp 8/8 with fetal growth 14% @ 2111gm, AC 12%. Patient discharged home. Request sent to FT to add on a bpp to her Tuesday non stress test visit.   Patient noted periumbilical fluid drainage starting today that looks slightly discolored. Exam showed brownish fluid and erythema at the umbilicus and some peau d'orange dimpling. Edema/cellulitis dx and pt given first dose of keflex prior to d/c and more sent to pharmacy.   Discharge Exam:   Current Vital Signs 24h Vital Sign Ranges  T 97.9 F (36.6 C) Temp  Avg: 97.9 F (36.6 C)  Min: 97.7 F (36.5 C)  Max: 98.1 F (36.7 C)  BP 130/69 BP  Min: 128/78  Max: 136/79  HR 91 Pulse  Avg: 107.5  Min: 91  Max: 130  RR 18 Resp  Avg: 18.5  Min: 18  Max: 20  SaO2 100 %   SpO2  Avg: 99.5 %  Min: 99 %  Max: 100 %       24 Hour I/O Current Shift I/O  Time Ins Outs No intake/output data recorded. No intake/output data recorded.    Patient Vitals for the past 24 hrs:  BP Temp Temp src Pulse Resp SpO2 Height Weight  07/20/19 2002 130/69 97.9 F (36.6 C) Oral 91 18 100 % - -  07/20/19 1525  128/78 98 F (36.7 C) Oral 91 18 100 % - -  07/20/19 1312 131/89 98.1 F (36.7 C) Tympanic (!) 118 18 99 % - -  07/20/19 1219 136/79 - - - - - - -  07/20/19 1204 (!) 135/91 97.7 F (36.5 C) Oral (!) 130 20 99 % - -  07/20/19 1156 - - - - - - 5\' 8"  (1.727 m) (!) 149.1 kg    General appearance: Well nourished, well developed female in no acute distress.  Respiratory: Normal respiratory effort Abdomen: obese, no fundal tenderness, gravid. Periumbilical slight drainage expressed that was brownish, slight erythema at the umbilicus, slight peau d'orange dimpling.  Neuro/Psych:  Normal mood and affect.  Skin:  Warm and dry.   Discharge Disposition:  Home  Patient Instructions:  Standard   Results Pending at Discharge:  None  Discharge Medications: Allergies as of 07/20/2019   No Known Allergies     Medication List    TAKE these medications   aspirin EC 81 MG tablet Take 2 tablets (162 mg total) by mouth daily.   cephALEXin 500 MG capsule Commonly known as: KEFLEX Take 1 capsule (500 mg total) by mouth every 6 (six) hours for 6 days.   cyclobenzaprine 10 MG tablet Commonly known as: FLEXERIL Take 1 tablet (10 mg total) by mouth every 8 (eight) hours as needed for muscle spasms.  guaiFENesin 600 MG 12 hr tablet Commonly known as: Mucinex Take 1 tablet (600 mg total) by mouth 2 (two) times daily.   labetalol 200 MG tablet Commonly known as: NORMODYNE Take 1 tablet (200 mg total) by mouth 2 (two) times daily.   omeprazole 20 MG capsule Commonly known as: PRILOSEC Take 1 capsule (20 mg total) by mouth daily. 1 tablet a day   ondansetron 4 MG disintegrating tablet Commonly known as: Zofran ODT Take 1 tablet (4 mg total) by mouth every 6 (six) hours as needed for nausea.   PRENATAL VITAMIN PO Take by mouth daily.   traMADol 50 MG tablet Commonly known as: ULTRAM Take 1 tablet (50 mg total) by mouth every 6 (six) hours as needed for moderate pain.        Future  Appointments  Date Time Provider Department Center  07/25/2019  9:30 AM [facility] NURSE [facility]  07/28/2019  9:30 AM Lazaro Arms, MD [facility]  07/31/2019  9:30 AM [facility] NURSE [facility]  08/03/2019  9:45 AM [facility] [place] [facility] None  08/03/2019 10:50 AM Jacklyn Shell, CNM [facility]  08/07/2019  9:50 AM [facility] NURSE [facility]  08/10/2019  1:30 PM [facility] [place] [facility] None  08/10/2019  2:30 PM Cheral Marker, CNM [facility]  08/14/2019  9:30 AM [facility] NURSE [facility]  08/17/2019  9:00 AM [facility] [place] [facility] None  08/17/2019  9:50 AM Jacklyn Shell, CNM [facility]  08/22/2019  9:50 AM [facility] NURSE [facility]  08/25/2019  9:30 AM [facility] [place] [facility] None  08/25/2019 10:30 AM Cheral Marker, CNM [facility]    Cornelia Copa MD Attending Center for [facility])

## 2019-07-20 NOTE — MAU Note (Signed)
Pt sent from [facility] office for fetal monitoring, reports baby wasn't moving during BPP.  Reports hasn't felt any FM since leaving MD office.  Reports ate breakfast this morning & drank tea on the way here.

## 2019-07-20 NOTE — Progress Notes (Signed)
[place] 34+2 wks,transverse head left,anterior placenta gr 3,AFI 17 cm,BPP 4/8 (no movement or tone),cx 3.9 cm,RI .65,.64,.61=70%,discussed results with Drenda Freeze

## 2019-07-20 NOTE — Progress Notes (Signed)
   HIGH-RISK PREGNANCY VISIT Patient name: Rebecca Gardner MRN [id number]  Date of birth: December 29, 1995 Chief Complaint:   No chief complaint on file.  History of Present Illness:   Rebecca Gardner is a 24 y.o. G38P0000 female at [redacted]w[redacted]d with an Estimated Date of Delivery: 08/29/19 being seen today for ongoing management of a high-risk pregnancy complicated by [facility] currently on labetalol Today she reports no complaints.  .  .   . denies leaking of fluid.  Review of Systems:   Pertinent items are noted in HPI Denies abnormal vaginal discharge w/ itching/odor/irritation, headaches, visual changes, shortness of breath, chest pain, abdominal pain, severe nausea/vomiting, or problems with urination or bowel movements unless otherwise stated above. Pertinent History Reviewed:  Reviewed past medical,surgical, social, obstetrical and family history.  Reviewed problem list, medications and allergies. Physical Assessment:  There were no vitals filed for this visit.There is no height or weight on file to calculate BMI.           Physical Examination:   General appearance: alert, well appearing, and in no distress  Mental status: alert, oriented to person, place, and time  Skin: warm & dry   Extremities:      Cardiovascular: normal heart rate noted  Respiratory: normal respiratory effort, no distress  Abdomen: gravid, soft, non-tender  Pelvic: Cervical exam deferred         Fetal Status:          Fetal Surveillance Testing today: [place] 34+2 wks,transverse head left,anterior placenta gr 3,AFI 17 cm,BPP 4/8 (no movement or tone),cx 3.9 cm,RI .65,.64,.61=70%,   No results found for this or any previous visit (from the past 24 hour(s)).  Assessment & Plan:  1) High-risk pregnancy G1P0000 at [redacted]w[redacted]d with an Estimated Date of Delivery: 08/29/19   2) CHTN, stable Treatment plan   Growth [place] 36wks     2x/wk testing nst/sono @ 36wks or weekly BPP   Deliver @ 39wks:______    3) BPP 4/8,   Treatment plan Go to MAU  for extra monitoring.  Judeth Horn, NP notified.   Meds: No orders of the defined types were placed in this encounter.    Reviewed: Preterm labor symptoms and general obstetric precautions including but not limited to vaginal bleeding, contractions, leaking of fluid and fetal movement were reviewed in detail with the patient.  All questions were answered. Has home bp cuff.. Check bp weekly, let us know if >140/90.    Future Appointments  Date Time Provider Department Center  07/25/2019  9:30 AM [facility] NURSE [facility]  07/28/2019  9:30 AM Lazaro Arms, MD [facility]  07/31/2019  9:30 AM [facility] NURSE [facility]  08/03/2019  9:45 AM [facility] [place] [facility] None  08/03/2019 10:50 AM Jacklyn Shell, CNM [facility]  08/07/2019  9:50 AM [facility] NURSE [facility]  08/10/2019  1:30 PM [facility] [place] [facility] None  08/10/2019  2:30 PM Cheral Marker, CNM [facility]  08/14/2019  9:30 AM [facility] NURSE [facility]  08/17/2019  9:00 AM [facility] [place] [facility] None  08/17/2019  9:50 AM Jacklyn Shell, CNM [facility]  08/22/2019  9:50 AM [facility] NURSE [facility]  08/25/2019  9:30 AM [facility] [place] [facility] None  08/25/2019 10:30 AM Cheral Marker, CNM [facility]    No orders of the defined types were placed in this encounter.  Jacklyn Shell DNP, CNM 07/20/2019 11:39 AM

## 2019-07-24 ENCOUNTER — Other Ambulatory Visit: Payer: 59

## 2019-07-25 ENCOUNTER — Ambulatory Visit (INDEPENDENT_AMBULATORY_CARE_PROVIDER_SITE_OTHER): Payer: 59 | Admitting: Obstetrics & Gynecology

## 2019-07-25 ENCOUNTER — Other Ambulatory Visit: Payer: 59

## 2019-07-25 ENCOUNTER — Other Ambulatory Visit: Payer: Self-pay

## 2019-07-25 ENCOUNTER — Ambulatory Visit (INDEPENDENT_AMBULATORY_CARE_PROVIDER_SITE_OTHER): Payer: 59

## 2019-07-25 VITALS — BP 128/88 | HR 130 | Wt 335.0 lb

## 2019-07-25 DIAGNOSIS — Z3A35 35 weeks gestation of pregnancy: Secondary | ICD-10-CM

## 2019-07-25 DIAGNOSIS — O10913 Unspecified pre-existing hypertension complicating pregnancy, third trimester: Secondary | ICD-10-CM

## 2019-07-25 DIAGNOSIS — O0990 Supervision of high risk pregnancy, unspecified, unspecified trimester: Secondary | ICD-10-CM

## 2019-07-25 DIAGNOSIS — O0993 Supervision of high risk pregnancy, unspecified, third trimester: Secondary | ICD-10-CM

## 2019-07-25 DIAGNOSIS — O10919 Unspecified pre-existing hypertension complicating pregnancy, unspecified trimester: Secondary | ICD-10-CM

## 2019-07-25 NOTE — Progress Notes (Signed)
[place] 35 wks,cephalic,fhr 140 bpm,anterior placenta gr 3,afi 14.3 cm,RI .68,.60,.60,.56=55%,BPP 8/8

## 2019-07-25 NOTE — Progress Notes (Signed)
   HIGH-RISK PREGNANCY VISIT Patient name: Rebecca Gardner MRN [id number]  Date of birth: 28-Jun-1995 Chief Complaint:   Routine Prenatal Visit  History of Present Illness:   Rebecca Gardner is a 24 y.o. G67P0000 female at [redacted]w[redacted]d with an Estimated Date of Delivery: 08/29/19 being seen today for ongoing management of a high-risk pregnancy complicated by chronic hypertension currently on labetalo 200 BID.  Today she reports no complaints. Contractions: Not present. Vag. Bleeding: None.  Movement: Present. denies leaking of fluid.  Review of Systems:   Pertinent items are noted in HPI Denies abnormal vaginal discharge w/ itching/odor/irritation, headaches, visual changes, shortness of breath, chest pain, abdominal pain, severe nausea/vomiting, or problems with urination or bowel movements unless otherwise stated above. Pertinent History Reviewed:  Reviewed past medical,surgical, social, obstetrical and family history.  Reviewed problem list, medications and allergies. Physical Assessment:   Vitals:   07/25/19 0956  BP: 128/88  Pulse: (!) 130  Weight: (!) 335 lb (152 kg)  Body mass index is 50.94 kg/m.           Physical Examination:   General appearance: alert, well appearing, and in no distress  Mental status: alert, oriented to person, place, and time  Skin: warm & dry   Extremities: Edema: Trace    Cardiovascular: normal heart rate noted  Respiratory: normal respiratory effort, no distress  Abdomen: gravid, soft, non-tender  Pelvic: Cervical exam deferred         Fetal Status:     Movement: Present    Fetal Surveillance Testing today: BPP 8/8   Chaperone: n/a    No results found for this or any previous visit (from the past 24 hour(s)).  Assessment & Plan:  1) High-risk pregnancy G1P0000 at [redacted]w[redacted]d with an Estimated Date of Delivery: 08/29/19   2) CHTN, stable on labetalol 200 BID    Meds: No orders of the defined types were placed in this encounter.   Labs/procedures today:  BPP 8/8 with normal Dopplers  Treatment Plan:  NST scheduled Friday  Reviewed: Preterm labor symptoms and general obstetric precautions including but not limited to vaginal bleeding, contractions, leaking of fluid and fetal movement were reviewed in detail with the patient.  All questions were answered. Has home bp cuff. Rx faxed to . Check bp weekly, let us know if >140/90.   Follow-up: Return in about 3 days (around 07/28/2019) for NST.  No orders of the defined types were placed in this encounter.  Amaryllis Dyke Eure  07/25/2019 10:30 AM

## 2019-07-26 ENCOUNTER — Ambulatory Visit (INDEPENDENT_AMBULATORY_CARE_PROVIDER_SITE_OTHER): Payer: 59

## 2019-07-26 DIAGNOSIS — O99419 Diseases of the circulatory system complicating pregnancy, unspecified trimester: Secondary | ICD-10-CM

## 2019-07-26 DIAGNOSIS — R002 Palpitations: Secondary | ICD-10-CM | POA: Diagnosis not present

## 2019-07-26 DIAGNOSIS — I471 Supraventricular tachycardia: Secondary | ICD-10-CM

## 2019-07-28 ENCOUNTER — Other Ambulatory Visit: Payer: Self-pay

## 2019-07-28 ENCOUNTER — Ambulatory Visit (INDEPENDENT_AMBULATORY_CARE_PROVIDER_SITE_OTHER): Payer: 59 | Admitting: Obstetrics & Gynecology

## 2019-07-28 VITALS — BP 142/92 | HR 125 | Wt 338.0 lb

## 2019-07-28 DIAGNOSIS — O163 Unspecified maternal hypertension, third trimester: Secondary | ICD-10-CM

## 2019-07-28 DIAGNOSIS — Z3A39 39 weeks gestation of pregnancy: Secondary | ICD-10-CM

## 2019-07-28 DIAGNOSIS — Z331 Pregnant state, incidental: Secondary | ICD-10-CM

## 2019-07-28 DIAGNOSIS — Z1389 Encounter for screening for other disorder: Secondary | ICD-10-CM

## 2019-07-31 ENCOUNTER — Other Ambulatory Visit: Payer: Self-pay

## 2019-07-31 ENCOUNTER — Ambulatory Visit (INDEPENDENT_AMBULATORY_CARE_PROVIDER_SITE_OTHER): Payer: 59 | Admitting: *Deleted

## 2019-07-31 ENCOUNTER — Ambulatory Visit (INDEPENDENT_AMBULATORY_CARE_PROVIDER_SITE_OTHER): Payer: 59

## 2019-07-31 VITALS — BP 142/87 | HR 133 | Wt 339.0 lb

## 2019-07-31 DIAGNOSIS — Z1389 Encounter for screening for other disorder: Secondary | ICD-10-CM

## 2019-07-31 DIAGNOSIS — O288 Other abnormal findings on antenatal screening of mother: Secondary | ICD-10-CM

## 2019-07-31 DIAGNOSIS — O10013 Pre-existing essential hypertension complicating pregnancy, third trimester: Secondary | ICD-10-CM | POA: Diagnosis not present

## 2019-07-31 DIAGNOSIS — Z3A35 35 weeks gestation of pregnancy: Secondary | ICD-10-CM | POA: Diagnosis not present

## 2019-07-31 DIAGNOSIS — Z23 Encounter for immunization: Secondary | ICD-10-CM

## 2019-07-31 DIAGNOSIS — Z331 Pregnant state, incidental: Secondary | ICD-10-CM

## 2019-07-31 DIAGNOSIS — O0990 Supervision of high risk pregnancy, unspecified, unspecified trimester: Secondary | ICD-10-CM

## 2019-07-31 LAB — POCT URINALYSIS DIPSTICK OB
Blood, UA: NEGATIVE
Glucose, UA: NEGATIVE
Ketones, UA: NEGATIVE
Leukocytes, UA: NEGATIVE
Nitrite, UA: NEGATIVE
POC,PROTEIN,UA: NEGATIVE

## 2019-07-31 NOTE — Progress Notes (Signed)
Tdap vaccine given. Patient tolerated well.

## 2019-07-31 NOTE — Progress Notes (Signed)
   NURSE VISIT- NST  SUBJECTIVE:  Rebecca Gardner is a 24 y.o. G1P0000 female at [redacted]w[redacted]d, here for a NST for pregnancy complicated by [facility].  She reports active fetal movement, contractions: none, vaginal bleeding: none, membranes: intact.   OBJECTIVE:  BP (!) 142/87   Pulse (!) 133   Wt (!) 339 lb (153.8 kg)   LMP 11/22/2018 (Approximate)   BMI 51.54 kg/m   Appears well, no apparent distress  Results for orders placed or performed in visit on 07/31/19 (from the past 24 hour(s))  POC Urinalysis Dipstick OB   Collection Time: 07/31/19  9:41 AM  Result Value Ref Range   Color, UA     Clarity, UA     Glucose, UA Negative Negative   Bilirubin, UA     Ketones, UA neg    Spec Grav, UA     Blood, UA neg    pH, UA     POC,PROTEIN,UA Negative Negative, Trace, Small (1+), Moderate (2+), Large (3+), 4+   Urobilinogen, UA     Nitrite, UA neg    Leukocytes, UA Negative Negative   Appearance     Odor      NST: FHR baseline 140 bpm, Variability: moderate, Accelerations:present, Decelerations:  Absent= Cat 1/Reactive Toco: none   ASSESSMENT: G1P0000 at [redacted]w[redacted]d with CHTN NST reactive  PLAN: EFM strip reviewed by Cathie Beams, CNM   Recommendations: keep next appointment as scheduled    Jobe Marker  07/31/2019 4:12 PM

## 2019-08-01 ENCOUNTER — Other Ambulatory Visit: Payer: 59

## 2019-08-01 ENCOUNTER — Other Ambulatory Visit: Payer: Self-pay

## 2019-08-01 DIAGNOSIS — I471 Supraventricular tachycardia: Secondary | ICD-10-CM

## 2019-08-02 DIAGNOSIS — Z3482 Encounter for supervision of other normal pregnancy, second trimester: Secondary | ICD-10-CM | POA: Diagnosis not present

## 2019-08-02 DIAGNOSIS — Z331 Pregnant state, incidental: Secondary | ICD-10-CM | POA: Diagnosis not present

## 2019-08-02 DIAGNOSIS — Z7689 Persons encountering health services in other specified circumstances: Secondary | ICD-10-CM | POA: Diagnosis not present

## 2019-08-02 DIAGNOSIS — Z3483 Encounter for supervision of other normal pregnancy, third trimester: Secondary | ICD-10-CM | POA: Diagnosis not present

## 2019-08-03 ENCOUNTER — Other Ambulatory Visit: Payer: Self-pay

## 2019-08-03 ENCOUNTER — Encounter (HOSPITAL_COMMUNITY): Payer: Self-pay | Admitting: Obstetrics and Gynecology

## 2019-08-03 ENCOUNTER — Inpatient Hospital Stay (HOSPITAL_COMMUNITY)
Admission: AD | Admit: 2019-08-03 | Discharge: 2019-08-03 | Disposition: A | Discharge status: Home / Self Care | Payer: 59 | Attending: Obstetrics and Gynecology | Admitting: Obstetrics and Gynecology

## 2019-08-03 ENCOUNTER — Ambulatory Visit (INDEPENDENT_AMBULATORY_CARE_PROVIDER_SITE_OTHER): Payer: 59

## 2019-08-03 ENCOUNTER — Other Ambulatory Visit (HOSPITAL_COMMUNITY)
Admission: RE | Admit: 2019-08-03 | Discharge: 2019-08-03 | Disposition: A | Discharge status: Home / Self Care | Payer: 59 | Source: Ambulatory Visit | Attending: Advanced Practice Midwife | Admitting: Advanced Practice Midwife

## 2019-08-03 ENCOUNTER — Ambulatory Visit (INDEPENDENT_AMBULATORY_CARE_PROVIDER_SITE_OTHER): Payer: 59 | Admitting: Advanced Practice Midwife

## 2019-08-03 VITALS — BP 160/100 | HR 130 | Wt 338.0 lb

## 2019-08-03 DIAGNOSIS — O0990 Supervision of high risk pregnancy, unspecified, unspecified trimester: Secondary | ICD-10-CM

## 2019-08-03 DIAGNOSIS — O10913 Unspecified pre-existing hypertension complicating pregnancy, third trimester: Secondary | ICD-10-CM

## 2019-08-03 DIAGNOSIS — Z3A36 36 weeks gestation of pregnancy: Secondary | ICD-10-CM

## 2019-08-03 DIAGNOSIS — Z1389 Encounter for screening for other disorder: Secondary | ICD-10-CM

## 2019-08-03 DIAGNOSIS — Z7982 Long term (current) use of aspirin: Secondary | ICD-10-CM | POA: Diagnosis not present

## 2019-08-03 DIAGNOSIS — O10919 Unspecified pre-existing hypertension complicating pregnancy, unspecified trimester: Secondary | ICD-10-CM

## 2019-08-03 DIAGNOSIS — Z331 Pregnant state, incidental: Secondary | ICD-10-CM

## 2019-08-03 DIAGNOSIS — R030 Elevated blood-pressure reading, without diagnosis of hypertension: Secondary | ICD-10-CM | POA: Diagnosis present

## 2019-08-03 DIAGNOSIS — O10013 Pre-existing essential hypertension complicating pregnancy, third trimester: Secondary | ICD-10-CM | POA: Insufficient documentation

## 2019-08-03 DIAGNOSIS — I10 Essential (primary) hypertension: Secondary | ICD-10-CM

## 2019-08-03 DIAGNOSIS — O0993 Supervision of high risk pregnancy, unspecified, third trimester: Secondary | ICD-10-CM

## 2019-08-03 LAB — COMPREHENSIVE METABOLIC PANEL
ALT: 13 U/L (ref 0–44)
AST: 13 U/L — ABNORMAL LOW (ref 15–41)
Albumin: 2.5 g/dL — ABNORMAL LOW (ref 3.5–5.0)
Alkaline Phosphatase: 99 U/L (ref 38–126)
Anion gap: 9 (ref 5–15)
BUN: 5 mg/dL — ABNORMAL LOW (ref 6–20)
CO2: 18 mmol/L — ABNORMAL LOW (ref 22–32)
Calcium: 8.5 mg/dL — ABNORMAL LOW (ref 8.9–10.3)
Chloride: 111 mmol/L (ref 98–111)
Creatinine, Ser: 0.81 mg/dL (ref 0.44–1.00)
GFR calc Af Amer: >60 mL/min (ref >60)
GFR calc non Af Amer: >60 mL/min (ref >60)
Glucose, Bld: 128 mg/dL — ABNORMAL HIGH (ref 70–99)
Potassium: 3.5 mmol/L (ref 3.5–5.1)
Sodium: 138 mmol/L (ref 135–145)
Total Bilirubin: 0.4 mg/dL (ref 0.3–1.2)
Total Protein: 5.9 g/dL — ABNORMAL LOW (ref 6.5–8.1)

## 2019-08-03 LAB — POCT URINALYSIS DIPSTICK OB
Blood, UA: NEGATIVE
Glucose, UA: NEGATIVE
Ketones, UA: NEGATIVE
Leukocytes, UA: NEGATIVE
Nitrite, UA: NEGATIVE

## 2019-08-03 LAB — URINALYSIS, ROUTINE W REFLEX MICROSCOPIC
Bilirubin Urine: NEGATIVE
Glucose, UA: NEGATIVE mg/dL
Hgb urine dipstick: NEGATIVE
Ketones, ur: NEGATIVE mg/dL
Nitrite: NEGATIVE
Protein, ur: NEGATIVE mg/dL
Specific Gravity, Urine: 1.021 (ref 1.005–1.030)
pH: 5 (ref 5.0–8.0)

## 2019-08-03 LAB — CBC
HCT: 34.4 % — ABNORMAL LOW (ref 36.0–46.0)
Hemoglobin: 11.5 g/dL — ABNORMAL LOW (ref 12.0–15.0)
MCH: 27.3 pg (ref 26.0–34.0)
MCHC: 33.4 g/dL (ref 30.0–36.0)
MCV: 81.5 fL (ref 80.0–100.0)
Platelets: 233 10*3/uL (ref 150–400)
RBC: 4.22 MIL/uL (ref 3.87–5.11)
RDW: 14.5 % (ref 11.5–15.5)
WBC: 10.9 10*3/uL — ABNORMAL HIGH (ref 4.0–10.5)
nRBC: 0 % (ref 0.0–0.2)

## 2019-08-03 LAB — PROTEIN / CREATININE RATIO, URINE
Creatinine, Urine: 174.2 mg/dL
Protein Creatinine Ratio: 0.1 mg/mg{Cre} (ref 0.00–0.15)
Total Protein, Urine: 18 mg/dL

## 2019-08-03 MED ORDER — LABETALOL HCL 5 MG/ML IV SOLN: 40.0000 mg | INTRAVENOUS | Status: DC | PRN

## 2019-08-03 MED ORDER — LABETALOL HCL 5 MG/ML IV SOLN: 80.0000 mg | INTRAVENOUS | Status: DC | PRN

## 2019-08-03 MED ORDER — LABETALOL HCL 5 MG/ML IV SOLN: 20.0000 mg | INTRAVENOUS | Status: DC | PRN

## 2019-08-03 MED ORDER — HYDRALAZINE HCL 20 MG/ML IJ SOLN: 10.0000 mg | INTRAMUSCULAR | Status: DC | PRN

## 2019-08-03 NOTE — Patient Instructions (Signed)

## 2019-08-03 NOTE — Progress Notes (Signed)
HIGH-RISK PREGNANCY VISIT Patient name: Rebecca Gardner MRN [id number]  Date of birth: 05/11/1996 Chief Complaint:   Routine Prenatal Visit  History of Present Illness:   Rebecca Gardner is a 24 y.o. G42P0000 female at [redacted]w[redacted]d with an Estimated Date of Delivery: 08/29/19 being seen today for ongoing management of a high-risk pregnancy complicated by [facility] currently on Labetalol 200mg  BID, took meds this am Today she reports having had a HA yesterday, seeing black spots.  HA not relieved w/APAP. Continued to work, HA went away when she got home  And rested. No HA today. Contractions: Not present.  .  Movement: Present. denies leaking of fluid.  Review of Systems:   Pertinent items are noted in HPI Denies abnormal vaginal discharge w/ itching/odor/irritation, headaches, visual changes, shortness of breath, chest pain, abdominal pain, severe nausea/vomiting, or problems with urination or bowel movements unless otherwise stated above. Pertinent History Reviewed:  Reviewed past medical,surgical, social, obstetrical and family history.  Reviewed problem list, medications and allergies. Physical Assessment:   Vitals:   08/03/19 1019 08/03/19 1029  BP: (!) 156/107 (!) 160/100  Pulse: (!) 130   Weight: (!) 338 lb (153.3 kg)   Body mass index is 51.39 kg/m.           Physical Examination:   General appearance: alert, well appearing, and in no distress  Mental status: alert, oriented to person, place, and time  Skin: warm & dry   Extremities: Edema: Trace    Cardiovascular: normal heart rate noted  Respiratory: normal respiratory effort, no distress  Abdomen: gravid, soft, non-tender  Pelvic: Cervical exam performed  Dilation: 1 Effacement (%): Thick Station: -3  Fetal Status:     Movement: Present Presentation: Vertex  Fetal Surveillance Testing today: 08/05/19 36+2 wks,cephalic,BPP 8/8,FHR 137 bpm,anterior placenta gr 3,ai 15.5 cm,RI .60,.62,.61,.59=67%   Results for orders placed or performed in  visit on 08/03/19 (from the past 24 hour(s))  POC Urinalysis Dipstick OB   Collection Time: 08/03/19 10:26 AM  Result Value Ref Range   Color, UA     Clarity, UA     Glucose, UA Negative Negative   Bilirubin, UA     Ketones, UA neg    Spec Grav, UA     Blood, UA neg    pH, UA     POC,PROTEIN,UA Small (1+) Negative, Trace, Small (1+), Moderate (2+), Large (3+), 4+   Urobilinogen, UA     Nitrite, UA neg    Leukocytes, UA Negative Negative   Appearance     Odor      Assessment & Plan:  1) High-risk pregnancy G1P0000 at [redacted]w[redacted]d with an Estimated Date of Delivery: 08/29/19   2) CHTN, elevated today Treatment plan  Discussed w/P Constant, MD.  Will send to MAU for further eval   Meds: No orders of the defined types were placed in this encounter.   Labs/procedures today: BPP/dopplers   Reviewed: Term labor symptoms and general obstetric precautions including but not limited to vaginal bleeding, contractions, leaking of fluid and fetal movement were reviewed in detail with the patient.  All questions were answered. Follow-up: to MAU now  Future Appointments  Date Time Provider Department Center  08/07/2019  9:50 AM [facility] NURSE [facility]  08/10/2019  1:30 PM [facility] 10/10/2019 [facility] None  08/10/2019  2:30 PM 10/10/2019, CNM [facility]  08/14/2019  9:30 AM [facility] NURSE [facility]  08/17/2019  9:00 AM [facility] 10/17/2019 [facility]  None  08/17/2019  9:50 AM Christin Fudge, CNM [facility]  08/22/2019  9:50 AM [facility] NURSE [facility]  08/25/2019  9:30 AM [facility] [place] [facility] None  08/25/2019 10:30 AM Roma Schanz, CNM [facility]    Orders Placed This Encounter  Procedures  . Culture, beta strep (group b only)  . POC Urinalysis Dipstick OB   Christin Fudge DNP, CNM 08/03/2019 11:07 AM

## 2019-08-03 NOTE — Discharge Instructions (Signed)
Preeclampsia and Eclampsia Preeclampsia is a serious condition that may develop during pregnancy. This condition causes high blood pressure and increased protein in your urine along with other symptoms, such as headaches and vision changes. These symptoms may develop as the condition gets worse. Preeclampsia may occur at 20 weeks of pregnancy or later. Diagnosing and treating preeclampsia early is very important. If not treated early, it can cause serious problems for you and your baby. One problem it can lead to is eclampsia. Eclampsia is a condition that causes muscle jerking or shaking (convulsions or seizures) and other serious problems for the mother. During pregnancy, delivering your baby may be the best treatment for preeclampsia or eclampsia. For most women, preeclampsia and eclampsia symptoms go away after giving birth. In rare cases, a woman may develop preeclampsia after giving birth (postpartum preeclampsia). This usually occurs within 48 hours after childbirth but may occur up to 6 weeks after giving birth. What are the causes? The cause of preeclampsia is not known. What increases the risk? The following risk factors make you more likely to develop preeclampsia:  Being pregnant for the first time.  Having had preeclampsia during a past pregnancy.  Having a family history of preeclampsia.  Having high blood pressure.  Being pregnant with more than one baby.  Being 35 or older.  Being African-American.  Having kidney disease or diabetes.  Having medical conditions such as lupus or blood diseases.  Being very overweight (obese). What are the signs or symptoms? The most common symptoms are:  Severe headaches.  Vision problems, such as blurred or double vision.  Abdominal pain, especially upper abdominal pain. Other symptoms that may develop as the condition gets worse include:  Sudden weight gain.  Sudden swelling of the hands, face, legs, and feet.  Severe nausea  and vomiting.  Numbness in the face, arms, legs, and feet.  Dizziness.  Urinating less than usual.  Slurred speech.  Convulsions or seizures. How is this diagnosed? There are no screening tests for preeclampsia. Your health care provider will ask you about symptoms and check for signs of preeclampsia during your prenatal visits. You may also have tests that include:  Checking your blood pressure.  Urine tests to check for protein. Your health care provider will check for this at every prenatal visit.  Blood tests.  Monitoring your baby's heart rate.  Ultrasound. How is this treated? You and your health care provider will determine the treatment approach that is best for you. Treatment may include:  Having more frequent prenatal exams to check for signs of preeclampsia, if you have an increased risk for preeclampsia.  Medicine to lower your blood pressure.  Staying in the hospital, if your condition is severe. There, treatment will focus on controlling your blood pressure and the amount of fluids in your body (fluid retention).  Taking medicine (magnesium sulfate) to prevent seizures. This may be given as an injection or through an IV.  Taking a low-dose aspirin during your pregnancy.  Delivering your baby early. You may have your labor started with medicine (induced), or you may have a cesarean delivery. Follow these instructions at home: Eating and drinking   Drink enough fluid to keep your urine pale yellow.  Avoid caffeine. Lifestyle  Do not use any products that contain nicotine or tobacco, such as cigarettes and e-cigarettes. If you need help quitting, ask your health care provider.  Do not use alcohol or drugs.  Avoid stress as much as possible. Rest and get   plenty of sleep. General instructions  Take over-the-counter and prescription medicines only as told by your health care provider.  When lying down, lie on your left side. This keeps pressure off your  major blood vessels.  When sitting or lying down, raise (elevate) your feet. Try putting some pillows underneath your lower legs.  Exercise regularly. Ask your health care provider what kinds of exercise are best for you.  Keep all follow-up and prenatal visits as told by your health care provider. This is important. How is this prevented? There is no known way of preventing preeclampsia or eclampsia from developing. However, to lower your risk of complications and detect problems early:  Get regular prenatal care. Your health care provider may be able to diagnose and treat the condition early.  Maintain a healthy weight. Ask your health care provider for help managing weight gain during pregnancy.  Work with your health care provider to manage any long-term (chronic) health conditions you have, such as diabetes or kidney problems.  You may have tests of your blood pressure and kidney function after giving birth.  Your health care provider may have you take low-dose aspirin during your next pregnancy. Contact a health care provider if:  You have symptoms that your health care provider told you may require more treatment or monitoring, such as: ? Headaches. ? Nausea or vomiting. ? Abdominal pain. ? Dizziness. ? Light-headedness. Get help right away if:  You have severe: ? Abdominal pain. ? Headaches that do not get better. ? Dizziness. ? Vision problems. ? Confusion. ? Nausea or vomiting.  You have any of the following: ? A seizure. ? Sudden, rapid weight gain. ? Sudden swelling in your hands, ankles, or face. ? Trouble moving any part of your body. ? Numbness in any part of your body. ? Trouble speaking. ? Abnormal bleeding.  You faint. Summary  Preeclampsia is a serious condition that may develop during pregnancy.  This condition causes high blood pressure and increased protein in your urine along with other symptoms, such as headaches and vision  changes.  Diagnosing and treating preeclampsia early is very important. If not treated early, it can cause serious problems for you and your baby.  Get help right away if you have symptoms that your health care provider told you to watch for. This information is not intended to replace advice given to you by your health care provider. Make sure you discuss any questions you have with your health care provider. Document Revised: 01/25/2018 Document Reviewed: 12/30/2015 Elsevier Patient Education  2020 [employer]  

## 2019-08-03 NOTE — MAU Note (Signed)
.   Rebecca Gardner is a 24 y.o. at [redacted]w[redacted]d here in MAU reporting: she was sent from office for [facility] labs and monitoring. B/P up in office today Onset of complaint: today  Pain score: 3 Vitals:   08/03/19 1246 08/03/19 1247  BP:  (!) 124/92  Pulse: (!) 113   Resp: 16   Temp: 97.8 F (36.6 C)   SpO2:       FHT: Lab orders placed from triage: UA

## 2019-08-03 NOTE — MAU Provider Note (Signed)
History     CSN: [id number]  Arrival date and time: 08/03/19 1148   First Provider Initiated Contact with Patient 08/03/19 1306      Chief Complaint  Patient presents with  . Hypertension   HPI Ms. Rebecca Gardner is a 24 y.o. G1P0000 at [redacted]w[redacted]d who presents to MAU today from [facility] for further evaluation of elevated blood pressure. The patient has CHTN and was put on Labetalol early in pregnancy. She is also taking BASA. She denies any concerning symptoms today, but did have severe headache with scotoma yesterday. She denies abdominal pain. She has mild LE edema that started earlier this week. She reports normal fetal movement. She denies vaginal bleeding, LOF or regular contractions.    OB History    Gravida  1   Para  0   Term  0   Preterm  0   AB  0   Living  0     SAB  0   TAB  0   Ectopic  0   Multiple  0   Live Births              Past Medical History:  Diagnosis Date  . Acute lateral meniscus tear of left knee 02/02/2014  . Acute medial meniscus tear of left knee   . Concussion with brief (less than one hour) loss of consciousness 4-28 15  . Depression   . Gall stones 03/27/2016  . Hypertension   . Nephrolithiasis     Past Surgical History:  Procedure Laterality Date  . CHOLECYSTECTOMY N/A 04/10/2016   Procedure: LAPAROSCOPIC CHOLECYSTECTOMY;  Surgeon: Rodman Pickle, MD;  Location: [facility] OR;  Service: General;  Laterality: N/A;  . KNEE ARTHROSCOPY WITH LATERAL MENISECTOMY Left 02/02/2014   Procedure: LEFT KNEE ARTHROSCOPY WITH LATERAL MENISECTOMY;  Surgeon: Eulas Post, MD;  Location: [facility];  Service: Orthopedics;  Laterality: Left;  . TONSILLECTOMY      Family History  Problem Relation Age of Onset  . Breast cancer Paternal Grandmother   . Healthy Mother   . Atrial fibrillation Father   . Prostate cancer Paternal Grandfather   . Cancer Paternal Grandfather        liver  . Migraines Maternal Uncle   . COPD  Maternal Uncle     Social History   Tobacco Use  . Smoking status: Never Smoker  . Smokeless tobacco: Never Used  Substance Use Topics  . Alcohol use: No  . Drug use: No    Allergies: No Known Allergies  Medications Prior to Admission  Medication Sig Dispense Refill Last Dose  . aspirin EC 81 MG tablet Take 2 tablets (162 mg total) by mouth daily. 60 tablet 6 08/03/2019 at Unknown time  . labetalol (NORMODYNE) 200 MG tablet Take 1 tablet (200 mg total) by mouth 2 (two) times daily. 60 tablet 3 08/03/2019 at Unknown time  . omeprazole (PRILOSEC) 20 MG capsule Take 1 capsule (20 mg total) by mouth daily. 1 tablet a day 30 capsule 6 08/03/2019 at Unknown time  . Prenatal Vit-Fe Fumarate-FA (PRENATAL VITAMIN PO) Take by mouth daily.   08/03/2019 at Unknown time  . cephALEXin (KEFLEX) 500 MG capsule Take 500 mg by mouth 4 (four) times daily.   More than a month at Unknown time  . cyclobenzaprine (FLEXERIL) 10 MG tablet Take 1 tablet (10 mg total) by mouth every 8 (eight) hours as needed for muscle spasms. (Patient not taking: Reported on 08/03/2019) 30 tablet  2 More than a month at Unknown time  . ondansetron (ZOFRAN ODT) 4 MG disintegrating tablet Take 1 tablet (4 mg total) by mouth every 6 (six) hours as needed for nausea. (Patient not taking: Reported on 07/31/2019) 30 tablet 2 More than a month at Unknown time  . traMADol (ULTRAM) 50 MG tablet Take 1 tablet (50 mg total) by mouth every 6 (six) hours as needed for moderate pain. (Patient not taking: Reported on 07/31/2019) 10 tablet 0 More than a month at Unknown time    Review of Systems  Constitutional: Negative for fever.  Eyes: Negative for visual disturbance.  Cardiovascular: Positive for leg swelling.  Gastrointestinal: Negative for abdominal pain, constipation, diarrhea, nausea and vomiting.  Genitourinary: Negative for vaginal bleeding and vaginal discharge.  Neurological: Negative for headaches.   Physical Exam   Blood pressure  138/76, pulse 99, temperature 97.8 F (36.6 C), resp. rate 16, weight (!) 154.7 kg, last menstrual period 11/22/2018, SpO2 99 %.  Physical Exam  Nursing note and vitals reviewed. Constitutional: She is oriented to person, place, and time. She appears well-developed and well-nourished. No distress.  HENT:  Head: Normocephalic and atraumatic.  Cardiovascular: Normal rate.  Respiratory: Effort normal.  GI: Soft. She exhibits no distension and no mass. There is no abdominal tenderness. There is no rebound and no guarding.  Musculoskeletal:        General: Edema (mild non-pitting edema bilaterally to mid shin) present.  Neurological: She is alert and oriented to person, place, and time. She has normal reflexes.  No clonus  Skin: Skin is warm and dry. No erythema.  Psychiatric: She has a normal mood and affect.   Patient Vitals for the past 24 hrs:  BP Temp Pulse Resp SpO2 Weight  08/03/19 1430 138/76 -- 99 -- 99 % --  08/03/19 1415 (!) 146/99 -- 100 -- 99 % --  08/03/19 1400 137/86 -- 100 -- 99 % --  08/03/19 1345 128/84 -- (!) 103 -- 100 % --  08/03/19 1330 132/84 -- (!) 106 -- 98 % --  08/03/19 1315 121/75 -- (!) 107 -- 98 % --  08/03/19 1300 134/73 -- (!) 117 -- 100 % --  08/03/19 1251 -- -- -- -- -- (!) 154.7 kg  08/03/19 1247 (!) 124/92 -- -- -- -- --  08/03/19 1246 -- 97.8 F (36.6 C) (!) 113 16 -- --  08/03/19 1245 -- -- -- -- 100 % --    Results for orders placed or performed during the hospital encounter of 08/03/19 (from the past 24 hour(s))  Protein / creatinine ratio, urine     Status: None   Collection Time: 08/03/19 12:38 PM  Result Value Ref Range   Creatinine, Urine 174.20 mg/dL   Total Protein, Urine 18 mg/dL   Protein Creatinine Ratio 0.10 0.00 - 0.15 mg/mg[Cre]  Urinalysis, Routine w reflex microscopic     Status: Abnormal   Collection Time: 08/03/19 12:43 PM  Result Value Ref Range   Color, Urine YELLOW YELLOW   APPearance HAZY (A) CLEAR   Specific  Gravity, Urine 1.021 1.005 - 1.030   pH 5.0 5.0 - 8.0   Glucose, UA NEGATIVE NEGATIVE mg/dL   Hgb urine dipstick NEGATIVE NEGATIVE   Bilirubin Urine NEGATIVE NEGATIVE   Ketones, ur NEGATIVE NEGATIVE mg/dL   Protein, ur NEGATIVE NEGATIVE mg/dL   Nitrite NEGATIVE NEGATIVE   Leukocytes,Ua TRACE (A) NEGATIVE   RBC / HPF 0-5 0 - 5 RBC/hpf   WBC, UA  6-10 0 - 5 WBC/hpf   Bacteria, UA FEW (A) NONE SEEN   Squamous Epithelial / LPF 0-5 0 - 5   Mucus PRESENT    Hyaline Casts, UA PRESENT   CBC     Status: Abnormal   Collection Time: 08/03/19 12:52 PM  Result Value Ref Range   WBC 10.9 (H) 4.0 - 10.5 K/uL   RBC 4.22 3.87 - 5.11 MIL/uL   Hemoglobin 11.5 (L) 12.0 - 15.0 g/dL   HCT 62.8 (L) 36.6 - 29.4 %   MCV 81.5 80.0 - 100.0 fL   MCH 27.3 26.0 - 34.0 pg   MCHC 33.4 30.0 - 36.0 g/dL   RDW 76.5 46.5 - 03.5 %   Platelets 233 150 - 400 K/uL   nRBC 0.0 0.0 - 0.2 %  Comprehensive metabolic panel     Status: Abnormal   Collection Time: 08/03/19 12:52 PM  Result Value Ref Range   Sodium 138 135 - 145 mmol/L   Potassium 3.5 3.5 - 5.1 mmol/L   Chloride 111 98 - 111 mmol/L   CO2 18 (L) 22 - 32 mmol/L   Glucose, Bld 128 (H) 70 - 99 mg/dL   BUN 5 (L) 6 - 20 mg/dL   Creatinine, Ser 4.65 0.44 - 1.00 mg/dL   Calcium 8.5 (L) 8.9 - 10.3 mg/dL   Total Protein 5.9 (L) 6.5 - 8.1 g/dL   Albumin 2.5 (L) 3.5 - 5.0 g/dL   AST 13 (L) 15 - 41 U/L   ALT 13 0 - 44 U/L   Alkaline Phosphatase 99 38 - 126 U/L   Total Bilirubin 0.4 0.3 - 1.2 mg/dL   GFR calc non Af Amer >60 >60 mL/min   GFR calc Af Amer >60 >60 mL/min   Anion gap 9 5 - 15   Fetal Monitoring: Baseline: 140 bpm Variability: moderate Accelerations: 15 x 15 Decelerations: none Contractions: none   MAU Course  Procedures None  MDM UA from office reviewed. CBC, CMP and Urine protein/creatinine ratio today  Serial BPs Discussed patient with Dr. Alysia Penna. No evidence of pre-eclampsia. Patient has scheduled appointment at [facility] on Monday.  Recommends keeping that appointment as scheduled and strict pre-eclampsia precautions.   Assessment and Plan  A: SIUP at [redacted]w[redacted]d CHTN in pregnancy, third trimester   P: Discharge home Continue Labetalol as prescribed  Pre-eclampsia precautions discussed Patient advised to follow-up with [facility] as scheduled on Monday  Patient may return to MAU as needed or if her condition were to change or worsen   Vonzella Nipple, PA-C 08/03/2019, 2:47 PM

## 2019-08-03 NOTE — Progress Notes (Signed)
poc

## 2019-08-03 NOTE — Progress Notes (Signed)
[place] 36+2 wks,cephalic,BPP 8/8,FHR 137 bpm,anterior placenta gr 3,ai 15.5 cm,RI .60,.62,.61,.59=67%

## 2019-08-04 LAB — CERVICOVAGINAL ANCILLARY ONLY
Chlamydia: NEGATIVE
Comment: NEGATIVE
Comment: NORMAL
Neisseria Gonorrhea: NEGATIVE

## 2019-08-07 ENCOUNTER — Other Ambulatory Visit: Payer: Self-pay

## 2019-08-07 ENCOUNTER — Telehealth: Payer: Self-pay | Admitting: *Deleted

## 2019-08-07 ENCOUNTER — Ambulatory Visit (INDEPENDENT_AMBULATORY_CARE_PROVIDER_SITE_OTHER): Payer: 59 | Admitting: *Deleted

## 2019-08-07 VITALS — BP 150/96 | HR 147 | Wt 344.0 lb

## 2019-08-07 DIAGNOSIS — O0990 Supervision of high risk pregnancy, unspecified, unspecified trimester: Secondary | ICD-10-CM

## 2019-08-07 DIAGNOSIS — Z331 Pregnant state, incidental: Secondary | ICD-10-CM

## 2019-08-07 DIAGNOSIS — Z3A36 36 weeks gestation of pregnancy: Secondary | ICD-10-CM | POA: Diagnosis not present

## 2019-08-07 DIAGNOSIS — Z1389 Encounter for screening for other disorder: Secondary | ICD-10-CM

## 2019-08-07 DIAGNOSIS — O10013 Pre-existing essential hypertension complicating pregnancy, third trimester: Secondary | ICD-10-CM | POA: Diagnosis not present

## 2019-08-07 LAB — POCT URINALYSIS DIPSTICK OB
Blood, UA: NEGATIVE
Glucose, UA: NEGATIVE
Ketones, UA: NEGATIVE
Leukocytes, UA: NEGATIVE
Nitrite, UA: NEGATIVE
POC,PROTEIN,UA: NEGATIVE

## 2019-08-07 LAB — CULTURE, BETA STREP (GROUP B ONLY): Strep Gp B Culture: POSITIVE — AB

## 2019-08-07 LAB — OB RESULTS CONSOLE GBS: GBS: POSITIVE

## 2019-08-07 NOTE — Progress Notes (Signed)
   NURSE VISIT- NST  SUBJECTIVE:  Rebecca Gardner is a 24 y.o. G1P0000 female at [redacted]w[redacted]d, here for a BP check and NST for pregnancy complicated by [facility].  She reports active fetal movement, contractions: none, vaginal bleeding: none, membranes: intact.   OBJECTIVE:  BP (!) 150/96   Pulse (!) 147   Wt (!) 344 lb (156 kg)   LMP 11/22/2018 (Approximate)   BMI 52.31 kg/m   Appears well, no apparent distress  Results for orders placed or performed in visit on 08/07/19 (from the past 24 hour(s))  POC Urinalysis Dipstick OB   Collection Time: 08/07/19 10:05 AM  Result Value Ref Range   Color, UA     Clarity, UA     Glucose, UA Negative Negative   Bilirubin, UA     Ketones, UA neg    Spec Grav, UA     Blood, UA neg    pH, UA     POC,PROTEIN,UA Negative Negative, Trace, Small (1+), Moderate (2+), Large (3+), 4+   Urobilinogen, UA     Nitrite, UA neg    Leukocytes, UA Negative Negative   Appearance     Odor      NST: FHR baseline 150 bpm, Variability: moderate, Accelerations:present, Decelerations:  Absent= Cat 1/Reactive Toco: none   ASSESSMENT: G1P0000 at [redacted]w[redacted]d with CHTN NST reactive  PLAN: BP and EFM strip reviewed by Dr. Despina Hidden   Recommendations: Advised to increase Labetalol to 400mg  TID     08/07/2019 11:36 AM

## 2019-08-07 NOTE — Telephone Encounter (Signed)
Patient left message that wanted someone to explain the Group B Strep results to her.

## 2019-08-07 NOTE — Telephone Encounter (Signed)
Telephoned patient at home number. Patient states came back to the office and Tish explained Group B Strep.

## 2019-08-08 ENCOUNTER — Inpatient Hospital Stay (HOSPITAL_COMMUNITY)
Admission: AD | Admit: 2019-08-08 | Discharge: 2019-08-13 | DRG: 806 | Disposition: A | Discharge status: Home / Self Care | Payer: 59 | Attending: Family Medicine | Admitting: Family Medicine

## 2019-08-08 ENCOUNTER — Other Ambulatory Visit: Payer: 59

## 2019-08-08 ENCOUNTER — Encounter (HOSPITAL_COMMUNITY): Payer: Self-pay | Admitting: Family Medicine

## 2019-08-08 ENCOUNTER — Other Ambulatory Visit: Payer: Self-pay

## 2019-08-08 DIAGNOSIS — O1002 Pre-existing essential hypertension complicating childbirth: Secondary | ICD-10-CM | POA: Diagnosis present

## 2019-08-08 DIAGNOSIS — O9942 Diseases of the circulatory system complicating childbirth: Secondary | ICD-10-CM | POA: Diagnosis present

## 2019-08-08 DIAGNOSIS — F418 Other specified anxiety disorders: Secondary | ICD-10-CM | POA: Diagnosis present

## 2019-08-08 DIAGNOSIS — O99344 Other mental disorders complicating childbirth: Secondary | ICD-10-CM | POA: Diagnosis present

## 2019-08-08 DIAGNOSIS — N83201 Unspecified ovarian cyst, right side: Secondary | ICD-10-CM | POA: Diagnosis present

## 2019-08-08 DIAGNOSIS — O119 Pre-existing hypertension with pre-eclampsia, unspecified trimester: Secondary | ICD-10-CM

## 2019-08-08 DIAGNOSIS — O114 Pre-existing hypertension with pre-eclampsia, complicating childbirth: Principal | ICD-10-CM | POA: Diagnosis present

## 2019-08-08 DIAGNOSIS — B951 Streptococcus, group B, as the cause of diseases classified elsewhere: Secondary | ICD-10-CM | POA: Diagnosis present

## 2019-08-08 DIAGNOSIS — O99214 Obesity complicating childbirth: Secondary | ICD-10-CM | POA: Diagnosis present

## 2019-08-08 DIAGNOSIS — O99824 Streptococcus B carrier state complicating childbirth: Secondary | ICD-10-CM | POA: Diagnosis present

## 2019-08-08 DIAGNOSIS — O3483 Maternal care for other abnormalities of pelvic organs, third trimester: Secondary | ICD-10-CM | POA: Diagnosis present

## 2019-08-08 DIAGNOSIS — I471 Supraventricular tachycardia: Secondary | ICD-10-CM | POA: Diagnosis present

## 2019-08-08 DIAGNOSIS — Z3A37 37 weeks gestation of pregnancy: Secondary | ICD-10-CM

## 2019-08-08 DIAGNOSIS — Z8616 Personal history of COVID-19: Secondary | ICD-10-CM

## 2019-08-08 DIAGNOSIS — I10 Essential (primary) hypertension: Secondary | ICD-10-CM | POA: Diagnosis present

## 2019-08-08 NOTE — MAU Note (Signed)
PT SAYS YESTERDAY IN OFFICE 150/96.   TODAY AT HOME - 164/112.  HAS SLIGHT H/A - STARTED  AT 930PM- NO MEDS.  NO BLURRED VISION.  HAS RIGHT UPPER ABD PAIN- STARTED AT 930PM.  . PNC- WITH [facility].

## 2019-08-09 ENCOUNTER — Encounter (HOSPITAL_COMMUNITY): Payer: Self-pay | Admitting: Family Medicine

## 2019-08-09 ENCOUNTER — Inpatient Hospital Stay (HOSPITAL_COMMUNITY): Payer: 59 | Admitting: Anesthesiology

## 2019-08-09 ENCOUNTER — Other Ambulatory Visit: Payer: Self-pay

## 2019-08-09 DIAGNOSIS — Z8616 Personal history of COVID-19: Secondary | ICD-10-CM | POA: Diagnosis not present

## 2019-08-09 DIAGNOSIS — O114 Pre-existing hypertension with pre-eclampsia, complicating childbirth: Secondary | ICD-10-CM | POA: Diagnosis present

## 2019-08-09 DIAGNOSIS — Z3A37 37 weeks gestation of pregnancy: Secondary | ICD-10-CM | POA: Diagnosis not present

## 2019-08-09 DIAGNOSIS — N83201 Unspecified ovarian cyst, right side: Secondary | ICD-10-CM | POA: Diagnosis present

## 2019-08-09 DIAGNOSIS — Z362 Encounter for other antenatal screening follow-up: Secondary | ICD-10-CM

## 2019-08-09 DIAGNOSIS — I471 Supraventricular tachycardia: Secondary | ICD-10-CM | POA: Diagnosis present

## 2019-08-09 DIAGNOSIS — O1002 Pre-existing essential hypertension complicating childbirth: Secondary | ICD-10-CM | POA: Diagnosis present

## 2019-08-09 DIAGNOSIS — O98813 Other maternal infectious and parasitic diseases complicating pregnancy, third trimester: Secondary | ICD-10-CM | POA: Diagnosis not present

## 2019-08-09 DIAGNOSIS — O99214 Obesity complicating childbirth: Secondary | ICD-10-CM | POA: Diagnosis present

## 2019-08-09 DIAGNOSIS — O99344 Other mental disorders complicating childbirth: Secondary | ICD-10-CM | POA: Diagnosis present

## 2019-08-09 DIAGNOSIS — F418 Other specified anxiety disorders: Secondary | ICD-10-CM | POA: Diagnosis present

## 2019-08-09 DIAGNOSIS — O9942 Diseases of the circulatory system complicating childbirth: Secondary | ICD-10-CM | POA: Diagnosis present

## 2019-08-09 DIAGNOSIS — O3483 Maternal care for other abnormalities of pelvic organs, third trimester: Secondary | ICD-10-CM | POA: Diagnosis present

## 2019-08-09 DIAGNOSIS — O99824 Streptococcus B carrier state complicating childbirth: Secondary | ICD-10-CM | POA: Diagnosis present

## 2019-08-09 DIAGNOSIS — O10013 Pre-existing essential hypertension complicating pregnancy, third trimester: Secondary | ICD-10-CM | POA: Diagnosis not present

## 2019-08-09 DIAGNOSIS — O164 Unspecified maternal hypertension, complicating childbirth: Secondary | ICD-10-CM | POA: Diagnosis not present

## 2019-08-09 LAB — COMPREHENSIVE METABOLIC PANEL
ALT: 13 U/L (ref 0–44)
AST: 11 U/L — ABNORMAL LOW (ref 15–41)
Albumin: 2.6 g/dL — ABNORMAL LOW (ref 3.5–5.0)
Alkaline Phosphatase: 109 U/L (ref 38–126)
Anion gap: 9 (ref 5–15)
BUN: 8 mg/dL (ref 6–20)
CO2: 18 mmol/L — ABNORMAL LOW (ref 22–32)
Calcium: 9.3 mg/dL (ref 8.9–10.3)
Chloride: 107 mmol/L (ref 98–111)
Creatinine, Ser: 0.53 mg/dL (ref 0.44–1.00)
GFR calc Af Amer: >60 mL/min (ref >60)
GFR calc non Af Amer: >60 mL/min (ref >60)
Glucose, Bld: 103 mg/dL — ABNORMAL HIGH (ref 70–99)
Potassium: 3.8 mmol/L (ref 3.5–5.1)
Sodium: 134 mmol/L — ABNORMAL LOW (ref 135–145)
Total Bilirubin: 0.3 mg/dL (ref 0.3–1.2)
Total Protein: 6.3 g/dL — ABNORMAL LOW (ref 6.5–8.1)

## 2019-08-09 LAB — CBC
HCT: 35.7 % — ABNORMAL LOW (ref 36.0–46.0)
Hemoglobin: 12 g/dL (ref 12.0–15.0)
MCH: 26.8 pg (ref 26.0–34.0)
MCHC: 33.6 g/dL (ref 30.0–36.0)
MCV: 79.7 fL — ABNORMAL LOW (ref 80.0–100.0)
Platelets: 281 10*3/uL (ref 150–400)
RBC: 4.48 MIL/uL (ref 3.87–5.11)
RDW: 14.7 % (ref 11.5–15.5)
WBC: 14.3 10*3/uL — ABNORMAL HIGH (ref 4.0–10.5)
nRBC: 0 % (ref 0.0–0.2)

## 2019-08-09 LAB — TYPE AND SCREEN
ABO/RH(D): A POS
Antibody Screen: NEGATIVE

## 2019-08-09 LAB — PROTEIN / CREATININE RATIO, URINE
Creatinine, Urine: 154.07 mg/dL
Protein Creatinine Ratio: 0.12 mg/mg{Cre} (ref 0.00–0.15)
Total Protein, Urine: 18 mg/dL

## 2019-08-09 LAB — RPR: RPR Ser Ql: NONREACTIVE

## 2019-08-09 MED ORDER — FENTANYL CITRATE (PF) 100 MCG/2ML IJ SOLN
INTRAMUSCULAR | Status: AC
  Administered 2019-08-09: 100 ug
  Filled 2019-08-09: qty 2

## 2019-08-09 MED ORDER — TERBUTALINE SULFATE 1 MG/ML IJ SOLN: 0.2500 mg | Freq: Once | INTRAMUSCULAR | Status: DC | PRN

## 2019-08-09 MED ORDER — DIPHENHYDRAMINE HCL 50 MG/ML IJ SOLN: 12.5000 mg | INTRAMUSCULAR | Status: DC | PRN

## 2019-08-09 MED ORDER — HYDRALAZINE HCL 20 MG/ML IJ SOLN: 10.0000 mg | INTRAMUSCULAR | Status: DC | PRN

## 2019-08-09 MED ORDER — OXYTOCIN BOLUS FROM INFUSION: 500.0000 mL | Freq: Once | INTRAVENOUS | Status: DC

## 2019-08-09 MED ORDER — PHENYLEPHRINE 40 MCG/ML (10ML) SYRINGE FOR IV PUSH (FOR BLOOD PRESSURE SUPPORT): 80.0000 ug | PREFILLED_SYRINGE | INTRAVENOUS | Status: DC | PRN

## 2019-08-09 MED ORDER — ONDANSETRON HCL 4 MG/2ML IJ SOLN
4.0000 mg | Freq: Four times a day (QID) | INTRAMUSCULAR | Status: DC | PRN
  Administered 2019-08-09: 4 mg via INTRAVENOUS
  Filled 2019-08-09: qty 2

## 2019-08-09 MED ORDER — EPHEDRINE 5 MG/ML INJ: 10.0000 mg | INTRAVENOUS | Status: DC | PRN

## 2019-08-09 MED ORDER — MISOPROSTOL 50MCG HALF TABLET
50.0000 ug | ORAL_TABLET | ORAL | Status: DC
  Administered 2019-08-09 (×2): 50 ug via ORAL
  Filled 2019-08-09 (×2): qty 1

## 2019-08-09 MED ORDER — LABETALOL HCL 5 MG/ML IV SOLN: 80.0000 mg | INTRAVENOUS | Status: DC | PRN

## 2019-08-09 MED ORDER — LACTATED RINGERS IV SOLN: INTRAVENOUS | Status: DC

## 2019-08-09 MED ORDER — OXYTOCIN 40 UNITS IN NORMAL SALINE INFUSION - SIMPLE MED
INTRAVENOUS | Status: AC
  Filled 2019-08-09: qty 1000

## 2019-08-09 MED ORDER — OXYTOCIN 40 UNITS IN NORMAL SALINE INFUSION - SIMPLE MED: 2.5000 [IU]/h | INTRAVENOUS | Status: DC

## 2019-08-09 MED ORDER — OXYCODONE-ACETAMINOPHEN 5-325 MG PO TABS: 1.0000 | ORAL_TABLET | ORAL | Status: DC | PRN

## 2019-08-09 MED ORDER — LABETALOL HCL 5 MG/ML IV SOLN
20.0000 mg | INTRAVENOUS | Status: DC | PRN
  Administered 2019-08-09: 01:00:00 20 mg via INTRAVENOUS
  Filled 2019-08-09: qty 4

## 2019-08-09 MED ORDER — LACTATED RINGERS IV SOLN: 500.0000 mL | INTRAVENOUS | Status: DC | PRN

## 2019-08-09 MED ORDER — FENTANYL-BUPIVACAINE-NACL 0.5-0.125-0.9 MG/250ML-% EP SOLN
12.0000 mL/h | EPIDURAL | Status: DC | PRN
  Filled 2019-08-09 (×2): qty 250

## 2019-08-09 MED ORDER — FLEET ENEMA 7-19 GM/118ML RE ENEM: 1.0000 | ENEMA | RECTAL | Status: DC | PRN

## 2019-08-09 MED ORDER — OXYCODONE-ACETAMINOPHEN 5-325 MG PO TABS: 2.0000 | ORAL_TABLET | ORAL | Status: DC | PRN

## 2019-08-09 MED ORDER — ACETAMINOPHEN 500 MG PO TABS
1000.0000 mg | ORAL_TABLET | Freq: Once | ORAL | Status: AC
  Administered 2019-08-09: 1000 mg via ORAL
  Filled 2019-08-09: qty 2

## 2019-08-09 MED ORDER — ACETAMINOPHEN 325 MG PO TABS: 650.0000 mg | ORAL_TABLET | ORAL | Status: DC | PRN

## 2019-08-09 MED ORDER — FENTANYL-BUPIVACAINE-NACL 0.5-0.125-0.9 MG/250ML-% EP SOLN
12.0000 mL/h | EPIDURAL | Status: DC | PRN
  Filled 2019-08-09: qty 250

## 2019-08-09 MED ORDER — LABETALOL HCL 5 MG/ML IV SOLN: 40.0000 mg | INTRAVENOUS | Status: DC | PRN

## 2019-08-09 MED ORDER — DIPHENHYDRAMINE HCL 50 MG/ML IJ SOLN
INTRAMUSCULAR | Status: AC
  Administered 2019-08-09: 12.5 mg via INTRAVENOUS
  Filled 2019-08-09: qty 1

## 2019-08-09 MED ORDER — LACTATED RINGERS IV SOLN
500.0000 mL | Freq: Once | INTRAVENOUS | Status: AC
  Administered 2019-08-10: 500 mL via INTRAVENOUS

## 2019-08-09 MED ORDER — SODIUM CHLORIDE (PF) 0.9 % IJ SOLN
INTRAMUSCULAR | Status: DC | PRN
  Administered 2019-08-09: 12 mL/h via EPIDURAL

## 2019-08-09 MED ORDER — LABETALOL HCL 100 MG PO TABS
400.0000 mg | ORAL_TABLET | Freq: Once | ORAL | Status: AC
  Administered 2019-08-09: 400 mg via ORAL
  Filled 2019-08-09: qty 4

## 2019-08-09 MED ORDER — SOD CITRATE-CITRIC ACID 500-334 MG/5ML PO SOLN: 30.0000 mL | ORAL | Status: DC | PRN

## 2019-08-09 MED ORDER — FENTANYL CITRATE (PF) 100 MCG/2ML IJ SOLN: 100.0000 ug | INTRAMUSCULAR | Status: DC | PRN

## 2019-08-09 MED ORDER — OXYTOCIN 40 UNITS IN NORMAL SALINE INFUSION - SIMPLE MED
1.0000 m[IU]/min | INTRAVENOUS | Status: DC
  Administered 2019-08-09 – 2019-08-10 (×2): 2 m[IU]/min via INTRAVENOUS
  Filled 2019-08-09: qty 1000

## 2019-08-09 MED ORDER — PENICILLIN G POT IN DEXTROSE 60000 UNIT/ML IV SOLN: 3.0000 10*6.[IU] | INTRAVENOUS | Status: DC

## 2019-08-09 MED ORDER — LIDOCAINE HCL (PF) 1 % IJ SOLN
INTRAMUSCULAR | Status: DC | PRN
  Administered 2019-08-09 (×2): 5 mL via EPIDURAL

## 2019-08-09 MED ORDER — LIDOCAINE HCL (PF) 1 % IJ SOLN: 30.0000 mL | INTRAMUSCULAR | Status: DC | PRN

## 2019-08-09 MED ORDER — SODIUM CHLORIDE 0.9 % IV SOLN
5.0000 10*6.[IU] | Freq: Once | INTRAVENOUS | Status: AC
  Administered 2019-08-09: 5 10*6.[IU] via INTRAVENOUS
  Filled 2019-08-09: qty 5

## 2019-08-09 MED ORDER — LACTATED RINGERS IV SOLN: 500.0000 mL | Freq: Once | INTRAVENOUS | Status: DC

## 2019-08-09 MED ORDER — PENICILLIN G POT IN DEXTROSE 60000 UNIT/ML IV SOLN
3.0000 10*6.[IU] | INTRAVENOUS | Status: DC
  Administered 2019-08-09 – 2019-08-11 (×11): 3 10*6.[IU] via INTRAVENOUS
  Filled 2019-08-09 (×11): qty 50

## 2019-08-09 NOTE — Anesthesia Procedure Notes (Signed)
Epidural Patient location during procedure: OB  Staffing Anesthesiologist: Lucretia Kern, MD Performed: anesthesiologist   Preanesthetic Checklist Completed: patient identified, IV checked, risks and benefits discussed, monitors and equipment checked, pre-op evaluation and timeout performed  Epidural Patient position: sitting Prep: DuraPrep Patient monitoring: heart rate, continuous pulse ox and blood pressure Approach: midline Injection technique: LOR air  Needle:  Needle type: Tuohy  Needle gauge: 17 G Needle length: 15 cm Needle insertion depth: 12 cm Catheter type: closed end flexible Catheter size: 19 Gauge Catheter at skin depth: 18 cm Test dose: negative  Assessment Events: blood not aspirated, injection not painful, no injection resistance, no paresthesia and negative IV test  Additional Notes Reason for block:procedure for pain

## 2019-08-09 NOTE — MAU Provider Note (Signed)
Chief Complaint  Patient presents with  . Headache  . Hypertension     First Provider Initiated Contact with Patient 08/09/19 0015      S: Rebecca Gardner  is a 24 y.o. y.o. year old G8P0000 female at [redacted]w[redacted]d weeks gestation who presents to MAU with elevated blood pressures. Hx of hypertension. Current blood pressure medication: labetalol 400 mg TID. Last dose was taken at noon today.  Reports headache & RUQ pain all day today. Rates headache 4/10; frontal; hasn't treated. Her BP at home was 160s/100s so she was instructed to come to MAU for evaluation.   Associated symptoms: endorses Headache, denies vision changes, endorses epigastric pain Contractions: denies Vaginal bleeding: denies Fetal movement: good  O:  Patient Vitals for the past 24 hrs:  BP Temp Temp src Pulse Resp Height Weight  08/09/19 0146 (!) 141/91 -- -- (!) 113 -- -- --  08/09/19 0140 (!) 149/99 -- -- (!) 101 -- -- --  08/09/19 0113 (!) 180/128 -- -- -- -- -- --  08/09/19 0101 (!) 180/136 -- -- 98 -- -- --  08/09/19 0046 (!) 164/96 -- -- (!) 138 -- -- --  08/09/19 0031 (!) 152/102 -- -- -- -- -- --  08/09/19 0023 (!) 142/89 -- -- (!) 116 -- -- --  08/09/19 0001 (!) 158/104 -- -- (!) 103 -- -- --  08/08/19 2356 (!) 154/110 -- -- (!) 128 -- -- --  08/08/19 2335 134/88 97.9 F (36.6 C) Oral (!) 112 20 5\' 8"  (1.727 m) (!) 155.4 kg   General: NAD Heart: Regular rate Lungs: Normal rate and effort Abd: Soft, NT, Gravid, S=D Extremities: Non pitting Pedal edema Neuro: 2+ deep tendon reflexes, No clonus  NST:  Baseline: 140 bpm, Variability: Good {> 6 bpm), Accelerations: Reactive and Decelerations: Absent  Results for orders placed or performed during the hospital encounter of 08/08/19 (from the past 24 hour(s))  Protein / creatinine ratio, urine     Status: None   Collection Time: 08/09/19 12:01 AM  Result Value Ref Range   Creatinine, Urine 154.07 mg/dL   Total Protein, Urine 18 mg/dL   Protein Creatinine Ratio  0.12 0.00 - 0.15 mg/mg[Cre]  CBC     Status: Abnormal   Collection Time: 08/09/19 12:14 AM  Result Value Ref Range   WBC 14.3 (H) 4.0 - 10.5 K/uL   RBC 4.48 3.87 - 5.11 MIL/uL   Hemoglobin 12.0 12.0 - 15.0 g/dL   HCT 35.7 (L) 36.0 - 46.0 %   MCV 79.7 (L) 80.0 - 100.0 fL   MCH 26.8 26.0 - 34.0 pg   MCHC 33.6 30.0 - 36.0 g/dL   RDW 14.7 11.5 - 15.5 %   Platelets 281 150 - 400 K/uL   nRBC 0.0 0.0 - 0.2 %  Comprehensive metabolic panel     Status: Abnormal   Collection Time: 08/09/19 12:14 AM  Result Value Ref Range   Sodium 134 (L) 135 - 145 mmol/L   Potassium 3.8 3.5 - 5.1 mmol/L   Chloride 107 98 - 111 mmol/L   CO2 18 (L) 22 - 32 mmol/L   Glucose, Bld 103 (H) 70 - 99 mg/dL   BUN 8 6 - 20 mg/dL   Creatinine, Ser 0.53 0.44 - 1.00 mg/dL   Calcium 9.3 8.9 - 10.3 mg/dL   Total Protein 6.3 (L) 6.5 - 8.1 g/dL   Albumin 2.6 (L) 3.5 - 5.0 g/dL   AST 11 (L) 15 - 41 U/L  ALT 13 0 - 44 U/L   Alkaline Phosphatase 109 38 - 126 U/L   Total Bilirubin 0.3 0.3 - 1.2 mg/dL   GFR calc non Af Amer >60 >60 mL/min   GFR calc Af Amer >60 >60 mL/min   Anion gap 9 5 - 15    MDM Tylenol 1gm PO - no improvement in headache Night dose of oral labetalol given  Severe range pressures - switched out BP cuffs for appropriate size & still had severe range BP with manual cuff. IV antihypertensive protocol initiated & BP improved after 1 dose of IV labetalol 20 mg.   PEC labs normal  Discussed with patient - will admit to birthing suites.   Pt informed that the ultrasound is considered a limited OB ultrasound and is not intended to be a complete ultrasound exam.  Patient also informed that the ultrasound is not being completed with the intent of assessing for fetal or placental anomalies or any pelvic abnormalities.  Explained that the purpose of today's ultrasound is to assess for  presentation.  Patient acknowledges the purpose of the exam and the limitations of the study.  cephalic    A:  1.  Chronic hypertension with superimposed preeclampsia   2. Positive GBS test   3. [redacted] weeks gestation of pregnancy    P:  Admit to birthing suites per consult with Dr. Adrian Blackwater GBS positive (NKDA) Care turned over to labor team  Judeth Horn, NP 08/09/2019 2:02 AM

## 2019-08-09 NOTE — H&P (Signed)
Rebecca Gardner is a 24 y.o. female G1P0 at [redacted]w[redacted]d weeks gestation who presents to MAU with elevated blood pressures. Hx of hypertension. Current blood pressure medication: labetalol 400 mg TID. Last dose was taken at noon today.  Reports headache & RUQ pain all day today. Rates headache 4/10; frontal; hasn't treated. Her BP at home was 160s/100s so she was instructed to come to MAU for evaluation.   Associated symptoms: endorses Headache, denies vision changes, endorses epigastric pain Contractions: denies Vaginal bleeding: denies Fetal movement: good  OB History    Gravida  1   Para  0   Term  0   Preterm  0   AB  0   Living  0     SAB  0   TAB  0   Ectopic  0   Multiple  0   Live Births             Past Medical History:  Diagnosis Date  . Acute lateral meniscus tear of left knee 02/02/2014  . Acute medial meniscus tear of left knee   . Concussion with brief (less than one hour) loss of consciousness 4-28 15  . Depression   . Gall stones 03/27/2016  . Hypertension   . Nephrolithiasis    Past Surgical History:  Procedure Laterality Date  . CHOLECYSTECTOMY N/A 04/10/2016   Procedure: LAPAROSCOPIC CHOLECYSTECTOMY;  Surgeon: Rodman Pickle, MD;  [facility];  Service: General;  Laterality: N/A;  . KNEE ARTHROSCOPY WITH LATERAL MENISECTOMY Left 02/02/2014   Procedure: LEFT KNEE ARTHROSCOPY WITH LATERAL MENISECTOMY;  Surgeon: Eulas Post, MD;  Location: [facility];  Service: Orthopedics;  Laterality: Left;  . TONSILLECTOMY     Family History: family history includes Atrial fibrillation in her father; Breast cancer in her paternal grandmother; COPD in her maternal uncle; Cancer in her paternal grandfather; Healthy in her mother; Migraines in her maternal uncle; Prostate cancer in her paternal grandfather. Social History:  reports that she has never smoked. She has never used smokeless tobacco. She reports that she does not drink alcohol or  use drugs.     Maternal Diabetes: No Genetic Screening: Declined Maternal Ultrasounds/Referrals: Normal Fetal Ultrasounds or other Referrals:  None Maternal Substance Abuse:  No Significant Maternal Medications:  Meds include: Other: Labetalol Significant Maternal Lab Results:  Group B Strep positive Other Comments:  severe preeclampsia  Review of Systems  Constitutional: Negative for chills and fever.  Eyes: Negative for visual disturbance.  Respiratory: Negative for shortness of breath.   Gastrointestinal: Negative for abdominal pain, constipation, diarrhea and nausea.  Genitourinary: Negative for dysuria and vaginal bleeding.  Musculoskeletal: Negative for back pain.  Neurological: Positive for headaches. Negative for weakness.   Maternal Medical History:  Reason for admission: Nausea. Elevated blood pressures   Contractions: Onset was 1-2 hours ago.   Frequency: irregular.   Perceived severity is mild.    Fetal activity: Perceived fetal activity is normal.   Last perceived fetal movement was within the past hour.    Prenatal complications: PIH and pre-eclampsia.   No bleeding.   Prenatal Complications - Diabetes: none.    Dilation: 1 Effacement (%): Thick Station: Ballotable Exam by:: Wynelle Bourgeois CNM  Blood pressure (!) 141/91, pulse (!) 113, temperature 97.9 F (36.6 C), temperature source Oral, resp. rate 20, height 5\' 8"  (1.727 m), weight (!) 155.4 kg, last menstrual period 11/22/2018. Maternal Exam:  Uterine Assessment: Contraction strength is mild.  Contraction frequency is  irregular.   Abdomen: Patient reports no abdominal tenderness. Estimated fetal weight is difficult to estimate due to habitus.   Fetal presentation: vertex  Introitus: Normal vulva. Normal vagina.  Vagina is negative for discharge.  Ferning test: not done.  Nitrazine test: not done. Amniotic fluid character: not assessed.  Pelvis: adequate for delivery.   Cervix: Cervix  evaluated by digital exam.     Fetal Exam Fetal Monitor Review: Mode: ultrasound.   Baseline rate: 140.  Variability: moderate (6-25 bpm).   Pattern: accelerations present and no decelerations.    Fetal State Assessment: Category I - tracings are normal.     Physical Exam  Constitutional: She is oriented to person, place, and time. She appears well-developed and well-nourished.  HENT:  Head: Normocephalic.  Cardiovascular: Normal rate and regular rhythm.  Respiratory: Effort normal. No respiratory distress.  GI: Soft. She exhibits no distension. There is no abdominal tenderness. There is no rebound and no guarding.  Genitourinary:    Vulva and vagina normal.     No vaginal discharge.     Genitourinary Comments: Dilation: 1 Effacement (%): Thick Station: Ballotable Presentation: Vertex Exam by:: Hansel Feinstein CNM     Musculoskeletal:        General: Edema (1+) present. Normal range of motion.     Cervical back: Normal range of motion and neck supple.  Neurological: She is alert and oriented to person, place, and time. She displays normal reflexes. She exhibits normal muscle tone.  Skin: Skin is warm and dry.  Psychiatric: She has a normal mood and affect.    Prenatal labs: ABO, Rh: --/--/A POS (03/03 0014) Antibody: NEG (03/03 0014) Rubella: 5.42 (09/14 1046) RPR: Non Reactive (01/04 0831)  HBsAg: Negative (09/14 1046)  HIV: Non Reactive (01/04 0831)  GBS: Positive/-- (02/24 0000)   Results for orders placed or performed during the hospital encounter of 08/08/19 (from the past 24 hour(s))  Protein / creatinine ratio, urine     Status: None   Collection Time: 08/09/19 12:01 AM  Result Value Ref Range   Creatinine, Urine 154.07 mg/dL   Total Protein, Urine 18 mg/dL   Protein Creatinine Ratio 0.12 0.00 - 0.15 mg/mg[Cre]  CBC     Status: Abnormal   Collection Time: 08/09/19 12:14 AM  Result Value Ref Range   WBC 14.3 (H) 4.0 - 10.5 K/uL   RBC 4.48 3.87 - 5.11  MIL/uL   Hemoglobin 12.0 12.0 - 15.0 g/dL   HCT 35.7 (L) 36.0 - 46.0 %   MCV 79.7 (L) 80.0 - 100.0 fL   MCH 26.8 26.0 - 34.0 pg   MCHC 33.6 30.0 - 36.0 g/dL   RDW 14.7 11.5 - 15.5 %   Platelets 281 150 - 400 K/uL   nRBC 0.0 0.0 - 0.2 %  Comprehensive metabolic panel     Status: Abnormal   Collection Time: 08/09/19 12:14 AM  Result Value Ref Range   Sodium 134 (L) 135 - 145 mmol/L   Potassium 3.8 3.5 - 5.1 mmol/L   Chloride 107 98 - 111 mmol/L   CO2 18 (L) 22 - 32 mmol/L   Glucose, Bld 103 (H) 70 - 99 mg/dL   BUN 8 6 - 20 mg/dL   Creatinine, Ser 0.53 0.44 - 1.00 mg/dL   Calcium 9.3 8.9 - 10.3 mg/dL   Total Protein 6.3 (L) 6.5 - 8.1 g/dL   Albumin 2.6 (L) 3.5 - 5.0 g/dL   AST 11 (L) 15 - 41 U/L  ALT 13 0 - 44 U/L   Alkaline Phosphatase 109 38 - 126 U/L   Total Bilirubin 0.3 0.3 - 1.2 mg/dL   GFR calc non Af Amer >60 >60 mL/min   GFR calc Af Amer >60 >60 mL/min   Anion gap 9 5 - 15  Type and screen [facility]     Status: None   Collection Time: 08/09/19 12:14 AM  Result Value Ref Range   ABO/RH(D) A POS    Antibody Screen NEG    Sample Expiration      08/12/2019,2359 Performed at [facility], [address], [place], [place] [address]     Assessment/Plan: SIngle intrauterine pregnancy at [redacted]w[redacted]d Chronic hypertension with superimposed preeclampsia Group B strep positive  Admit to Labor and Delivery Routine orders Attempted foley unsuccessfully WIll give Cytotec PO q 4 hrs If has more severe range BPs will start Magnesium sulfate infusion    Wynelle Bourgeois 08/09/2019, 2:52 AM

## 2019-08-09 NOTE — Progress Notes (Signed)
Patient ID: Rebecca Gardner, female   DOB: 23-May-1996, 24 y.o.   MRN: [id number] Requesting pain med Pain is mostly in her back  Vitals:   08/09/19 0259 08/09/19 0301 08/09/19 0306 08/09/19 0309  BP:   (!) 82/58 128/74  Pulse:   (!) 108 (!) 103  Resp:    18  Temp:  97.9 F (36.6 C)    TempSrc:  Oral    Weight: (!) 155.4 kg     Height: 5\' 8"  (1.727 m)      FHR and UCs tracing intermittently  ?uterine irritability  Will given some Fentanyl and try to get monitors applied better

## 2019-08-09 NOTE — Progress Notes (Signed)
RN at bedside. Assessing FHR. Adjusting Phillip POD monitor for tracing.

## 2019-08-09 NOTE — Progress Notes (Signed)
Patient ID: Rebecca Gardner, female   DOB: January 21, 1996, 24 y.o.   MRN: [id number]  Peeked in room- pt appears to be resting, not disturbed  BP 123/64, 118/68, P low 100s FHR 140s, +accels, no decels, occ mi variables Ctx q 2-3 mins with Pit at 40mu/min Cx deferred  IUP@37 .1wks cHTN IOL process  Keep ctx reg with Pit; will reexamine later to determine progress and plan of care  [name] CNM 08/09/2019 10:19 PM

## 2019-08-09 NOTE — Anesthesia Preprocedure Evaluation (Signed)
Anesthesia Evaluation  Patient identified by MRN, date of birth, ID band Patient awake    Reviewed: Allergy & Precautions, H&P , NPO status , Patient's Chart, lab work & pertinent test results  History of Anesthesia Complications Negative for: history of anesthetic complications  Airway Mallampati: II  TM Distance: >3 FB Neck ROM: full    Dental no notable dental hx.    Pulmonary neg pulmonary ROS,    Pulmonary exam normal        Cardiovascular hypertension, Normal cardiovascular exam Rhythm:regular Rate:Normal     Neuro/Psych negative neurological ROS  negative psych ROS   GI/Hepatic Neg liver ROS, GERD  ,  Endo/Other  Morbid obesity  Renal/GU      Musculoskeletal   Abdominal   Peds  Hematology negative hematology ROS (+)   Anesthesia Other Findings   Reproductive/Obstetrics (+) Pregnancy                             Anesthesia Physical Anesthesia Plan  ASA: III  Anesthesia Plan: Epidural   Post-op Pain Management:    Induction:   PONV Risk Score and Plan:   Airway Management Planned:   Additional Equipment:   Intra-op Plan:   Post-operative Plan:   Informed Consent: I have reviewed the patients History and Physical, chart, labs and discussed the procedure including the risks, benefits and alternatives for the proposed anesthesia with the patient or authorized representative who has indicated his/her understanding and acceptance.       Plan Discussed with:   Anesthesia Plan Comments:         Anesthesia Quick Evaluation

## 2019-08-09 NOTE — Progress Notes (Signed)
Patient ID: Rebecca Gardner, female   DOB: 10-29-1995, 24 y.o.   MRN: [id number]  Comfortable w/ epidural; s/p cervical foley and cytotec x 2 doses; Pit started a little before 1800; receiving 2nd dose of PCN  BP 124/57, 135/66, P 100s FHR 130s, +accels, no decels Ctx not tracing; Pit on 62mu/min Cx deferred (was 3/50/vtx -2 at start of Pit)  IUP@  37.1wks cHTN- earlier concern for SIPE, but BPs are more stable now IOL process GBS pos  Will titrate Pit to achieve active labor; could potentially stop Pit after a trial and if no significant change, go back to cytotec  [name] CNM 08/09/2019 7:13 PM

## 2019-08-09 NOTE — Progress Notes (Signed)
Labor Progress Note Rebecca Gardner is a 24 y.o. G1P0000 at [redacted]w[redacted]d presented for chronic HTN with superimposed PreE  S: Patient feeling better now that she has an epidural placed  O:  BP (!) 106/42   Pulse 97   Temp 98.8 F (37.1 C) (Oral)   Resp 18   Ht 5\' 8"  (1.727 m)   Wt (!) 155.4 kg   LMP 11/22/2018 (Approximate)   SpO2 98%   BMI 52.09 kg/m   CVE: Dilation: 1.5 Effacement (%): Thick Station: -3 Presentation: Vertex Exam by:: Eckstat   A&P: 24 y.o. G1P0000 [redacted]w[redacted]d #Labor: Progressing well. Foley bulb placed @0925  #Pain: Epidural placed #FWB: Category 1. Moderate variability. Baseline 135. +Accels. -Decels. q2-3 contractions #GBS positive, on PCN #Chronic HTN with superimposed PreE: blood pressures have been normal since ~0700. Received one dose of labetalol 400mg  @ 0034  , MD 9:29 AM

## 2019-08-09 NOTE — Progress Notes (Addendum)
LABOR PROGRESS NOTE  Rebecca Gardner is a 24 y.o. G1P0000 at [redacted]w[redacted]d  admitted for IOL for chronic HTN with superimposed PreE.  Subjective: Patient doing well. Able to nap earlier after the epidural was placed. Not currently feeling the contractions.   Objective: BP (!) 106/42   Pulse 97   Temp 98.8 F (37.1 C) (Oral)   Resp 18   Ht 5\' 8"  (1.727 m)   Wt (!) 155.4 kg   LMP 11/22/2018 (Approximate)   SpO2 98%   BMI 52.09 kg/m  or  Vitals:   08/09/19 0837 08/09/19 0839 08/09/19 0844 08/09/19 0845  BP: (!) 103/58  (!) 100/39 (!) 106/42  Pulse: (!) 112  (!) 103 97  Resp:   18   Temp:      TempSrc:      SpO2:  99%  98%  Weight:      Height:        Cervical exam not performed at this time due to foley bulb placement. Foley bulb still in place.  Dilation: 1.5 Effacement (%): Thick Station: -3 Presentation: Vertex Exam by:: [email or web address] FHT: baseline rate 145, moderate varibility, +acel, -decel Toco: q2-3  Labs: Lab Results  Component Value Date   WBC 14.3 (H) 08/09/2019   HGB 12.0 08/09/2019   HCT 35.7 (L) 08/09/2019   MCV 79.7 (L) 08/09/2019   PLT 281 08/09/2019    Patient Active Problem List   Diagnosis Date Noted  . Chronic hypertension with superimposed preeclampsia 08/09/2019  . Cellulitis of umbilicus 07/20/2019  . COVID-19 07/13/2019  . Supraventricular tachycardia during pregnancy (HCC) 07/05/2019  . Benign hypertension 05/22/2019  . Supervision of high risk pregnancy, antepartum 02/20/2019  . Right ovarian cyst 02/20/2019  . 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 Depression with anxiety 02/05/2016  . Cephalalgia 02/05/2016  . Obesity 07/30/2015    Assessment / Plan: 24 y.o. G1P0000 at [redacted]w[redacted]d here for IOL for chronic HTN with superimposed PreE.  Labor: Progressing well. Foley bulb placed @0925 . Still in place on recheck. PO Cytotec 50mg  given x2 [redacted]w[redacted]d, ). Will just continue with foley bulb without more Cytotec at this time given frequency contractions.   Fetal  Wellbeing:  Category 1 Pain Control:  Epidural GBS: positive. Start penicillin at this time. Anticipated MOD:  NSVD Chronic HTN with superimposed PreE: BP continues to be well controlled  , MD OB Resident 08/09/2019, 1:21 PM

## 2019-08-09 NOTE — Progress Notes (Signed)
LABOR PROGRESS NOTE  Rebecca Gardner is a 24 y.o. G1P0000 at [redacted]w[redacted]d  admitted for IOL for chronic HTN with superimposed PreE.  Subjective: Patient doing well. Can feel more pressure   Objective: BP (!) 106/42   Pulse 97   Temp 98.8 F (37.1 C) (Oral)   Resp 18   Ht 5\' 8"  (1.727 m)   Wt (!) 155.4 kg   LMP 11/22/2018 (Approximate)   SpO2 98%   BMI 52.09 kg/m  or  Vitals:   08/09/19 0837 08/09/19 0839 08/09/19 0844 08/09/19 0845  BP: (!) 103/58  (!) 100/39 (!) 106/42  Pulse: (!) 112  (!) 103 97  Resp:   18   Temp:      TempSrc:      SpO2:  99%  98%  Weight:      Height:        Cervical exam not performed at this time due to foley bulb placement. Foley bulb still in place.  Dilation: 1.5 Effacement (%): Thick Station: -3 Presentation: Vertex Exam by:: [email or web address] FHT: baseline rate 140, moderate varibility, +acel, -decel Toco: q2-3  Labs: Lab Results  Component Value Date   WBC 14.3 (H) 08/09/2019   HGB 12.0 08/09/2019   HCT 35.7 (L) 08/09/2019   MCV 79.7 (L) 08/09/2019   PLT 281 08/09/2019    Patient Active Problem List   Diagnosis Date Noted  . Chronic hypertension with superimposed preeclampsia 08/09/2019  . Cellulitis of umbilicus 07/20/2019  . COVID-19 07/13/2019  . Supraventricular tachycardia during pregnancy (HCC) 07/05/2019  . Benign hypertension 05/22/2019  . Supervision of high risk pregnancy, antepartum 02/20/2019  . Right ovarian cyst 02/20/2019  . 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 Depression with anxiety 02/05/2016  . Cephalalgia 02/05/2016  . Obesity 07/30/2015    Assessment / Plan: 24 y.o. G1P0000 at [redacted]w[redacted]d here for IOL for chronic HTN with superimposed PreE.  Labor: Foley bulb placed @0925 . Still in place on recheck. PO Cytotec 50mg  given x2 [redacted]w[redacted]d, ). Continue foley bulb and start pitocin.   Fetal Wellbeing:  Category 1 Pain Control:  Epidural GBS: positive. Start penicillin at this time. Anticipated MOD:  NSVD Chronic HTN with superimposed  PreE: BP continues to be well controlled  , MD OB Resident 08/09/2019, 5:25 PM

## 2019-08-10 ENCOUNTER — Encounter: Payer: 59 | Admitting: Women's Health

## 2019-08-10 ENCOUNTER — Other Ambulatory Visit: Payer: 59

## 2019-08-10 MED ORDER — MISOPROSTOL 25 MCG QUARTER TABLET
ORAL_TABLET | ORAL | Status: AC
  Administered 2019-08-10: 25 ug via VAGINAL
  Filled 2019-08-10: qty 1

## 2019-08-10 MED ORDER — MISOPROSTOL 50MCG HALF TABLET
50.0000 ug | ORAL_TABLET | Freq: Once | ORAL | Status: AC
  Administered 2019-08-10: 50 ug via BUCCAL
  Filled 2019-08-10: qty 1

## 2019-08-10 MED ORDER — LABETALOL HCL 200 MG PO TABS
400.0000 mg | ORAL_TABLET | Freq: Once | ORAL | Status: AC
  Administered 2019-08-10: 400 mg via ORAL
  Filled 2019-08-10: qty 2

## 2019-08-10 MED ORDER — MISOPROSTOL 25 MCG QUARTER TABLET
25.0000 ug | ORAL_TABLET | ORAL | Status: DC
  Administered 2019-08-10: 25 ug via VAGINAL
  Filled 2019-08-10: qty 1

## 2019-08-10 NOTE — Progress Notes (Signed)
Patient ID: Rebecca Gardner, female   DOB: 04-27-1996, 24 y.o.   MRN: [id number]  Sleeping with epidural; comfortable; Pit is on 29mu/min  BPs 112/70, 102/52, 99/47, P 90 FHR 130s, +accels, no decels, Cat 1 Ctx irreg Cx 3+/50/vtx-3/med to firm consistency  IUP@37 .2wks cHTN Cx unfavorable  Pitocin stopped and vag cytotec placed; will reevaluate and either place a 2nd dose in 4hrs or restart the Pitocin  [name] CNM 08/10/2019 2:23 AM

## 2019-08-10 NOTE — Progress Notes (Signed)
Patient Vitals for the past 4 hrs:  BP Temp Temp src Pulse Resp  08/10/19 2201 (!) 149/102 - - (!) 112 -  08/10/19 2146 (!) 140/97 - - (!) 108 -  08/10/19 2133 (!) 156/107 - - (!) 105 -  08/10/19 2101 124/82 - - (!) 109 18  08/10/19 2031 131/72 - - (!) 101 16  08/10/19 2001 125/74 - - (!) 108 16  08/10/19 1931 124/81 - - (!) 106 18  08/10/19 1901 (!) 138/97 - - (!) 127 20  08/10/19 1850 - 98.6 F (37 C) Axillary - -  08/10/19 1848 136/82 - - (!) 128 16  08/10/19 1831 130/81 - - (!) 120 15   Comfortable w/epidural.  Ctx irregular.  Has had runs of q 2-3 minutes w/spacing out to q 4-5 minutes at times. FHR Cat 1.  Cx 4/40/-2.  Pitocin at 20 mu/min.   A:  IOL for CHTN. PO labetalol had been held d/t low BPs, will restart 400mg  PO now Because she has had improving response to pitocin, will keep going (max 36 mu/min per Dr . ).  If cx doesn't start to thin out, would go back to cytotec rather than ROM.

## 2019-08-10 NOTE — Progress Notes (Signed)
Labor Progress Note Rebecca Gardner is a 24 y.o. G1P0000 at [redacted]w[redacted]d presented for IOL for cHTN S:  No complaints. O:  BP 131/72   Pulse (!) 101   Temp 98.6 F (37 C) (Axillary)   Resp 16   Ht 5\' 8"  (1.727 m)   Wt (!) 155.4 kg   LMP 11/22/2018 (Approximate)   SpO2 98%   BMI 52.09 kg/m  EFM: 145/moderate/acels present  CVE: Dilation: 4 Effacement (%): 40, 50 Cervical Position: Middle Station: -3, -2 Presentation: Vertex Exam by:: [email or web address], RN and Dr. [name]   A&P: 24 y.o. G1P0000 [redacted]w[redacted]d for IOL for cHTN #Labor: Slow progression. Most recent pit restarted at 1500 #Pain: Epidural #FWB: Cat 1 #GBS positive #cHTN - most recent BP 131/72, continue to monitor with labetalol PRN  [redacted]w[redacted]d, DO 8:49 PM

## 2019-08-10 NOTE — Progress Notes (Signed)
Labor Progress Note Rebecca Gardner is a 24 y.o. G1P0000 at [redacted]w[redacted]d presented for IOL cHTN. S: Comfortable with epidural.  O:  BP 137/85   Pulse (!) 103   Temp 97.9 F (36.6 C) (Oral)   Resp 16   Ht 5\' 8"  (1.727 m)   Wt (!) 155.4 kg   LMP 11/22/2018 (Approximate)   SpO2 98%   BMI 52.09 kg/m  EFM: 140, moderate variability, pos accels, no decels, reactive TOCO: unable to trace currently  CVE: Dilation: 3.5 Effacement (%): 50 Cervical Position: Posterior Station: -3 Presentation: Vertex Exam by:: [email or web address] RN   A&P: 24 y.o. G1P0000 [redacted]w[redacted]d here for IOL for cHTN. #Labor: Progressing well. S/p Cytotec and FB. Pit started and then another Cytotec given due to cervical thickness. Will recheck. Consider AROM/Pit as indicated. Anticipate SVD. #Pain: epidural #FWB: Cat I #GBS positive; PCN #cHTN: Was on Labetalol 200 mg BID. Now normal BP's. Pr/Cr and CMP normal. Earlier concern for super-imposed Pre-E but none currently. Asymptomatic. Cont to monitor.   [redacted]w[redacted]d, MD 8:53 AM

## 2019-08-10 NOTE — Progress Notes (Signed)
Labor Progress Note Rebecca Gardner is a 24 y.o. G1P0000 at [redacted]w[redacted]d presented for IOL cHTN. S: Comfortable with epidural. No concerns  O:  BP 136/90   Pulse (!) 102   Temp 98.7 F (37.1 C) (Oral)   Resp 18   Ht 5\' 8"  (1.727 m)   Wt (!) 155.4 kg   LMP 11/22/2018 (Approximate)   SpO2 98%   BMI 52.09 kg/m  EFM: 140, moderate variability, pos accels, no decels, reactive TOCO: unable to trace currently  CVE: Dilation: 4 Effacement (%): 40, 50 Cervical Position: Middle Station: -3, -2 Presentation: Vertex Exam by:: [email or web address], RN and Dr. [name]   A&P: 24 y.o. G1P0000 [redacted]w[redacted]d here for IOL for cHTN. #Labor: Slow progression. S/p Cytotec and FB. Pit started and then another Cytotec given due to cervical thickness. Buccal Cytotec given at 1050. Cervical recheck showed some change including the head being more engaged. Will restart pit now and consider AROM as indicated after next cervical check. Anticipate SVD. #Pain: epidural #FWB: Cat I #GBS positive; PCN #cHTN: Was on Labetalol 200 mg BID. Now normal BP's. Pr/Cr and CMP normal. Earlier concern for super-imposed Pre-E but none currently. Asymptomatic. Cont to monitor.   [redacted]w[redacted]d, MD 3:00 PM

## 2019-08-10 NOTE — Progress Notes (Signed)
Labor Progress Note Rebecca Gardner is a 24 y.o. G1P0000 at [redacted]w[redacted]d presented for IOL cHTN. S: Comfortable with epidural.  O:  BP 122/72   Pulse 94   Temp 97.9 F (36.6 C) (Oral)   Resp 18   Ht 5\' 8"  (1.727 m)   Wt (!) 155.4 kg   LMP 11/22/2018 (Approximate)   SpO2 98%   BMI 52.09 kg/m  EFM: 140, moderate variability, pos accels, no decels, reactive TOCO: unable to trace currently  CVE: Dilation: 4 Effacement (%): 50 Cervical Position: Posterior Station: -3, -2 Presentation: Vertex Exam by:: [email or web address],    A&P: 24 y.o. G1P0000 [redacted]w[redacted]d here for IOL for cHTN. #Labor: Progressing well. S/p Cytotec and FB. Pit started and then another Cytotec given due to cervical thickness. Last cytotec at 0615. Still not making much change. Plan to give a buccal Cytotec. Consider AROM/Pit as indicated. Anticipate SVD. #Pain: epidural #FWB: Cat I #GBS positive; PCN #cHTN: Was on Labetalol 200 mg BID. Now normal BP's. Pr/Cr and CMP normal. Earlier concern for super-imposed Pre-E but none currently. Asymptomatic. Cont to monitor.   [redacted]w[redacted]d, MD 10:28 AM

## 2019-08-11 ENCOUNTER — Encounter (HOSPITAL_COMMUNITY): Payer: Self-pay | Admitting: Family Medicine

## 2019-08-11 DIAGNOSIS — O98813 Other maternal infectious and parasitic diseases complicating pregnancy, third trimester: Secondary | ICD-10-CM

## 2019-08-11 DIAGNOSIS — B951 Streptococcus, group B, as the cause of diseases classified elsewhere: Secondary | ICD-10-CM | POA: Diagnosis present

## 2019-08-11 LAB — CBC WITH DIFFERENTIAL/PLATELET
Abs Immature Granulocytes: 0.11 10*3/uL — ABNORMAL HIGH (ref 0.00–0.07)
Basophils Absolute: 0 10*3/uL (ref 0.0–0.1)
Basophils Relative: 0 %
Eosinophils Absolute: 0.1 10*3/uL (ref 0.0–0.5)
Eosinophils Relative: 1 %
HCT: 34.8 % — ABNORMAL LOW (ref 36.0–46.0)
Hemoglobin: 11.4 g/dL — ABNORMAL LOW (ref 12.0–15.0)
Immature Granulocytes: 1 %
Lymphocytes Relative: 7 %
Lymphs Abs: 1.4 10*3/uL (ref 0.7–4.0)
MCH: 26.5 pg (ref 26.0–34.0)
MCHC: 32.8 g/dL (ref 30.0–36.0)
MCV: 80.7 fL (ref 80.0–100.0)
Monocytes Absolute: 0.9 10*3/uL (ref 0.1–1.0)
Monocytes Relative: 5 %
Neutro Abs: 17 10*3/uL — ABNORMAL HIGH (ref 1.7–7.7)
Neutrophils Relative %: 86 %
Platelets: 240 10*3/uL (ref 150–400)
RBC: 4.31 MIL/uL (ref 3.87–5.11)
RDW: 14.4 % (ref 11.5–15.5)
WBC: 19.5 10*3/uL — ABNORMAL HIGH (ref 4.0–10.5)
nRBC: 0 % (ref 0.0–0.2)

## 2019-08-11 MED ORDER — OXYTOCIN BOLUS FROM INFUSION
500.0000 mL | Freq: Once | INTRAVENOUS | Status: AC
  Administered 2019-08-11: 500 mL via INTRAVENOUS

## 2019-08-11 MED ORDER — OXYTOCIN 40 UNITS IN NORMAL SALINE INFUSION - SIMPLE MED: 2.5000 [IU]/h | INTRAVENOUS | Status: DC | PRN

## 2019-08-11 MED ORDER — MISOPROSTOL 50MCG HALF TABLET
50.0000 ug | ORAL_TABLET | ORAL | Status: DC
  Administered 2019-08-11: 50 ug via ORAL
  Filled 2019-08-11: qty 1

## 2019-08-11 MED ORDER — TETANUS-DIPHTH-ACELL PERTUSSIS 5-2.5-18.5 LF-MCG/0.5 IM SUSP: 0.5000 mL | Freq: Once | INTRAMUSCULAR | Status: DC

## 2019-08-11 MED ORDER — BUPIVACAINE HCL (PF) 0.25 % IJ SOLN
INTRAMUSCULAR | Status: DC | PRN
  Administered 2019-08-11: 10 mL via EPIDURAL

## 2019-08-11 MED ORDER — DIPHENHYDRAMINE HCL 25 MG PO CAPS: 25.0000 mg | ORAL_CAPSULE | Freq: Four times a day (QID) | ORAL | Status: DC | PRN

## 2019-08-11 MED ORDER — COCONUT OIL OIL: 1.0000 "application " | TOPICAL_OIL | Status: DC | PRN

## 2019-08-11 MED ORDER — FENTANYL CITRATE (PF) 100 MCG/2ML IJ SOLN
INTRAMUSCULAR | Status: DC | PRN
  Administered 2019-08-11: 100 ug via EPIDURAL

## 2019-08-11 MED ORDER — ONDANSETRON HCL 4 MG/2ML IJ SOLN
INTRAMUSCULAR | Status: AC
  Filled 2019-08-11: qty 2

## 2019-08-11 MED ORDER — DIBUCAINE (PERIANAL) 1 % EX OINT: 1.0000 "application " | TOPICAL_OINTMENT | CUTANEOUS | Status: DC | PRN

## 2019-08-11 MED ORDER — METHYLERGONOVINE MALEATE 0.2 MG PO TABS: 0.2000 mg | ORAL_TABLET | ORAL | Status: DC | PRN

## 2019-08-11 MED ORDER — IBUPROFEN 600 MG PO TABS
600.0000 mg | ORAL_TABLET | Freq: Four times a day (QID) | ORAL | Status: DC
  Administered 2019-08-11 – 2019-08-13 (×7): 600 mg via ORAL
  Filled 2019-08-11 (×7): qty 1

## 2019-08-11 MED ORDER — METHYLERGONOVINE MALEATE 0.2 MG/ML IJ SOLN: 0.2000 mg | INTRAMUSCULAR | Status: DC | PRN

## 2019-08-11 MED ORDER — OXYTOCIN 40 UNITS IN NORMAL SALINE INFUSION - SIMPLE MED: 1.0000 m[IU]/min | INTRAVENOUS | Status: DC

## 2019-08-11 MED ORDER — ACETAMINOPHEN 325 MG PO TABS
650.0000 mg | ORAL_TABLET | ORAL | Status: DC | PRN
  Administered 2019-08-13: 650 mg via ORAL
  Filled 2019-08-11: qty 2

## 2019-08-11 MED ORDER — ONDANSETRON HCL 4 MG/2ML IJ SOLN: 4.0000 mg | INTRAMUSCULAR | Status: DC | PRN

## 2019-08-11 MED ORDER — OXYCODONE HCL 5 MG PO TABS
5.0000 mg | ORAL_TABLET | ORAL | Status: DC | PRN
  Administered 2019-08-12 – 2019-08-13 (×2): 5 mg via ORAL
  Filled 2019-08-11 (×2): qty 1

## 2019-08-11 MED ORDER — WITCH HAZEL-GLYCERIN EX PADS: 1.0000 "application " | MEDICATED_PAD | CUTANEOUS | Status: DC | PRN

## 2019-08-11 MED ORDER — FENTANYL CITRATE (PF) 100 MCG/2ML IJ SOLN
INTRAMUSCULAR | Status: AC
  Filled 2019-08-11: qty 2

## 2019-08-11 MED ORDER — OXYCODONE HCL 5 MG PO TABS: 10.0000 mg | ORAL_TABLET | ORAL | Status: DC | PRN

## 2019-08-11 MED ORDER — FENTANYL CITRATE (PF) 100 MCG/2ML IJ SOLN
100.0000 ug | Freq: Once | INTRAMUSCULAR | Status: AC
  Administered 2019-08-11: 100 ug via INTRAVENOUS

## 2019-08-11 MED ORDER — ONDANSETRON HCL 4 MG PO TABS: 4.0000 mg | ORAL_TABLET | ORAL | Status: DC | PRN

## 2019-08-11 MED ORDER — SIMETHICONE 80 MG PO CHEW
80.0000 mg | CHEWABLE_TABLET | ORAL | Status: DC | PRN
  Filled 2019-08-11: qty 1

## 2019-08-11 MED ORDER — OXYTOCIN 40 UNITS IN NORMAL SALINE INFUSION - SIMPLE MED: 2.5000 [IU]/h | INTRAVENOUS | Status: DC

## 2019-08-11 MED ORDER — ONDANSETRON HCL 4 MG/2ML IJ SOLN
4.0000 mg | Freq: Four times a day (QID) | INTRAMUSCULAR | Status: DC | PRN
  Administered 2019-08-11: 4 mg via INTRAVENOUS

## 2019-08-11 MED ORDER — PRENATAL MULTIVITAMIN CH
1.0000 | ORAL_TABLET | Freq: Every day | ORAL | Status: DC
  Administered 2019-08-12 – 2019-08-13 (×2): 1 via ORAL
  Filled 2019-08-11 (×2): qty 1

## 2019-08-11 MED ORDER — BENZOCAINE-MENTHOL 20-0.5 % EX AERO
1.0000 "application " | INHALATION_SPRAY | CUTANEOUS | Status: DC | PRN
  Administered 2019-08-11: 1 via TOPICAL
  Filled 2019-08-11: qty 56

## 2019-08-11 NOTE — Discharge Instructions (Signed)

## 2019-08-11 NOTE — Progress Notes (Signed)
Patient Vitals for the past 4 hrs:  BP Temp Temp src Pulse Resp SpO2  08/11/19 0600 125/70 -- -- 95 16 --  08/11/19 0531 123/70 -- -- 89 17 --  08/11/19 0501 127/70 98.6 F (37 C) Axillary 93 18 --  08/11/19 0441 -- -- -- -- -- 100 %  08/11/19 0431 (!) 142/88 -- -- (!) 114 19 99 %  08/11/19 0401 137/83 -- -- (!) 107 18 99 %  08/11/19 0356 -- -- -- -- -- 97 %  08/11/19 0331 128/77 -- -- 91 18 98 %  08/11/19 0301 126/79 -- -- 98 17 98 %   Pt a little frustrated w/slow progress, but motivated to keep going. cx a little thinner, 4-5/70/-2. FHR Cat 1.  Will restart pitocin, hold am labetalol.

## 2019-08-11 NOTE — Progress Notes (Signed)
Patient ID: Rebecca Gardner, female   DOB: 1995-10-30, 24 y.o.   MRN: [id number]  Undergoing IOL process now >48h; has had cervical foley as well as intermittent dosing of cytotec/Pitocin on alternating basis with the third restart of Pitocin at 0700 this morning; pt and spouse both desire to continue moving forward with the process; comfortable with epidural  BP 139/83, 124/78, 137/83, P80s FHR 125-130, +accels, times of reactivity Ctx difficult to trace; Pit at 64mu/min Cx 4-5/70/-2; AROM for clear fluid and IUPC inserted without difficulty  IUP@37 .3wks cHTN Lengthy IOL process GBS pos  Hopeful that AROM will help her get into active labor; plan to check her cx over the next few hours for progress  [name] CNM 08/11/2019

## 2019-08-11 NOTE — Progress Notes (Signed)
Patient Vitals for the past 4 hrs:  BP Temp Temp src Pulse Resp SpO2  08/11/19 0202 133/87 -- -- 95 18 94 %  08/11/19 0104 123/72 -- -- 94 16 --  08/11/19 0031 119/61 -- -- 99 -- --  08/11/19 0000 (!) 116/55 -- -- 96 17 --  08/10/19 2331 (!) 116/54 -- -- 95 18 --  08/10/19 2312 -- 98.8 F (37.1 C) Axillary -- -- --  08/10/19 2301 130/60 -- -- (!) 107 20 --  08/10/19 2231 140/71 -- -- 98 18 --   Comfortable w/epidural. Ctx q 2-6 minutes. FHR Cat 1.  Pitocin at 34 mu/min.  No change in cx.  Will stop pitocin and give oral cytotec, hopes of thinning cx.

## 2019-08-11 NOTE — Progress Notes (Signed)
LABOR PROGRESS NOTE  Rebecca Gardner is a 24 y.o. G1P0000 at [redacted]w[redacted]d  admitted for IOL due to earlier concern for pre-e with severe features. No longer considered pre-e with severe features as she has had normal range BP and the headache went away with tylenol.  Subjective: Patient starting to feel more pressure  Objective: BP 134/75   Pulse 100   Temp 98.6 F (37 C) (Oral)   Resp 20   Ht 5\' 8"  (1.727 m)   Wt (!) 155.4 kg   LMP 11/22/2018 (Approximate)   SpO2 100%   BMI 52.09 kg/m  or  Vitals:   08/11/19 1148 08/11/19 1151 08/11/19 1156 08/11/19 1201  BP: (!) 132/93 (!) 133/93 (!) 117/53 134/75  Pulse: (!) 107 (!) 112 99 100  Resp:    20  Temp:      TempSrc:      SpO2:      Weight:      Height:        Dilation: 6.5 Effacement (%): 100 Cervical Position: Middle Station: -1 Presentation: Vertex Exam by:: k fields, rn FHT: baseline rate 145, moderate varibility, reactive acel, no decel Toco: q2-3 min  Labs: Lab Results  Component Value Date   WBC 14.3 (H) 08/09/2019   HGB 12.0 08/09/2019   HCT 35.7 (L) 08/09/2019   MCV 79.7 (L) 08/09/2019   PLT 281 08/09/2019    Patient Active Problem List   Diagnosis Date Noted  . Chronic hypertension with superimposed preeclampsia 08/09/2019  . Cellulitis of umbilicus 07/20/2019  . COVID-19 07/13/2019  . Supraventricular tachycardia during pregnancy (HCC) 07/05/2019  . Benign hypertension 05/22/2019  . Supervision of high risk pregnancy, antepartum 02/20/2019  . Right ovarian cyst 02/20/2019  . 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 Depression with anxiety 02/05/2016  . Cephalalgia 02/05/2016  . Obesity 07/30/2015    Assessment / Plan: 24 y.o. G1P0000 at [redacted]w[redacted]d here for IOL after concern for pre-e with severe features.  Labor: Slow progression but now in the active phase. Has received intermittent, alternating cytotec and pitocin. S/p foley bulb. AROM around 0930 with IUPC placement. Regular contractions Fetal Wellbeing:  Cat  1 Pain Control:  Epidural GBS: Positive, PCN Anticipated MOD:  VD Chronic HTN: required two doses of labetolol; the first on admission and one last night, otherwise well controlled off home meds  [redacted]w[redacted]d, MD OB Resident 08/11/2019, 12:28 PM

## 2019-08-11 NOTE — Lactation Note (Addendum)
This note was copied from a baby's chart. Lactation Consultation Note  Patient Name: Rebecca Gardner NIOEV'O Date: 08/11/2019 Reason for consult: Initial assessment;1st time breastfeeding;Early term 37-38.6wks P1, 8 hour ETI infant , less than 6 lbs at birth. One void since birth Mom hx: CHTN on labetalol, obesity, depression with anxiety but no meds.  Per mom, she has medela DEBP at home. Tools given: hand pump mom is short shafted by RN and DEBP by LC due infant not latch at this time:  for breast stimulation, induction and help establish milk supply for ETI infant.  Mom was very emotional during  [facility] consult  when infant was crying, mom started to cry ,  LC ask mom to do STS to soothe infant and explain  that crying is normal behavior for infant,  that their are six infant states. After LC explained this,  mom appeared calmer and  stated she was tired because she was induced on Tuesday,  LC stated she understood and that mom should try and rest.  Mom attempted to latch infant on the right breast using the cross cradle hold, but  infant only held breast in her mouth their was not suckle at this time. LC stated that this is not uncommon within the first 24 hours of an infant's  life, LC ask mom to write down any attempts she make with latching infant at breast ,to  continue to hand express and give infant by EBM by spoon and do STS. Mom hand expressed 8 mls of colostrum that she gave to infant on a spoon.  Per mom, she started leaking at [redacted] weeks gestation in her pregnancy up until delivery. Mom will pump every 3 hours for 15 minutes on initial setting to help establish milk supply and due infant not latch to breast at this time infant is less than 6 lbs and ETI infant. Mom shown how to use DEBP & how to disassemble, clean, & reassemble parts. Mom knows to breastfeed infant according to hunger cues, 8 to 12 times within 24 hours and on demand, not to exceed 3 hours without breastfeeding  infant. LC discussed if infant had to be supplemented, that donor breast milk is available, per parents,  their choice is formula not donor breast milk. Mom wants to keep trying to latch infant and give infant back volume ( EBM ) by hand expression or pumping within the first 24 hours, infant will be re-assess if supplementation is need in morning.  Mom knows to call RN or [facility] for assistance with latch if needed. LC discussed when discharge community resources: [facility] outpatient clinic, LC hot line and LC breastfeeding support group.   Maternal Data Formula Feeding for Exclusion: No Has patient been taught Hand Expression?: Yes Does the patient have breastfeeding experience prior to this delivery?: No  Feeding Feeding Type: Breast Fed  LATCH Score Latch: Too sleepy or reluctant, no latch achieved, no sucking elicited.  Audible Swallowing: None  Type of Nipple: (short shafted)  Comfort (Breast/Nipple): Soft / non-tender  Hold (Positioning): Assistance needed to correctly position infant at breast and maintain latch.     Interventions Interventions: Breast feeding basics reviewed;Breast compression;Assisted with latch;Adjust position;Skin to skin;Support pillows;Breast massage;Position options;Expressed milk;DEBP;Hand pump;Hand express;Pre-pump if needed  Lactation Tools Discussed/Used Tools: Pump WIC Program: No Pump Review: Setup, frequency, and cleaning;Milk Storage Initiated by:: Danelle Earthly, IBCLC Date initiated:: 08/11/19   Consult Status Consult Status: Follow-up Date: 08/12/19 Follow-up type: In-patient    Danelle Earthly  08/11/2019, 9:50 PM

## 2019-08-11 NOTE — Discharge Summary (Signed)
Postpartum Discharge Summary     Patient Name: Rebecca Gardner DOB: 05/22/1996 MRN: [id number]  Date of admission: 08/08/2019 Delivering Provider: Serita Grammes D   Date of discharge: 08/13/2019  Admitting diagnosis: Chronic hypertension in preg Intrauterine pregnancy: [redacted]w[redacted]d    Secondary diagnosis:  Active Problems:   Depression with anxiety   Right ovarian cyst   Benign hypertension   Supraventricular tachycardia during pregnancy ([place]   Positive GBS test   NSVD (normal spontaneous vaginal delivery)  Additional problems: none     Discharge diagnosis: Term Pregnancy Delivered and CHTN                                                                                                Post partum procedures:none  Augmentation: AROM, Pitocin, Cytotec and Foley Balloon  Complications: None  Hospital course:  Induction of Labor With Vaginal Delivery   24y.o. yo G1P0000 at 363w3das admitted to the hospital 08/08/2019 for induction of labor.  Indication for induction: cHTN with initial concern for SIPE due to severe range BPs on arrival with H/A, but her BPs resolved once she got settled in on L&D and pre-e labs were negative. Her H/A also resolved asl well. She had the usual ripening measures with alternate regimens of cytotec/Pit after her foley came out, with no significant change towards labor until her membranes were ruptured on the morning of 08/11/19. At that point she progressed to vaMartellefter a >48hr IOL process. Membrane Rupture Time/Date: 9:28 AM ,08/11/2019   Intrapartum Procedures: Episiotomy: None [1]                                         Lacerations:  None [1]  Patient had delivery of a Viable infant.  Information for the patient's newborn:  HaAleece, Loyd0[[id number]]Delivery Method: Vaginal, Spontaneous(Filed from Delivery Summary)    08/11/2019  Details of delivery can be found in separate delivery note.  Patient had a routine postpartum course with blood pressures  well controlled on oral medications, 120s-130s/80s-90s.  Patient is discharged home 08/13/19. Delivery time: 1:24 PM    Magnesium Sulfate received: No BMZ received: No Rhophylac:N/A MMR:N/A Transfusion:No  Physical exam  Vitals:   08/12/19 0530 08/12/19 1700 08/12/19 2150 08/13/19 0622  BP: 132/84 (!) 133/94 (!) 120/94 126/84  Pulse: 84 90 (!) 110 100  Resp: 18  20 20   Temp: 98 F (36.7 C) 98 F (36.7 C) 98.2 F (36.8 C) 98.6 F (37 C)  TempSrc: Oral  Oral Oral  SpO2:  97%  100%  Weight:      Height:       General: alert, cooperative and no distress Lochia: appropriate Uterine Fundus: firm Incision: N/A DVT Evaluation: No evidence of DVT seen on physical exam. Labs: Lab Results  Component Value Date   WBC 19.5 (H) 08/11/2019   HGB 11.4 (L) 08/11/2019   HCT 34.8 (L) 08/11/2019   MCV 80.7 08/11/2019   PLT 240  08/11/2019   CMP Latest Ref Rng & Units 08/09/2019  Glucose 70 - 99 mg/dL 103(H)  BUN 6 - 20 mg/dL 8  Creatinine 0.44 - 1.00 mg/dL 0.53  Sodium 135 - 145 mmol/L 134(L)  Potassium 3.5 - 5.1 mmol/L 3.8  Chloride 98 - 111 mmol/L 107  CO2 22 - 32 mmol/L 18(L)  Calcium 8.9 - 10.3 mg/dL 9.3  Total Protein 6.5 - 8.1 g/dL 6.3(L)  Total Bilirubin 0.3 - 1.2 mg/dL 0.3  Alkaline Phos 38 - 126 U/L 109  AST 15 - 41 U/L 11(L)  ALT 0 - 44 U/L 13   Edinburgh Score: Edinburgh Postnatal Depression Scale Screening Tool 08/13/2019  I have been able to laugh and see the funny side of things. 0  I have looked forward with enjoyment to things. 0  I have blamed myself unnecessarily when things went wrong. 2  I have been anxious or worried for no good reason. 2  I have felt scared or panicky for no good reason. 2  Things have been getting on top of me. 1  I have been so unhappy that I have had difficulty sleeping. 0  I have felt sad or miserable. 1  I have been so unhappy that I have been crying. 1  The thought of harming myself has occurred to me. 0  Edinburgh Postnatal  Depression Scale Total 9    Discharge instruction: per After Visit Summary and "Baby and Me Booklet".  After visit meds:  Allergies as of 08/13/2019   No Known Allergies     Medication List    STOP taking these medications   aspirin EC 81 MG tablet   labetalol 200 MG tablet Commonly known as: NORMODYNE     TAKE these medications   acetaminophen 325 MG tablet Commonly known as: TYLENOL Take 650 mg by mouth every 6 (six) hours as needed for headache.   amLODipine 5 MG tablet Commonly known as: NORVASC Take 1 tablet (5 mg total) by mouth daily. Start taking on: August 14, 2019   cyclobenzaprine 10 MG tablet Commonly known as: FLEXERIL Take 1 tablet (10 mg total) by mouth every 8 (eight) hours as needed for muscle spasms.   ibuprofen 600 MG tablet Commonly known as: ADVIL Take 1 tablet (600 mg total) by mouth every 6 (six) hours.   omeprazole 20 MG capsule Commonly known as: PRILOSEC Take 1 capsule (20 mg total) by mouth daily. 1 tablet a day   prenatal multivitamin Tabs tablet Take 1 tablet by mouth daily at 12 noon.       Diet: routine diet  Activity: Advance as tolerated. Pelvic rest for 6 weeks.   Outpatient follow up:BP check 1wk; PP visit 4wks Follow up Appt:No future appointments. Follow up Visit: Follow-up Information    [facility].   Why: Blood pressure check in 1 week, Postpartum visit in 4-6 weeks Contact information: [place] [id number] [phone]          Please schedule this patient for Postpartum visit in: 4 weeks with the following provider: Any provider  Virtual  For C/S patients schedule nurse incision check in weeks 2 weeks: no  High risk pregnancy complicated by: cHTN  Delivery mode: SVD  Anticipated Birth Control: Depo  PP Procedures needed: BP check- 1 week (can be virtual with RN)  Schedule Integrated BH visit: no   Newborn Data: Live born female  Birth Weight: 2801gm  (6lb 2.8oz)  APGAR:  65, 9  Newborn Delivery   Birth date/time: 08/11/2019 13:24:00 Delivery type: Vaginal, Spontaneous      Baby Feeding: Bottle Disposition:home with mother   08/13/2019 Fatima Blank, CNM

## 2019-08-12 MED ORDER — MEDROXYPROGESTERONE ACETATE 150 MG/ML IM SUSP
150.0000 mg | Freq: Once | INTRAMUSCULAR | Status: AC
  Administered 2019-08-13: 150 mg via INTRAMUSCULAR
  Filled 2019-08-12: qty 1

## 2019-08-12 MED ORDER — AMLODIPINE BESYLATE 5 MG PO TABS
5.0000 mg | ORAL_TABLET | Freq: Every day | ORAL | Status: DC
  Administered 2019-08-12 – 2019-08-13 (×2): 5 mg via ORAL
  Filled 2019-08-12 (×2): qty 1

## 2019-08-12 NOTE — Anesthesia Postprocedure Evaluation (Signed)
Anesthesia Post Note  Patient: Deadra A Umali  Procedure(s) Performed: AN AD HOC LABOR EPIDURAL     Patient location during evaluation: Mother Baby Anesthesia Type: Epidural Level of consciousness: awake and alert Pain management: pain level controlled Vital Signs Assessment: post-procedure vital signs reviewed and stable Respiratory status: spontaneous breathing, nonlabored ventilation and respiratory function stable Cardiovascular status: stable Postop Assessment: no headache, no backache and epidural receding Anesthetic complications: no Comments: Per telephone conversation    Last Vitals:  Vitals:   08/12/19 0133 08/12/19 0530  BP: 138/89 132/84  Pulse: 99 84  Resp: 18 18  Temp: 36.8 C 36.7 C  SpO2: 99%     Last Pain:  Vitals:   08/12/19 0600  TempSrc:   PainSc: 6    Pain Goal:                   Ayeisha Lindenberger N

## 2019-08-12 NOTE — Progress Notes (Signed)
Post Partum Day #1 Subjective: up ad lib, tolerating PO and fatigued, c/o lower-mid back sciatic-like pain- just recently got medicated for it; had tried breastfeeding but wants to switch to formula; still wants DMPA for contraception BPs overnight 138/89, 140/98  Objective: Blood pressure 132/84, pulse 84, temperature 98 F (36.7 C), temperature source Oral, resp. rate 18, height 5\' 8"  (1.727 m), weight (!) 155.4 kg, last menstrual period 11/22/2018, SpO2 99 %, unknown if currently breastfeeding.  Physical Exam:  General: alert, cooperative and mild distress Lochia: appropriate Uterine Fundus: firm DVT Evaluation: No evidence of DVT seen on physical exam.  Recent Labs    08/11/19 1505  HGB 11.4*  HCT 34.8*    Assessment/Plan: Plan for discharge tomorrow  Start Norvasc 5mg  for better BP control To let her RN know if pain meds don't help her back DMPA ordered   LOS: 3 days   10/11/19 CNM 08/12/2019, 7:44 AM

## 2019-08-12 NOTE — Progress Notes (Signed)
CSW received consult for hx of Anxiety and Depression.  CSW met with MOB to offer support and complete assessment.    CSW met with MOB at bedside to discuss mental health history. FOB was present at time of arrival, however, stepped out of room to offer privacy. Paulino Rily, was present and remained in bassinet. MOB was pleasant and engaged throughout assessment.    MOB denied any SI, HI, or domestic violence. MOB reported no history of anxiety or depression in over three and a half years, however, MOB reported some anxiety and tears yesterday related to breastfeeding and being a new mother. CSW reassured MOB and normalized feelings.  MOB reported not needing any medication or therapy since 2017. MOB identified FOB, and her parents  as support system. MOB added parents live next door and are looking forward to infant coming home. MOB also stated her mom is retired and looking forward to assisting with infants care.   CSW provided education regarding the baby blues period vs. perinatal mood disorders, discussed treatment and gave resources for mental health follow up if concerns arise.  CSW recommends self-evaluation during the postpartum time period using the New Mom Checklist from Postpartum Progress and encouraged MOB to contact a medical professional if symptoms are noted at any time.    MOB confirmed having all essential items for infant once discharged and stated infant would be sleeping in a bassinet once home. CSW provided review of Sudden Infant Death Syndrome (SIDS) precautions and safe sleeping habits.       CSW identifies no further need for intervention and no barriers to discharge at this time.  Peighton Edgin D. Lissa Morales, MSW, [facility] Clinical Social Worker [phone]

## 2019-08-12 NOTE — Lactation Note (Signed)
This note was copied from a baby's chart. Lactation Consultation Note  Patient Name: Rebecca Gardner CKFWB'L Date: 08/12/2019 Reason for consult: Follow-up assessment;Primapara;Early term 37-38.6wks  1804-1809 F/U visit with P1 mom of ETI who is now 61 hours old with 3% wt loss  LC entered room to find parents changing wet diaper. Mom states she has decided to strictly formula feed now, states baby "won't take my nipple." Mom reports infant's fussiness is better today after exclusively formula feeding.   Encouraged mom to call out if she changes her mind about breastfeeding. Reviewed prevention and treatment of engorgement with ice, decreased stimulation, and wearing a bra 24/7 for two weeks. Reviewed lactation phone numbers for any questions/concerns.  Interventions Interventions: Ice  Consult Status Consult Status: Complete    Virgia Land 08/12/2019, 6:11 PM

## 2019-08-13 MED ORDER — IBUPROFEN 600 MG PO TABS: 600.0000 mg | ORAL_TABLET | Freq: Four times a day (QID) | ORAL | 0 refills | Status: DC

## 2019-08-13 MED ORDER — AMLODIPINE BESYLATE 5 MG PO TABS: 5.0000 mg | ORAL_TABLET | Freq: Every day | ORAL | 2 refills | Status: DC

## 2019-08-13 NOTE — Progress Notes (Signed)
Pt upset, crying in bed. States she is upset that baby is still losing weight. Reviewed normal newborn weight patterns. Pt says she is worried she is "failing her" and that baby has been fussy during the night. Reassured pt she is doing everything right with baby and reviewed normal newborn behavior again, as this RN had reviewed normal newborn behavior earlier in the shift as well. Offered to take baby to nursery so pt can rest and she declined. Pt had completed Edinburgh scale with a total score of 9. Social work consult entered. Encouraged pt to rest while baby is resting in crib.

## 2019-08-13 NOTE — Progress Notes (Signed)
CSW received and acknowledges consult for EDPS of 9.  Consult screened out due to 9 on EDPS does not warrant a CSW consult.  MOB whom scores are greater than 9/yes to question 10 on Edinburgh Postpartum Depression Screen warrants a CSW consult.   Elasia Furnish D. Tenille Morrill, MSW, LCSWA Clinical Social Worker [phone] 

## 2019-08-14 ENCOUNTER — Other Ambulatory Visit: Payer: 59

## 2019-08-15 DIAGNOSIS — Z029 Encounter for administrative examinations, unspecified: Secondary | ICD-10-CM

## 2019-08-17 ENCOUNTER — Encounter: Payer: 59 | Admitting: Advanced Practice Midwife

## 2019-08-17 ENCOUNTER — Other Ambulatory Visit: Payer: 59

## 2019-08-18 ENCOUNTER — Telehealth: Payer: Self-pay

## 2019-08-18 ENCOUNTER — Telehealth (INDEPENDENT_AMBULATORY_CARE_PROVIDER_SITE_OTHER): Payer: 59 | Admitting: *Deleted

## 2019-08-18 ENCOUNTER — Encounter: Payer: Self-pay | Admitting: *Deleted

## 2019-08-18 VITALS — BP 139/91 | HR 87

## 2019-08-18 DIAGNOSIS — Z0130 Encounter for examination of blood pressure without abnormal findings: Secondary | ICD-10-CM

## 2019-08-18 NOTE — Telephone Encounter (Signed)
received fax from [employer] that pt requested cancellation of monitoring services

## 2019-08-18 NOTE — Progress Notes (Signed)
   NURSE VISIT- BLOOD PRESSURE CHECK  SUBJECTIVE:  Rebecca Gardner is a 24 y.o. G58P1001 female here for BP check. She is postpartum, delivery date 08/11/2019    HYPERTENSION ROS:  Pregnant/postpartum:  . Severe headaches that don't go away with tylenol/other medicines: No  . Visual changes (seeing spots/double/blurred vision) No  . Severe pain under right breast breast or in center of upper chest No  . Severe nausea/vomiting No  . Taking medicines as instructed yes  Pt reports Rebecca Gardner all over her body starting 3 days ago. Only new things she has been given is Norvasc.   OBJECTIVE:  BP (!) 139/91 (BP Location: Left Arm, Patient Position: Sitting, Cuff Size: Normal)   Pulse 87   Breastfeeding No   Appearance alert, well appearing, and in no distress.  ASSESSMENT: Postpartum  blood pressure check  PLAN: Note routed to Dr. Emelda Fear  Recommendations: no changes needed   Follow-up: as scheduled   Rebecca Gardner, Faith Rogue  08/18/2019 10:52 AM

## 2019-08-22 ENCOUNTER — Other Ambulatory Visit: Payer: 59

## 2019-08-25 ENCOUNTER — Other Ambulatory Visit: Payer: 59

## 2019-08-25 ENCOUNTER — Encounter: Payer: 59 | Admitting: Women's Health

## 2019-08-29 DIAGNOSIS — M5489 Other dorsalgia: Secondary | ICD-10-CM | POA: Diagnosis not present

## 2019-09-05 ENCOUNTER — Telehealth: Payer: Self-pay

## 2019-09-05 NOTE — Telephone Encounter (Signed)
-----   Message from Vallarie Mare sent at 09/01/2019  4:46 PM EDT ----- Pt left message stating she's being charged $2500 for a monitor that was ordered by another provider   [phone]

## 2019-09-05 NOTE — Telephone Encounter (Signed)
Per Fatima Blank ,[employer] [facility] Rep, patient's [facility] insurance denied the monitor and [employer] is filing an filing an appeal. Patient will not receive bill. He has asked that I forward her records to help with appeal. Left message for patient to call back.

## 2019-09-18 ENCOUNTER — Encounter: Payer: Self-pay | Admitting: Women's Health

## 2019-09-18 ENCOUNTER — Telehealth (INDEPENDENT_AMBULATORY_CARE_PROVIDER_SITE_OTHER): Payer: 59 | Admitting: Women's Health

## 2019-09-18 DIAGNOSIS — K5900 Constipation, unspecified: Secondary | ICD-10-CM

## 2019-09-18 NOTE — Progress Notes (Signed)
TELEHEALTH VIRTUAL POSTPARTUM VISIT ENCOUNTER NOTE Patient name: Rebecca Gardner MRN [id number]  Date of birth: 12-07-1995  I connected with patient on 09/18/19 at 11:10 AM EDT by MyChart video  and verified that I am speaking with the correct person using two identifiers. Due to COVID-19 recommendations, pt is not currently in our office.    I discussed the limitations, risks, security and privacy concerns of performing an evaluation and management service by telephone and the availability of in person appointments. I also discussed with the patient that there may be a patient responsible charge related to this service. The patient expressed understanding and agreed to proceed.  Chief Complaint:   postpartum visit  History of Present Illness:   Rebecca Gardner is a 24 y.o. G1P1001 Caucasian female being evaluated today for a postpartum visit. She is 5 weeks postpartum following a spontaneous vaginal delivery at 37.3 gestational weeks after IOL for [facility], initially thought to have SIPE.  Anesthesia: epidural. Laceration: none. I have fully reviewed the prenatal and intrapartum course. D/C'd on norvasc 5mg  daily. Pregnancy complicated by [facility]. Postpartum course has been uncomplicated. Bleeding on period now. Bowel function is abnormal: constipation, q 2-3d, hard, strains, taking colace bid and miralax. Bladder function is normal.  Patient is sexually active. Last sexual activity: on Friday.  Contraception method is got depo on 08/13/19 prior to d/c from hospital.  Last pap Jan 2018 in [place].  Results were normal .  Patient's last menstrual period was 09/17/2019.  Baby's course has been uncomplicated. Baby is feeding by bottle   Edinburgh Postpartum Depression Screening: negative. Does have anxiety, has h/o same. 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 No depression.  Edinburgh Postnatal Depression Scale - 09/18/19 1129      Edinburgh Postnatal Depression Scale:  In the Past 7 Days   I  have been able to laugh and see the funny side of things.  0    I have looked forward with enjoyment to things.  0    I have blamed myself unnecessarily when things went wrong.  1    I have been anxious or worried for no good reason.  2    I have felt scared or panicky for no good reason.  2    Things have been getting on top of me.  1    I have been so unhappy that I have had difficulty sleeping.  0    I have felt sad or miserable.  1    I have been so unhappy that I have been crying.  1    The thought of harming myself has occurred to me.  0    Edinburgh Postnatal Depression Scale Total  8      Review of Systems:   Pertinent items are noted in HPI Denies Abnormal vaginal discharge w/ itching/odor/irritation, headaches, visual changes, shortness of breath, chest pain, abdominal pain, severe nausea/vomiting, or problems with urination or bowel movements. Pertinent History Reviewed:  Reviewed past medical,surgical, obstetrical and family history.  Reviewed problem list, medications and allergies. OB History  Gravida Para Term Preterm AB Living  1 1 1  0 0 1  SAB TAB Ectopic Multiple Live Births  0 0 0 0 1    # Outcome Date GA Lbr Len/2nd Weight Sex Delivery Anes PTL Lv  1 Term 08/11/19 [redacted]w[redacted]d 05:56 / 00:28 6 lb 2.8 oz (2.801 kg) F Vag-Spont EPI  LIV   Physical Assessment:  Vitals:   09/18/19 1125  BP: 127/82  Pulse: 97  Weight: (!) 312 lb (141.5 kg)  Height: 5\' 8"  (1.727 m)  Body mass index is 47.44 kg/m.       Physical Examination:  General:  Alert, oriented and cooperative.   Mental Status: Normal mood and affect perceived. Normal judgment and thought content.  Rest of physical exam deferred due to type of encounter       No results found for this or any previous visit (from the past 24 hour(s)).  Assessment & Plan:  1) Postpartum exam 2) 5 wks s/p SVB after IOL for CHTN 3) Bottlefeeding 4) Depression screening 5) Contraception> s/p depo on 08/13/19 6) Due for pap>  schedule today 7) Constipation> gave printed prevention/relief measures   Meds: No orders of the defined types were placed in this encounter.   I discussed the assessment and treatment plan with the patient. The patient was provided an opportunity to ask questions and all were answered. The patient agreed with the plan and demonstrated an understanding of the instructions.   The patient was advised to call back or seek an in-person evaluation/go to the ED for any concerning postpartum symptoms.  I provided 15 minutes of non-face-to-face time during this encounter.  Follow-up: Return for 1st availabe pap & physical, needs next depo (had on 08/13/19).   No orders of the defined types were placed in this encounter.   [place], [facility] 09/18/2019 11:50 AM

## 2019-09-18 NOTE — Patient Instructions (Addendum)
Constipation  Drink plenty of fluid, preferably water, throughout the day  Eat foods high in fiber such as fruits, vegetables, and grains  Exercise, such as walking, is a good way to keep your bowels regular  Drink warm fluids, especially warm prune juice, or decaf coffee  Eat a 1/2 cup of real oatmeal (not instant), 1/2 cup applesauce, and 1/2-1 cup warm prune juice every day  If needed, you may take Colace (docusate sodium) stool softener once or twice a day to help keep the stool soft. If you are pregnant, wait until you are out of your first trimester (12-14 weeks of pregnancy)  If you still are having problems with constipation, you may take Miralax once daily (up to 4 times daily) as needed to help keep your bowels regular.  If you are pregnant, wait until you are out of your first trimester (12-14 weeks of pregnancy)

## 2019-10-04 ENCOUNTER — Other Ambulatory Visit: Payer: 59 | Admitting: Women's Health

## 2019-10-06 ENCOUNTER — Ambulatory Visit: Payer: 59 | Attending: Internal Medicine

## 2019-10-06 DIAGNOSIS — Z23 Encounter for immunization: Secondary | ICD-10-CM

## 2019-10-06 NOTE — Progress Notes (Signed)
   Covid-19 Vaccination Clinic  Name:  MYSTY KIELTY    MRN: [id number] DOB: 10/16/1995  10/06/2019  Ms. Cabacungan was observed post Covid-19 immunization for 15 minutes without incident. She was provided with Vaccine Information Sheet and instruction to access the V-Safe system.   Ms. Sarwar was instructed to call 911 with any severe reactions post vaccine: Marland Kitchen Difficulty breathing  . Swelling of face and throat  . A fast heartbeat  . A bad rash all over body  . Dizziness and weakness   Immunizations Administered    Name Date Dose VIS Date Route   Moderna COVID-19 Vaccine 10/06/2019 10:45 AM 0.5 mL 05/2019 Intramuscular   Manufacturer: Moderna   Lot: [id number]   NDC: 96283-662-94

## 2019-10-10 ENCOUNTER — Other Ambulatory Visit: Payer: 59 | Admitting: Women's Health

## 2019-10-30 ENCOUNTER — Ambulatory Visit: Payer: 59

## 2019-11-09 ENCOUNTER — Ambulatory Visit: Payer: 59 | Attending: Internal Medicine

## 2019-11-09 DIAGNOSIS — Z23 Encounter for immunization: Secondary | ICD-10-CM

## 2019-11-09 NOTE — Progress Notes (Signed)
   Covid-19 Vaccination Clinic  Name:  Rebecca Gardner    MRN: [id number] DOB: 1995/12/21  11/09/2019  Rebecca Gardner was observed post Covid-19 immunization for 15 minutes without incident. She was provided with Vaccine Information Sheet and instruction to access the V-Safe system.   Rebecca Gardner was instructed to call 911 with any severe reactions post vaccine: Marland Kitchen Difficulty breathing  . Swelling of face and throat  . A fast heartbeat  . A bad rash all over body  . Dizziness and weakness   Immunizations Administered    Name Date Dose VIS Date Route   Moderna COVID-19 Vaccine 11/09/2019 10:28 AM 0.5 mL 05/2019 Intramuscular   Manufacturer: Moderna   Lot: [id number]   NDC: 62824-175-30

## 2019-11-15 ENCOUNTER — Other Ambulatory Visit: Payer: 59 | Admitting: Women's Health

## 2019-11-15 NOTE — Telephone Encounter (Signed)
Patient is very upset please advise scanned in lab from hospital. Covering pcp

## 2019-11-21 NOTE — Progress Notes (Signed)
Rebecca Gardner is at [redacted]w[redacted]d Estimated Date of Delivery: 08/29/19  NST being performed due to hypertension on labetalol 200 BID  Today the NST is Reactive  Fetal Monitoring:  Baseline: 140 bpm, Variability: Good {> 6 bpm), Accelerations: Reactive and Decelerations: Absent   reactive  The accelerations are >15 bpm and more than 2 in 20 minutes  Final diagnosis:  Reactive NST  Lazaro Arms, MD

## 2020-06-18 DIAGNOSIS — J029 Acute pharyngitis, unspecified: Secondary | ICD-10-CM | POA: Diagnosis not present

## 2020-06-18 DIAGNOSIS — R0981 Nasal congestion: Secondary | ICD-10-CM | POA: Diagnosis not present

## 2020-06-18 DIAGNOSIS — R197 Diarrhea, unspecified: Secondary | ICD-10-CM | POA: Diagnosis not present

## 2020-06-18 DIAGNOSIS — R519 Headache, unspecified: Secondary | ICD-10-CM | POA: Diagnosis not present

## 2020-06-18 DIAGNOSIS — R52 Pain, unspecified: Secondary | ICD-10-CM | POA: Diagnosis not present

## 2020-06-18 DIAGNOSIS — R6883 Chills (without fever): Secondary | ICD-10-CM | POA: Diagnosis not present

## 2020-06-18 DIAGNOSIS — R509 Fever, unspecified: Secondary | ICD-10-CM | POA: Diagnosis not present

## 2020-06-18 DIAGNOSIS — R059 Cough, unspecified: Secondary | ICD-10-CM | POA: Diagnosis not present

## 2020-07-18 ENCOUNTER — Ambulatory Visit: Payer: 59 | Admitting: Family

## 2020-07-18 ENCOUNTER — Encounter: Payer: Self-pay | Admitting: Family

## 2020-07-18 ENCOUNTER — Other Ambulatory Visit: Payer: Self-pay

## 2020-07-18 VITALS — BP 139/88 | HR 99 | Temp 98.0°F | Ht 68.0 in | Wt 349.2 lb

## 2020-07-18 DIAGNOSIS — Z30011 Encounter for initial prescription of contraceptive pills: Secondary | ICD-10-CM | POA: Diagnosis not present

## 2020-07-18 DIAGNOSIS — F320 Major depressive disorder, single episode, mild: Secondary | ICD-10-CM | POA: Diagnosis not present

## 2020-07-18 DIAGNOSIS — I10 Essential (primary) hypertension: Secondary | ICD-10-CM | POA: Diagnosis not present

## 2020-07-18 DIAGNOSIS — F411 Generalized anxiety disorder: Secondary | ICD-10-CM | POA: Diagnosis not present

## 2020-07-18 LAB — PREGNANCY, URINE: Preg Test, Ur: NEGATIVE

## 2020-07-18 MED ORDER — BUSPIRONE HCL 5 MG PO TABS: 5.0000 mg | ORAL_TABLET | Freq: Three times a day (TID) | ORAL | 1 refills | Status: DC | PRN

## 2020-07-18 MED ORDER — NORGESTIMATE-ETH ESTRADIOL 0.25-35 MG-MCG PO TABS: 1.0000 | ORAL_TABLET | Freq: Every day | ORAL | 4 refills | Status: DC

## 2020-07-18 NOTE — Progress Notes (Signed)
Subjective:    Patient ID: Rebecca Gardner, female    DOB: Mar 11, 1996, 25 y.o.   MRN: [id number]  Chief Complaint  Patient presents with  . Contraception    Discuss OCP   Pt presents to the office today to discuss OC. She has one daughter that was March 2021. She is currently sexually active. Her last menstrual cycle was two week ago.   She reports she has had an IUD in the past and was embedded. She reports after her depo-provera she broke out in rash.  Anxiety Presents for follow-up visit. Symptoms include excessive worry, irritability and nervous/anxious behavior. Symptoms occur occasionally. The severity of symptoms is moderate.    Depression        This is a chronic problem.  The onset quality is gradual.   Associated symptoms include irritable and decreased interest.  Associated symptoms include no hopelessness.  Past treatments include nothing.  Past medical history includes anxiety.       Review of Systems  Constitutional: Positive for irritability.  Psychiatric/Behavioral: Positive for depression. The patient is nervous/anxious.   All other systems reviewed and are negative.      Objective:   Physical Exam Vitals reviewed.  Constitutional:      General: She is irritable. She is not in acute distress.    Appearance: She is well-developed and well-nourished. She is obese.  HENT:     Head: Normocephalic and atraumatic.     Right Ear: Tympanic membrane normal.     Left Ear: Tympanic membrane normal.     Mouth/Throat:     Mouth: Oropharynx is clear and moist.  Eyes:     Pupils: Pupils are equal, round, and reactive to light.  Neck:     Thyroid: No thyromegaly.  Cardiovascular:     Rate and Rhythm: Normal rate and regular rhythm.     Pulses: Intact distal pulses.     Heart sounds: Normal heart sounds. No murmur heard.   Pulmonary:     Effort: Pulmonary effort is normal. No respiratory distress.     Breath sounds: Normal breath sounds. No wheezing.   Abdominal:     General: Bowel sounds are normal. There is no distension.     Palpations: Abdomen is soft.     Tenderness: There is no abdominal tenderness.  Musculoskeletal:        General: No tenderness or edema. Normal range of motion.     Cervical back: Normal range of motion and neck supple.  Skin:    General: Skin is warm and dry.  Neurological:     Mental Status: She is alert and oriented to person, place, and time.     Cranial Nerves: No cranial nerve deficit.     Deep Tendon Reflexes: Reflexes are normal and symmetric.  Psychiatric:        Mood and Affect: Mood and affect normal.        Behavior: Behavior normal.        Thought Content: Thought content normal.        Judgment: Judgment normal.       BP 140/88   Pulse 99   Temp 98 F (36.7 C) (Temporal)   Ht 5\' 8"  (1.727 m)   Wt (!) 349 lb 3.2 oz (158.4 kg)   BMI 53.10 kg/m      Assessment & Plan:  Ventrella comes in today with chief complaint of Contraception (Discuss OCP)   Diagnosis and orders addressed:  1. Benign hypertension  2. Morbid obesity (HCC)  3. Encounter for initial prescription of contraceptive pills Take every day, set an alarm on phone Discussed possible adverse effects Encouraged weight loss - Pregnancy, urine - norgestimate-ethinyl estradiol (SPRINTEC 28) 0.25-35 MG-MCG tablet; Take 1 tablet by mouth daily.  Dispense: 90 tablet; Refill: 4  4. GAD (generalized anxiety disorder) Will start Buspar 5 mg TID Prn  Stress management discussed  - busPIRone (BUSPAR) 5 MG tablet; Take 1 tablet (5 mg total) by mouth 3 (three) times daily as needed.  Dispense: 90 tablet; Refill: 1  5. Depression, major, single episode, mild (HCC) - busPIRone (BUSPAR) 5 MG tablet; Take 1 tablet (5 mg total) by mouth 3 (three) times daily as needed.  Dispense: 90 tablet; Refill: 1   Labs pending Health Maintenance reviewed Diet and exercise encouraged  Follow up plan: 4 weeks to recheck  GAD   Jannifer Rodney, FNP

## 2020-07-18 NOTE — Patient Instructions (Signed)
[email or web address]">  Generalized Anxiety Disorder, Adult Generalized anxiety disorder (GAD) is a mental health condition. Unlike normal worries, anxiety related to GAD is not triggered by a specific event. These worries do not fade or get better with time. GAD interferes with relationships, work, and school. GAD symptoms can vary from mild to severe. People with severe GAD can have intense waves of anxiety with physical symptoms that are similar to panic attacks. What are the causes? The exact cause of GAD is not known, but the following are believed to have an impact:  Differences in natural brain chemicals.  Genes passed down from parents to children.  Differences in the way threats are perceived.  Development during childhood.  Personality. What increases the risk? The following factors may make you more likely to develop this condition:  Being female.  Having a family history of anxiety disorders.  Being very shy.  Experiencing very stressful life events, such as the death of a loved one.  Having a very stressful family environment. What are the signs or symptoms? People with GAD often worry excessively about many things in their lives, such as their health and family. Symptoms may also include:  Mental and emotional symptoms: ? Worrying excessively about natural disasters. ? Fear of being late. ? Difficulty concentrating. ? Fears that others are judging your performance.  Physical symptoms: ? Fatigue. ? Headaches, muscle tension, muscle twitches, trembling, or feeling shaky. ? Feeling like your heart is pounding or beating very fast. ? Feeling out of breath or like you cannot take a deep breath. ? Having trouble falling asleep or staying asleep, or experiencing restlessness. ? Sweating. ? Nausea, diarrhea, or irritable bowel syndrome (IBS).  Behavioral symptoms: ? Experiencing erratic moods or irritability. ? Avoidance of new situations. ? Avoidance of  people. ? Extreme difficulty making decisions. How is this diagnosed? This condition is diagnosed based on your symptoms and medical history. You will also have a physical exam. Your health care provider may perform tests to rule out other possible causes of your symptoms. To be diagnosed with GAD, a person must have anxiety that:  Is out of his or her control.  Affects several different aspects of his or her life, such as work and relationships.  Causes distress that makes him or her unable to take part in normal activities.  Includes at least three symptoms of GAD, such as restlessness, fatigue, trouble concentrating, irritability, muscle tension, or sleep problems. Before your health care provider can confirm a diagnosis of GAD, these symptoms must be present more days than they are not, and they must last for 6 months or longer. How is this treated? This condition may be treated with:  Medicine. Antidepressant medicine is usually prescribed for long-term daily control. Anti-anxiety medicines may be added in severe cases, especially when panic attacks occur.  Talk therapy (psychotherapy). Certain types of talk therapy can be helpful in treating GAD by providing support, education, and guidance. Options include: ? Cognitive behavioral therapy (CBT). People learn coping skills and self-calming techniques to ease their physical symptoms. They learn to identify unrealistic thoughts and behaviors and to replace them with more appropriate thoughts and behaviors. ? Acceptance and commitment therapy (ACT). This treatment teaches people how to be mindful as a way to cope with unwanted thoughts and feelings. ? Biofeedback. This process trains you to manage your body's response (physiological response) through breathing techniques and relaxation methods. You will work with a therapist while machines are used to monitor your physical   symptoms.  Stress management techniques. These include yoga,  meditation, and exercise. A mental health specialist can help determine which treatment is best for you. Some people see improvement with one type of therapy. However, other people require a combination of therapies.   Follow these instructions at home: Lifestyle  Maintain a consistent routine and schedule.  Anticipate stressful situations. Create a plan, and allow extra time to work with your plan.  Practice stress management or self-calming techniques that you have learned from your therapist or your health care provider. General instructions  Take over-the-counter and prescription medicines only as told by your health care provider.  Understand that you are likely to have setbacks. Accept this and be kind to yourself as you persist to take better care of yourself.  Recognize and accept your accomplishments, even if you judge them as small.  Keep all follow-up visits as told by your health care provider. This is important. Contact a health care provider if:  Your symptoms do not get better.  Your symptoms get worse.  You have signs of depression, such as: ? A persistently sad or irritable mood. ? Loss of enjoyment in activities that used to bring you joy. ? Change in weight or eating. ? Changes in sleeping habits. ? Avoiding friends or family members. ? Loss of energy for normal tasks. ? Feelings of guilt or worthlessness. Get help right away if:  You have serious thoughts about hurting yourself or others. If you ever feel like you may hurt yourself or others, or have thoughts about taking your own life, get help right away. Go to your nearest emergency department or:  Call your local emergency services (911 in the [place]  Call a suicide crisis helpline, such as the National Suicide Prevention Lifeline at 1-800-273-8255. This is open 24 hours a day in the [place]  Text the Crisis Text Line at 741741 (in the [place] Summary  Generalized anxiety disorder (GAD) is a mental  health condition that involves worry that is not triggered by a specific event.  People with GAD often worry excessively about many things in their lives, such as their health and family.  GAD may cause symptoms such as restlessness, trouble concentrating, sleep problems, frequent sweating, nausea, diarrhea, headaches, and trembling or muscle twitching.  A mental health specialist can help determine which treatment is best for you. Some people see improvement with one type of therapy. However, other people require a combination of therapies. This information is not intended to replace advice given to you by your health care provider. Make sure you discuss any questions you have with your health care provider. Document Revised: 03/15/2019 Document Reviewed: 03/15/2019 Elsevier Patient Education  2021 [employer] Inc.  

## 2020-08-11 ENCOUNTER — Other Ambulatory Visit: Payer: Self-pay | Admitting: Family

## 2020-08-11 DIAGNOSIS — F411 Generalized anxiety disorder: Secondary | ICD-10-CM

## 2020-08-11 DIAGNOSIS — F320 Major depressive disorder, single episode, mild: Secondary | ICD-10-CM

## 2020-08-15 ENCOUNTER — Encounter: Payer: Self-pay | Admitting: Family

## 2020-08-15 ENCOUNTER — Other Ambulatory Visit: Payer: Self-pay

## 2020-08-15 ENCOUNTER — Ambulatory Visit: Payer: 59 | Admitting: Family

## 2020-08-15 VITALS — BP 159/98 | HR 102 | Temp 98.2°F | Ht 68.0 in | Wt 354.8 lb

## 2020-08-15 DIAGNOSIS — F320 Major depressive disorder, single episode, mild: Secondary | ICD-10-CM | POA: Diagnosis not present

## 2020-08-15 DIAGNOSIS — F411 Generalized anxiety disorder: Secondary | ICD-10-CM

## 2020-08-15 MED ORDER — ESCITALOPRAM OXALATE 10 MG PO TABS: 10.0000 mg | ORAL_TABLET | Freq: Every day | ORAL | 3 refills | Status: DC

## 2020-08-15 NOTE — Progress Notes (Signed)
   Subjective:    Patient ID: Rebecca Gardner, female    DOB: [birth year], 25 y.o.   MRN: [id number]  Chief Complaint  Patient presents with  . Anxiety    4 week rck. Buspar gave panic attack stopped taking    PT presents to the office today to recheck GAD. We started her on Buspar, but made her paranoid. She stopped it.   Anxiety Presents for follow-up visit. Symptoms include depressed mood, excessive worry, irritability, nervous/anxious behavior and restlessness. Symptoms occur most days. The severity of symptoms is moderate.        Review of Systems  Constitutional: Positive for irritability.  Psychiatric/Behavioral: The patient is nervous/anxious.   All other systems reviewed and are negative.      Objective:   Physical Exam Vitals reviewed.  Constitutional:      General: She is not in acute distress.    Appearance: She is well-developed. She is obese.  HENT:     Head: Normocephalic and atraumatic.     Right Ear: Tympanic membrane normal.     Left Ear: Tympanic membrane normal.  Eyes:     Pupils: Pupils are equal, round, and reactive to light.  Neck:     Thyroid: No thyromegaly.  Cardiovascular:     Rate and Rhythm: Normal rate and regular rhythm.     Heart sounds: Normal heart sounds. No murmur heard.   Pulmonary:     Effort: Pulmonary effort is normal. No respiratory distress.     Breath sounds: Normal breath sounds. No wheezing.  Abdominal:     General: Bowel sounds are normal. There is no distension.     Palpations: Abdomen is soft.     Tenderness: There is no abdominal tenderness.  Musculoskeletal:        General: No tenderness. Normal range of motion.     Cervical back: Normal range of motion and neck supple.  Skin:    General: Skin is warm and dry.  Neurological:     Mental Status: She is alert and oriented to person, place, and time.     Cranial Nerves: No cranial nerve deficit.     Deep Tendon Reflexes: Reflexes are normal and symmetric.   Psychiatric:        Behavior: Behavior normal.        Thought Content: Thought content normal.        Judgment: Judgment normal.          BP (!) 159/98   Pulse (!) 102   Temp 98.2 F (36.8 C) (Temporal)   Ht 5\' 8"  (1.727 m)   Wt (!) 354 lb 12.8 oz (160.9 kg)   LMP 08/15/2020   BMI 53.95 kg/m   Assessment & Plan:  [name] comes in today with chief complaint of Anxiety (4 week rck. Buspar gave panic attack stopped taking )   Diagnosis and orders addressed:  1. GAD (generalized anxiety disorder) - escitalopram (LEXAPRO) 10 MG tablet; Take 1 tablet (10 mg total) by mouth daily.  Dispense: 90 tablet; Refill: 3  2. Depression, major, single episode, mild (HCC) - escitalopram (LEXAPRO) 10 MG tablet; Take 1 tablet (10 mg total) by mouth daily.  Dispense: 90 tablet; Refill: 3  3. Morbid obesity (HCC)    Will start Lexapro 10 mg today Stress management discussed RTO in 1 month   Dorathy Daft, FNP

## 2020-08-15 NOTE — Patient Instructions (Signed)
[email or web address]">  Generalized Anxiety Disorder, Adult Generalized anxiety disorder (GAD) is a mental health condition. Unlike normal worries, anxiety related to GAD is not triggered by a specific event. These worries do not fade or get better with time. GAD interferes with relationships, work, and school. GAD symptoms can vary from mild to severe. People with severe GAD can have intense waves of anxiety with physical symptoms that are similar to panic attacks. What are the causes? The exact cause of GAD is not known, but the following are believed to have an impact:  Differences in natural brain chemicals.  Genes passed down from parents to children.  Differences in the way threats are perceived.  Development during childhood.  Personality. What increases the risk? The following factors may make you more likely to develop this condition:  Being female.  Having a family history of anxiety disorders.  Being very shy.  Experiencing very stressful life events, such as the death of a loved one.  Having a very stressful family environment. What are the signs or symptoms? People with GAD often worry excessively about many things in their lives, such as their health and family. Symptoms may also include:  Mental and emotional symptoms: ? Worrying excessively about natural disasters. ? Fear of being late. ? Difficulty concentrating. ? Fears that others are judging your performance.  Physical symptoms: ? Fatigue. ? Headaches, muscle tension, muscle twitches, trembling, or feeling shaky. ? Feeling like your heart is pounding or beating very fast. ? Feeling out of breath or like you cannot take a deep breath. ? Having trouble falling asleep or staying asleep, or experiencing restlessness. ? Sweating. ? Nausea, diarrhea, or irritable bowel syndrome (IBS).  Behavioral symptoms: ? Experiencing erratic moods or irritability. ? Avoidance of new situations. ? Avoidance of  people. ? Extreme difficulty making decisions. How is this diagnosed? This condition is diagnosed based on your symptoms and medical history. You will also have a physical exam. Your health care provider may perform tests to rule out other possible causes of your symptoms. To be diagnosed with GAD, a person must have anxiety that:  Is out of his or her control.  Affects several different aspects of his or her life, such as work and relationships.  Causes distress that makes him or her unable to take part in normal activities.  Includes at least three symptoms of GAD, such as restlessness, fatigue, trouble concentrating, irritability, muscle tension, or sleep problems. Before your health care provider can confirm a diagnosis of GAD, these symptoms must be present more days than they are not, and they must last for 6 months or longer. How is this treated? This condition may be treated with:  Medicine. Antidepressant medicine is usually prescribed for long-term daily control. Anti-anxiety medicines may be added in severe cases, especially when panic attacks occur.  Talk therapy (psychotherapy). Certain types of talk therapy can be helpful in treating GAD by providing support, education, and guidance. Options include: ? Cognitive behavioral therapy (CBT). People learn coping skills and self-calming techniques to ease their physical symptoms. They learn to identify unrealistic thoughts and behaviors and to replace them with more appropriate thoughts and behaviors. ? Acceptance and commitment therapy (ACT). This treatment teaches people how to be mindful as a way to cope with unwanted thoughts and feelings. ? Biofeedback. This process trains you to manage your body's response (physiological response) through breathing techniques and relaxation methods. You will work with a therapist while machines are used to monitor your physical   symptoms.  Stress management techniques. These include yoga,  meditation, and exercise. A mental health specialist can help determine which treatment is best for you. Some people see improvement with one type of therapy. However, other people require a combination of therapies.   Follow these instructions at home: Lifestyle  Maintain a consistent routine and schedule.  Anticipate stressful situations. Create a plan, and allow extra time to work with your plan.  Practice stress management or self-calming techniques that you have learned from your therapist or your health care provider. General instructions  Take over-the-counter and prescription medicines only as told by your health care provider.  Understand that you are likely to have setbacks. Accept this and be kind to yourself as you persist to take better care of yourself.  Recognize and accept your accomplishments, even if you judge them as small.  Keep all follow-up visits as told by your health care provider. This is important. Contact a health care provider if:  Your symptoms do not get better.  Your symptoms get worse.  You have signs of depression, such as: ? A persistently sad or irritable mood. ? Loss of enjoyment in activities that used to bring you joy. ? Change in weight or eating. ? Changes in sleeping habits. ? Avoiding friends or family members. ? Loss of energy for normal tasks. ? Feelings of guilt or worthlessness. Get help right away if:  You have serious thoughts about hurting yourself or others. If you ever feel like you may hurt yourself or others, or have thoughts about taking your own life, get help right away. Go to your nearest emergency department or:  Call your local emergency services (911 in the [place]  Call a suicide crisis helpline, such as the National Suicide Prevention Lifeline at 1-800-273-8255. This is open 24 hours a day in the [place]  Text the Crisis Text Line at 741741 (in the [place] Summary  Generalized anxiety disorder (GAD) is a mental  health condition that involves worry that is not triggered by a specific event.  People with GAD often worry excessively about many things in their lives, such as their health and family.  GAD may cause symptoms such as restlessness, trouble concentrating, sleep problems, frequent sweating, nausea, diarrhea, headaches, and trembling or muscle twitching.  A mental health specialist can help determine which treatment is best for you. Some people see improvement with one type of therapy. However, other people require a combination of therapies. This information is not intended to replace advice given to you by your health care provider. Make sure you discuss any questions you have with your health care provider. Document Revised: 03/15/2019 Document Reviewed: 03/15/2019 Elsevier Patient Education  2021 [employer] Inc.  

## 2020-09-12 ENCOUNTER — Encounter: Payer: Self-pay | Admitting: Family

## 2020-09-12 ENCOUNTER — Other Ambulatory Visit: Payer: Self-pay

## 2020-09-12 ENCOUNTER — Ambulatory Visit: Payer: 59 | Admitting: Family

## 2020-09-12 VITALS — BP 134/80 | HR 97 | Temp 97.7°F | Ht 68.0 in | Wt 357.9 lb

## 2020-09-12 DIAGNOSIS — F411 Generalized anxiety disorder: Secondary | ICD-10-CM

## 2020-09-12 DIAGNOSIS — L409 Psoriasis, unspecified: Secondary | ICD-10-CM | POA: Insufficient documentation

## 2020-09-12 DIAGNOSIS — F320 Major depressive disorder, single episode, mild: Secondary | ICD-10-CM

## 2020-09-12 MED ORDER — FLUOXETINE HCL 40 MG PO CAPS: 40.0000 mg | ORAL_CAPSULE | Freq: Every day | ORAL | 3 refills | Status: DC

## 2020-09-12 MED ORDER — CLOBETASOL PROPIONATE 0.05 % EX CREA: 1.0000 "application " | TOPICAL_CREAM | Freq: Two times a day (BID) | CUTANEOUS | 2 refills | Status: DC

## 2020-09-12 NOTE — Progress Notes (Signed)
Subjective:    Patient ID: Rebecca Gardner, female    DOB: 1996-05-16, 25 y.o.   MRN: [id number]  Chief Complaint  Patient presents with  . Follow-up    Anxiety medication   PT presents to the office today to recheck GAD and Depression today. She was seen on 08/15/20 and we started her on Lexapro 10 mg. States this has helped with irritability , but continues to have anxiety. She also reports a daily headache when taking lexapro.   She also has psoriasis that flares up. Requesting a cream to apply.  Anxiety Presents for follow-up visit. Symptoms include depressed mood, excessive worry, irritability, nervous/anxious behavior and restlessness. Symptoms occur most days. The severity of symptoms is moderate.    Depression        This is a chronic problem.  The current episode started more than 1 year ago.   The onset quality is gradual.   The problem occurs intermittently.  Associated symptoms include helplessness, hopelessness, irritable, restlessness and sad.  Past treatments include SSRIs - Selective serotonin reuptake inhibitors.  Compliance with treatment is good.  Past medical history includes anxiety.       Review of Systems  Constitutional: Positive for irritability.  Psychiatric/Behavioral: Positive for depression. The patient is nervous/anxious.   All other systems reviewed and are negative.      Objective:   Physical Exam Vitals reviewed.  Constitutional:      General: She is irritable. She is not in acute distress.    Appearance: She is well-developed. She is obese.  HENT:     Head: Normocephalic and atraumatic.     Right Ear: Tympanic membrane normal.     Left Ear: Tympanic membrane normal.  Eyes:     Pupils: Pupils are equal, round, and reactive to light.  Neck:     Thyroid: No thyromegaly.  Cardiovascular:     Rate and Rhythm: Normal rate and regular rhythm.     Heart sounds: Normal heart sounds. No murmur heard.   Pulmonary:     Effort: Pulmonary effort  is normal. No respiratory distress.     Breath sounds: Normal breath sounds. No wheezing.  Abdominal:     General: Bowel sounds are normal. There is no distension.     Palpations: Abdomen is soft.     Tenderness: There is no abdominal tenderness.  Musculoskeletal:        General: No tenderness. Normal range of motion.     Cervical back: Normal range of motion and neck supple.  Skin:    General: Skin is warm and dry.  Neurological:     Mental Status: She is alert and oriented to person, place, and time.     Cranial Nerves: No cranial nerve deficit.     Deep Tendon Reflexes: Reflexes are normal and symmetric.  Psychiatric:        Behavior: Behavior normal.        Thought Content: Thought content normal.        Judgment: Judgment normal.       BP 134/80   Pulse 97   Temp 97.7 F (36.5 C) (Temporal)   Ht 5\' 8"  (1.727 m)   Wt (!) 357 lb 14.4 oz (162.3 kg)   LMP 08/15/2020   SpO2 98%   BMI 54.42 kg/m      Assessment & Plan:  Kae A Casler comes in today with chief complaint of Follow-up (Anxiety medication)   Diagnosis and orders addressed:  1.  Depression, major, single episode, mild (HCC) Will stop Lexapro 10 mg and start Prozac 40 mg Stress management discussed RTO in 4-6 weeks  - FLUoxetine (PROZAC) 40 MG capsule; Take 1 capsule (40 mg total) by mouth daily.  Dispense: 90 capsule; Refill: 3  2. GAD (generalized anxiety disorder) Will stop Lexapro 10 mg and start Prozac 40 mg Stress management discussed RTO in 4-6 weeks  - FLUoxetine (PROZAC) 40 MG capsule; Take 1 capsule (40 mg total) by mouth daily.  Dispense: 90 capsule; Refill: 3  3. Morbid obesity (HCC)  4. Psoriasis Apply clobetasol cream - clobetasol cream (TEMOVATE) 0.05 %; Apply 1 application topically 2 (two) times daily.  Dispense: 60 g; Refill: 2   Health Maintenance reviewed Diet and exercise encouraged  Follow up plan: 4-6 weeks    Jannifer Rodney, FNP

## 2020-09-12 NOTE — Patient Instructions (Signed)
[email or web address]">  Generalized Anxiety Disorder, Adult Generalized anxiety disorder (GAD) is a mental health condition. Unlike normal worries, anxiety related to GAD is not triggered by a specific event. These worries do not fade or get better with time. GAD interferes with relationships, work, and school. GAD symptoms can vary from mild to severe. People with severe GAD can have intense waves of anxiety with physical symptoms that are similar to panic attacks. What are the causes? The exact cause of GAD is not known, but the following are believed to have an impact:  Differences in natural brain chemicals.  Genes passed down from parents to children.  Differences in the way threats are perceived.  Development during childhood.  Personality. What increases the risk? The following factors may make you more likely to develop this condition:  Being female.  Having a family history of anxiety disorders.  Being very shy.  Experiencing very stressful life events, such as the death of a loved one.  Having a very stressful family environment. What are the signs or symptoms? People with GAD often worry excessively about many things in their lives, such as their health and family. Symptoms may also include:  Mental and emotional symptoms: ? Worrying excessively about natural disasters. ? Fear of being late. ? Difficulty concentrating. ? Fears that others are judging your performance.  Physical symptoms: ? Fatigue. ? Headaches, muscle tension, muscle twitches, trembling, or feeling shaky. ? Feeling like your heart is pounding or beating very fast. ? Feeling out of breath or like you cannot take a deep breath. ? Having trouble falling asleep or staying asleep, or experiencing restlessness. ? Sweating. ? Nausea, diarrhea, or irritable bowel syndrome (IBS).  Behavioral symptoms: ? Experiencing erratic moods or irritability. ? Avoidance of new situations. ? Avoidance of  people. ? Extreme difficulty making decisions. How is this diagnosed? This condition is diagnosed based on your symptoms and medical history. You will also have a physical exam. Your health care provider may perform tests to rule out other possible causes of your symptoms. To be diagnosed with GAD, a person must have anxiety that:  Is out of his or her control.  Affects several different aspects of his or her life, such as work and relationships.  Causes distress that makes him or her unable to take part in normal activities.  Includes at least three symptoms of GAD, such as restlessness, fatigue, trouble concentrating, irritability, muscle tension, or sleep problems. Before your health care provider can confirm a diagnosis of GAD, these symptoms must be present more days than they are not, and they must last for 6 months or longer. How is this treated? This condition may be treated with:  Medicine. Antidepressant medicine is usually prescribed for long-term daily control. Anti-anxiety medicines may be added in severe cases, especially when panic attacks occur.  Talk therapy (psychotherapy). Certain types of talk therapy can be helpful in treating GAD by providing support, education, and guidance. Options include: ? Cognitive behavioral therapy (CBT). People learn coping skills and self-calming techniques to ease their physical symptoms. They learn to identify unrealistic thoughts and behaviors and to replace them with more appropriate thoughts and behaviors. ? Acceptance and commitment therapy (ACT). This treatment teaches people how to be mindful as a way to cope with unwanted thoughts and feelings. ? Biofeedback. This process trains you to manage your body's response (physiological response) through breathing techniques and relaxation methods. You will work with a therapist while machines are used to monitor your physical   symptoms.  Stress management techniques. These include yoga,  meditation, and exercise. A mental health specialist can help determine which treatment is best for you. Some people see improvement with one type of therapy. However, other people require a combination of therapies.   Follow these instructions at home: Lifestyle  Maintain a consistent routine and schedule.  Anticipate stressful situations. Create a plan, and allow extra time to work with your plan.  Practice stress management or self-calming techniques that you have learned from your therapist or your health care provider. General instructions  Take over-the-counter and prescription medicines only as told by your health care provider.  Understand that you are likely to have setbacks. Accept this and be kind to yourself as you persist to take better care of yourself.  Recognize and accept your accomplishments, even if you judge them as small.  Keep all follow-up visits as told by your health care provider. This is important. Contact a health care provider if:  Your symptoms do not get better.  Your symptoms get worse.  You have signs of depression, such as: ? A persistently sad or irritable mood. ? Loss of enjoyment in activities that used to bring you joy. ? Change in weight or eating. ? Changes in sleeping habits. ? Avoiding friends or family members. ? Loss of energy for normal tasks. ? Feelings of guilt or worthlessness. Get help right away if:  You have serious thoughts about hurting yourself or others. If you ever feel like you may hurt yourself or others, or have thoughts about taking your own life, get help right away. Go to your nearest emergency department or:  Call your local emergency services (911 in the [place]  Call a suicide crisis helpline, such as the National Suicide Prevention Lifeline at 1-800-273-8255. This is open 24 hours a day in the [place]  Text the Crisis Text Line at 741741 (in the [place] Summary  Generalized anxiety disorder (GAD) is a mental  health condition that involves worry that is not triggered by a specific event.  People with GAD often worry excessively about many things in their lives, such as their health and family.  GAD may cause symptoms such as restlessness, trouble concentrating, sleep problems, frequent sweating, nausea, diarrhea, headaches, and trembling or muscle twitching.  A mental health specialist can help determine which treatment is best for you. Some people see improvement with one type of therapy. However, other people require a combination of therapies. This information is not intended to replace advice given to you by your health care provider. Make sure you discuss any questions you have with your health care provider. Document Revised: 03/15/2019 Document Reviewed: 03/15/2019 Elsevier Patient Education  2021 [employer] Inc.  

## 2020-10-07 ENCOUNTER — Encounter: Payer: Self-pay | Admitting: Family Medicine

## 2020-10-13 NOTE — Progress Notes (Signed)
Cardiology Office Note:    Date:  10/16/2020   ID:  [name], DOB April 21, 1996, MRN [id number]  PCP:  Junie Spencer, FNP   [facility] Providers Cardiologist:  None {   Referring MD: Junie Spencer, FNP    History of Present Illness:    Rebecca Gardner is a 25 y.o. female with a hx of depression, morbid obesity, and HTN who was referred by Jannifer Rodney for concern for palpitations and possible atrial fibrillation.   Patient states that last Friday while working on [employer], she developed sudden onset of palpitations with associated lightheadedness and SOB. Her apple watch informed her that she may be in Afib. HR at that time was 102. She was subsequently referred here for further management.  Today, the patient states that she has had palpitations in the past when she was especially when she was pregnant last year. She had an episode of syncope at that time. She was placed on a 7day monitor in 07/2019, which showed that she was in sinus tach, rare PVCs, with no arrhythmias. She was having palpitations at the time when the monitor was placed. Outside these two episodes detailed above, the patient has been having intermittent palpitations nearly daily lasting to 1hour. Tums can make the discomfort better, but the palpitations continue. Nothing seems to terminate the arrhythmia. No recent syncope. No chest pain, SOB.  Cardiac monitor 08/01/19 with NSR and sinus tachycardia, occasional PVCs. No sustained arrhythmias.  Strong family history of Afib: father (diagnosed very young at 10-7years old), grandmother, grandfather, aunt  Past Medical History:  Diagnosis Date  . Acute lateral meniscus tear of left knee 02/02/2014  . Acute medial meniscus tear of left knee   . Concussion with brief (less than one hour) loss of consciousness 4-28 15  . Depression   . Gall stones 03/27/2016  . Hypertension   . Nephrolithiasis     Past Surgical History:  Procedure Laterality Date  .  CHOLECYSTECTOMY N/A 04/10/2016   Procedure: LAPAROSCOPIC CHOLECYSTECTOMY;  Surgeon: Rodman Pickle, MD;  Location: [facility] OR;  Service: General;  Laterality: N/A;  . KNEE ARTHROSCOPY WITH LATERAL MENISECTOMY Left 02/02/2014   Procedure: LEFT KNEE ARTHROSCOPY WITH LATERAL MENISECTOMY;  Surgeon: Eulas Post, MD;  Location: [facility];  Service: Orthopedics;  Laterality: Left;  . TONSILLECTOMY      Current Medications: Current Meds  Medication Sig  . acetaminophen (TYLENOL) 325 MG tablet Take 650 mg by mouth every 6 (six) hours as needed for headache.  Marland Kitchen amLODipine (NORVASC) 5 MG tablet Take 1.5 tablets (7.5 mg total) by mouth daily.  . clobetasol cream (TEMOVATE) 0.05 % Apply 1 application topically 2 (two) times daily.  Marland Kitchen FLUoxetine (PROZAC) 40 MG capsule Take 1 capsule (40 mg total) by mouth daily.  . norgestimate-ethinyl estradiol (SPRINTEC 28) 0.25-35 MG-MCG tablet Take 1 tablet by mouth daily.  . [DISCONTINUED] amLODipine (NORVASC) 5 MG tablet Take 1 tablet (5 mg total) by mouth daily.     Allergies:   Patient has no known allergies.   Social History   Socioeconomic History  . Marital status: Single    Spouse name: Not on file  . Number of children: 0  . Years of education: college  . Highest education level: Associate degree: occupational, Scientist, product/process development, or vocational program  Occupational History  . Occupation: Lawyer  Tobacco Use  . Smoking status: Never Smoker  . Smokeless tobacco: Never Used  Vaping Use  .  Vaping Use: Never used  Substance and Sexual Activity  . Alcohol use: No  . Drug use: No  . Sexual activity: Yes    Birth control/protection: Condom, Injection  Other Topics Concern  . Not on file  Social History Narrative  . Not on file   Social Determinants of Health   Financial Resource Strain: Not on file  Food Insecurity: Not on file  Transportation Needs: Not on file  Physical Activity: Not on file  Stress: Not on file   Social Connections: Not on file     Family History: The patient's family history includes Atrial fibrillation in her father; Breast cancer in her paternal grandmother; COPD in her maternal uncle; Cancer in her paternal grandfather; Healthy in her mother; Jaundice in her daughter; Migraines in her maternal uncle; Prostate cancer in her paternal grandfather.  ROS:   Please see the history of present illness.    Review of Systems  Constitutional: Negative for chills and fever.  HENT: Negative for hearing loss.   Eyes: Negative for blurred vision and redness.  Respiratory: Positive for shortness of breath.   Cardiovascular: Positive for palpitations. Negative for chest pain, orthopnea, claudication, leg swelling and PND.  Gastrointestinal: Positive for heartburn. Negative for blood in stool, constipation and melena.  Genitourinary: Negative for dysuria and flank pain.  Musculoskeletal: Negative for falls.  Neurological: Positive for dizziness. Negative for loss of consciousness.  Endo/Heme/Allergies: Negative for polydipsia.  Psychiatric/Behavioral: Negative for substance abuse.    EKGs/Labs/Other Studies Reviewed:    The following studies were reviewed today: Cardiac monitor: 08/01/19:  Sinus rhythm and sinus tachycardia, average heart rate 103 bpm. Occasional PVCs. Symptoms correlated with sinus tachycardia.  EKG:  EKG is  ordered today.  The ekg ordered today demonstrates NSR with HR 96  Recent Labs: No results found for requested labs within last 8760 hours.  Recent Lipid Panel    Component Value Date/Time   CHOL 190 07/06/2016 1257   TRIG 123 07/06/2016 1257   HDL 44 07/06/2016 1257   CHOLHDL 4.3 07/06/2016 1257   LDLCALC 121 (H) 07/06/2016 1257     Physical Exam:    VS:  BP 138/78   Pulse 96   Ht 5\' 8"  (1.727 m)   Wt (!) 362 lb 12.8 oz (164.6 kg)   SpO2 97%   BMI 55.16 kg/m     Wt Readings from Last 3 Encounters:  10/16/20 (!) 362 lb 12.8 oz (164.6 kg)   09/12/20 (!) 357 lb 14.4 oz (162.3 kg)  08/15/20 (!) 354 lb 12.8 oz (160.9 kg)     GEN:  Well nourished, well developed in no acute distress HEENT: Normal NECK: No JVD; No carotid bruits CARDIAC: RRR, no murmurs, rubs, gallops RESPIRATORY:  Clear to auscultation without rales, wheezing or rhonchi  ABDOMEN: Obese, soft, non-tender, non-distended MUSCULOSKELETAL:  No edema; No deformity  SKIN: Warm and dry NEUROLOGIC:  Alert and oriented x 3 PSYCHIATRIC:  Normal affect   ASSESSMENT:    1. Palpitations   2. Primary hypertension    PLAN:    In order of problems listed above:  #Palpitations: Patient with several episodes of palpitations over the past couple of years. Specifically, had a significant episode on Friday while at work and her apple watch said she was in atrial fibrillation. Review of the strip appears to be sinus with SVEs. No ECG obtained at that time. Has had prior significant episodes in the past most notably with pregnancy in 2021. Monitor at  that time showed NSR-sinus tach, rare PVCs no arrhythmias. Continues to have daily intermittent symptoms. Has a very strong history of Afib with father diagnosed at age 63-12 year. Will obtain cardiac monitor and TTE at this time for further work-up. -Check 7 day zio monitor -Check TTE -Check TSH and CBC -Pending above results, can do low dose metop  #HTN: Blood pressure elevated 130-140s at home -Increase amlodipine to 7.5mg  daily; can adjust as needed if metop added    Medication Adjustments/Labs and Tests Ordered: Current medicines are reviewed at length with the patient today.  Concerns regarding medicines are outlined above.  Orders Placed This Encounter  Procedures  . CBC  . TSH  . LONG TERM MONITOR (3-14 DAYS)  . EKG 12-Lead  . ECHOCARDIOGRAM COMPLETE   Meds ordered this encounter  Medications  . amLODipine (NORVASC) 5 MG tablet    Sig: Take 1.5 tablets (7.5 mg total) by mouth daily.    Dispense:  135 tablet     Refill:  1    Dose increase    Patient Instructions  Medication Instructions:   INCREASE YOUR AMLODIPINE TO TAKING 1 AND 1/2 TABLETS (7.5 MG TOTAL) BY MOUTH DAILY  *If you need a refill on your cardiac medications before your next appointment, please call your pharmacy*   Lab Work:  TODAY--CBC AND TSH  If you have labs (blood work) drawn today and your tests are completely normal, you will receive your results only by: Marland Kitchen MyChart Message (if you have MyChart) OR . A paper copy in the mail If you have any lab test that is abnormal or we need to change your treatment, we will call you to review the results.   Testing/Procedures:  Your physician has requested that you have an echocardiogram. Echocardiography is a painless test that uses sound waves to create images of your heart. It provides your doctor with information about the size and shape of your heart and how well your heart's chambers and valves are working. This procedure takes approximately one hour. There are no restrictions for this procedure.   ZIO XT- Long Term Monitor Instructions   Your physician has requested you wear a ZIO patch monitor for _7__ days.  This is a single patch monitor.   [employer] supplies one patch monitor per enrollment. Additional stickers are not available. Please do not apply patch if you will be having a Nuclear Stress Test, Echocardiogram, Cardiac CT, MRI, or Chest Xray during the period you would be wearing the monitor. The patch cannot be worn during these tests. You cannot remove and re-apply the ZIO XT patch monitor.  Your ZIO patch monitor will be sent Fed Ex from [employer] directly to your home address. It may take 3-5 days to receive your monitor after you have been enrolled.  Once you have received your monitor, please review the enclosed instructions. Your monitor has already been registered assigning a specific monitor serial # to you.  Billing and Patient Assistance Program  Information   We have supplied [employer] with any of your insurance information on file for billing purposes. [employer] offers a sliding scale Patient Assistance Program for patients that do not have insurance, or whose insurance does not completely cover the cost of the ZIO monitor.   You must apply for the Patient Assistance Program to qualify for this discounted rate.     To apply, please call [employer] at [phone], select option 4, then select option 2, and ask to apply for Patient  Assistance Program.  Meredeth Ide will ask your household income, and how many people are in your household.  They will quote your out-of-pocket cost based on that information.  [employer] will also be able to set up a 79-month, interest-free payment plan if needed.  Applying the monitor   Shave hair from upper left chest.  Hold abrader disc by orange tab. Rub abrader in 40 strokes over the upper left chest as indicated in your monitor instructions.  Clean area with 4 enclosed alcohol pads. Let dry.  Apply patch as indicated in monitor instructions. Patch will be placed under collarbone on left side of chest with arrow pointing upward.  Rub patch adhesive wings for 2 minutes. Remove white label marked "1". Remove the white label marked "2". Rub patch adhesive wings for 2 additional minutes.  While looking in a mirror, press and release button in center of patch. A small green light will flash 3-4 times. This will be your only indicator that the monitor has been turned on. ?  Do not shower for the first 24 hours. You may shower after the first 24 hours.  Press the button if you feel a symptom. You will hear a small click. Record Date, Time and Symptom in the Patient Logbook.  When you are ready to remove the patch, follow instructions on the last 2 pages of the Patient Logbook. Stick patch monitor onto the last page of Patient Logbook.  Place Patient Logbook in the blue and white box.  Use locking tab on box and tape box closed  securely.  The blue and white box has prepaid postage on it. Please place it in the mailbox as soon as possible. Your physician should have your test results approximately 7 days after the monitor has been mailed back to [facility].  Call [facility] Customer Care at [phone] if you have questions regarding your ZIO XT patch monitor. Call them immediately if you see an orange light blinking on your monitor.  If your monitor falls off in less than 4 days, contact our Monitor department at [phone]. ?If your monitor becomes loose or falls off after 4 days call [employer] at [phone] for suggestions on securing your monitor.?    Follow-Up:  3 MONTHS IN THE OFFICE WITH DR. Chales Salmon TO ANY OPEN SLOT PER DR. Shari Prows       Signed, Meriam Sprague, MD  10/16/2020 12:11 PM    [facility]

## 2020-10-16 ENCOUNTER — Other Ambulatory Visit: Payer: Self-pay

## 2020-10-16 ENCOUNTER — Ambulatory Visit (INDEPENDENT_AMBULATORY_CARE_PROVIDER_SITE_OTHER): Payer: 59

## 2020-10-16 ENCOUNTER — Ambulatory Visit: Payer: 59 | Admitting: Cardiology

## 2020-10-16 ENCOUNTER — Telehealth: Payer: Self-pay | Admitting: *Deleted

## 2020-10-16 ENCOUNTER — Encounter: Payer: Self-pay | Admitting: Cardiology

## 2020-10-16 ENCOUNTER — Encounter: Payer: Self-pay | Admitting: Radiology

## 2020-10-16 VITALS — BP 138/78 | HR 96 | Ht 68.0 in | Wt 362.8 lb

## 2020-10-16 DIAGNOSIS — I10 Essential (primary) hypertension: Secondary | ICD-10-CM

## 2020-10-16 DIAGNOSIS — R002 Palpitations: Secondary | ICD-10-CM

## 2020-10-16 LAB — CBC
Hematocrit: 40.9 % (ref 34.0–46.6)
Hemoglobin: 13.4 g/dL (ref 11.1–15.9)
MCH: 26.4 pg — ABNORMAL LOW (ref 26.6–33.0)
MCHC: 32.8 g/dL (ref 31.5–35.7)
MCV: 81 fL (ref 79–97)
Platelets: 348 10*3/uL (ref 150–450)
RBC: 5.07 x10E6/uL (ref 3.77–5.28)
RDW: 13.4 % (ref 11.7–15.4)
WBC: 11.3 10*3/uL — ABNORMAL HIGH (ref 3.4–10.8)

## 2020-10-16 LAB — TSH: TSH: 2.37 u[IU]/mL (ref 0.450–4.500)

## 2020-10-16 MED ORDER — AMLODIPINE BESYLATE 5 MG PO TABS: 7.5000 mg | ORAL_TABLET | Freq: Every day | ORAL | 1 refills | Status: DC

## 2020-10-16 NOTE — Patient Instructions (Signed)
Medication Instructions:   INCREASE YOUR AMLODIPINE TO TAKING 1 AND 1/2 TABLETS (7.5 MG TOTAL) BY MOUTH DAILY  *If you need a refill on your cardiac medications before your next appointment, please call your pharmacy*   Lab Work:  TODAY--CBC AND TSH  If you have labs (blood work) drawn today and your tests are completely normal, you will receive your results only by: Marland Kitchen MyChart Message (if you have MyChart) OR . A paper copy in the mail If you have any lab test that is abnormal or we need to change your treatment, we will call you to review the results.   Testing/Procedures:  Your physician has requested that you have an echocardiogram. Echocardiography is a painless test that uses sound waves to create images of your heart. It provides your doctor with information about the size and shape of your heart and how well your heart's chambers and valves are working. This procedure takes approximately one hour. There are no restrictions for this procedure.   ZIO XT- Long Term Monitor Instructions   Your physician has requested you wear a ZIO patch monitor for _7__ days.  This is a single patch monitor.   [employer] supplies one patch monitor per enrollment. Additional stickers are not available. Please do not apply patch if you will be having a Nuclear Stress Test, Echocardiogram, Cardiac CT, MRI, or Chest Xray during the period you would be wearing the monitor. The patch cannot be worn during these tests. You cannot remove and re-apply the ZIO XT patch monitor.  Your ZIO patch monitor will be sent Fed Ex from [employer] directly to your home address. It may take 3-5 days to receive your monitor after you have been enrolled.  Once you have received your monitor, please review the enclosed instructions. Your monitor has already been registered assigning a specific monitor serial # to you.  Billing and Patient Assistance Program Information   We have supplied [employer] with any of your  insurance information on file for billing purposes. [employer] offers a sliding scale Patient Assistance Program for patients that do not have insurance, or whose insurance does not completely cover the cost of the ZIO monitor.   You must apply for the Patient Assistance Program to qualify for this discounted rate.     To apply, please call [employer] at [phone], select option 4, then select option 2, and ask to apply for Patient Assistance Program.  Meredeth Ide will ask your household income, and how many people are in your household.  They will quote your out-of-pocket cost based on that information.  [employer] will also be able to set up a 62-month, interest-free payment plan if needed.  Applying the monitor   Shave hair from upper left chest.  Hold abrader disc by orange tab. Rub abrader in 40 strokes over the upper left chest as indicated in your monitor instructions.  Clean area with 4 enclosed alcohol pads. Let dry.  Apply patch as indicated in monitor instructions. Patch will be placed under collarbone on left side of chest with arrow pointing upward.  Rub patch adhesive wings for 2 minutes. Remove white label marked "1". Remove the white label marked "2". Rub patch adhesive wings for 2 additional minutes.  While looking in a mirror, press and release button in center of patch. A small green light will flash 3-4 times. This will be your only indicator that the monitor has been turned on. ?  Do not shower for the first 24 hours. You may  shower after the first 24 hours.  Press the button if you feel a symptom. You will hear a small click. Record Date, Time and Symptom in the Patient Logbook.  When you are ready to remove the patch, follow instructions on the last 2 pages of the Patient Logbook. Stick patch monitor onto the last page of Patient Logbook.  Place Patient Logbook in the blue and white box.  Use locking tab on box and tape box closed securely.  The blue and white box has prepaid postage on  it. Please place it in the mailbox as soon as possible. Your physician should have your test results approximately 7 days after the monitor has been mailed back to [facility].  Call [facility] Customer Care at [phone] if you have questions regarding your ZIO XT patch monitor. Call them immediately if you see an orange light blinking on your monitor.  If your monitor falls off in less than 4 days, contact our Monitor department at [phone]. ?If your monitor becomes loose or falls off after 4 days call [employer] at [phone] for suggestions on securing your monitor.?    Follow-Up:  3 MONTHS IN THE OFFICE WITH DR. Chales Salmon TO ANY OPEN SLOT PER DR. Shari Prows

## 2020-10-16 NOTE — Telephone Encounter (Signed)
-----   Message from Delynn Flavin sent at 10/16/2020  9:11 AM EDT ----- Regarding: RE: 7 DAY ZIO Done :-) ----- Message ----- From: Loa Socks, LPN Sent: 1/69/6789   8:59 AM EDT To: Loa Socks, LPN, Ernst Bowler, # Subject: 7 DAY ZIO                                      Dr. Shari Prows wants this pt to have a 7 day zio for palpitations.  Order is in and pt aware you will enroll. Can you please enroll and let me know?  Thanks, [employer]

## 2020-10-16 NOTE — Progress Notes (Signed)
Enrolled patient for a 7 day Zio XT monitor to be mailed to patients home.  

## 2020-10-17 ENCOUNTER — Ambulatory Visit (INDEPENDENT_AMBULATORY_CARE_PROVIDER_SITE_OTHER): Payer: 59 | Admitting: Family

## 2020-10-17 ENCOUNTER — Encounter: Payer: Self-pay | Admitting: Family

## 2020-10-17 VITALS — BP 134/79 | HR 97 | Temp 97.7°F | Ht 68.0 in | Wt 359.4 lb

## 2020-10-17 DIAGNOSIS — F320 Major depressive disorder, single episode, mild: Secondary | ICD-10-CM | POA: Diagnosis not present

## 2020-10-17 DIAGNOSIS — F411 Generalized anxiety disorder: Secondary | ICD-10-CM | POA: Diagnosis not present

## 2020-10-17 DIAGNOSIS — Z0000 Encounter for general adult medical examination without abnormal findings: Secondary | ICD-10-CM

## 2020-10-17 DIAGNOSIS — Z0001 Encounter for general adult medical examination with abnormal findings: Secondary | ICD-10-CM

## 2020-10-17 DIAGNOSIS — I10 Essential (primary) hypertension: Secondary | ICD-10-CM | POA: Diagnosis not present

## 2020-10-17 MED ORDER — BUPROPION HCL ER (XL) 150 MG PO TB24: 150.0000 mg | ORAL_TABLET | Freq: Every day | ORAL | 4 refills | Status: DC

## 2020-10-17 NOTE — Patient Instructions (Signed)
Major Depressive Disorder, Adult Major depressive disorder (MDD) is a mental health condition. It may also be called clinical depression or unipolar depression. MDD causes symptoms of sadness, hopelessness, and loss of interest in things. These symptoms last most of the day, almost every day, for 2 weeks. MDD can also cause physical symptoms. It can interfere with relationships and with everyday activities, such as work, school, and activities that are usually pleasant. MDD may be mild, moderate, or severe. It may be single-episode MDD, which happens once, or recurrent MDD, which may occur multiple times. What are the causes? The exact cause of this condition is not known. MDD is most likely caused by a combination of things, which may include:  Your personality traits.  Learned or conditioned behaviors or thoughts or feelings that reinforce negativity.  Any alcohol or substance misuse.  Long-term (chronic) physical or mental health illness.  Going through a traumatic experience or major life changes. What increases the risk? The following factors may make someone more likely to develop MDD:  A family history of depression.  Being a woman.  Troubled family relationships.  Abnormally low levels of certain brain chemicals.  Traumatic or painful events in childhood, especially abuse or loss of a parent.  A lot of stress from life experiences, such as poor living conditions or discrimination.  Chronic physical illness or other mental health disorders. What are the signs or symptoms? The main symptoms of MDD usually include:  Constant depressed or irritable mood.  A loss of interest in things and activities. Other symptoms include:  Sleeping or eating too much or too little.  Unexplained weight gain or weight loss.  Tiredness or low energy.  Being agitated, restless, or weak.  Feeling hopeless, worthless, or guilty.  Trouble thinking clearly or making  decisions.  Thoughts of suicide or thoughts of harming others.  Isolating oneself or avoiding other people or activities.  Trouble completing tasks, work, or any normal obligations. Severe symptoms of this condition may include:  Psychotic depression.This may include false beliefs, or delusions. It may also include seeing, hearing, tasting, smelling, or feeling things that are not real (hallucinations).  Chronic depression or persistent depressive disorder. This is low-level depression that lasts for at least 2 years.  Melancholic depression, or feeling extremely sad and hopeless.  Catatonic depression, which includes trouble speaking and trouble moving. How is this diagnosed? This condition may be diagnosed based on:  Your symptoms.  Your medical and mental health history. You may be asked questions about your lifestyle, including any drug and alcohol use.  A physical exam.  Blood tests to rule out other conditions. MDD is confirmed if you have the following symptoms most of the day, nearly every day, in a 2-week period:  Either a depressed mood or loss of interest.  At least four other MDD symptoms. How is this treated? This condition is usually treated by mental health professionals, such as psychologists, psychiatrists, and clinical social workers. You may need more than one type of treatment. Treatment may include:  Psychotherapy, also called talk therapy or counseling. Types of psychotherapy include: ? Cognitive behavioral therapy (CBT). This teaches you to recognize unhealthy feelings, thoughts, and behaviors, and replace them with positive thoughts and actions. ? Interpersonal therapy (IPT). This helps you to improve the way you communicate with others or relate to them. ? Family therapy. This treatment includes members of your family.  Medicines to treat anxiety and depression. These medicines help to balance the brain chemicals   that affect your emotions.  Lifestyle  changes. You may be asked to: ? Limit alcohol use and avoid drug use. ? Get regular exercise. ? Get plenty of sleep. ? Make healthy eating choices. ? Spend more time outdoors.  Brain stimulation. This may be done if symptoms are very severe and other treatments have not worked. Examples of this treatment are electroconvulsive therapy and transcranial magnetic stimulation. Follow these instructions at home: Activity  Exercise regularly and spend time outdoors.  Find activities that you enjoy doing, and make time to do them.  Find healthy ways to manage stress, such as: ? Meditation or deep breathing. ? Spending time in nature. ? Journaling.  Return to your normal activities as told by your health care provider. Ask your health care provider what activities are safe for you. Alcohol and drug use  If you drink alcohol: ? Limit how much you use to:  0-1 drink a day for women who are not pregnant.  0-2 drinks a day for men. ? Be aware of how much alcohol is in your drink. In the [place] one drink equals one 12 oz bottle of beer (355 mL), one 5 oz glass of wine (148 mL), or one 1 oz glass of hard liquor (44 mL). ? Discuss your alcohol use with your health care provider. Alcohol can affect any antidepressant medicines you are taking.  Discuss any drug use with your health care provider. General instructions  Take over-the-counter and prescription medicines only as told by your health care provider.  Eat a healthy diet and get plenty of sleep.  Consider joining a support group. Your health care provider may be able to recommend one.  Keep all follow-up visits as told by your health care provider. This is important.   Where to find more information  National Alliance on Mental Illness: www.nami.org  [place] National Institute of Mental Health: www.nimh.nih.gov Contact a health care provider if:  Your symptoms get worse.  You develop new symptoms. Get help right away if:  You  self-harm.  You have serious thoughts about hurting yourself or others.  You hallucinate. If you ever feel like you may hurt yourself or others, or have thoughts about taking your own life, get help right away. Go to your nearest emergency department or:  Call your local emergency services (911 in the [place]  Call a suicide crisis helpline, such as the National Suicide Prevention Lifeline at 1-800-273-8255. This is open 24 hours a day in the [place]  Text the Crisis Text Line at 741741 (in the [place] Summary  Major depressive disorder (MDD) is a mental health condition. MDD causes symptoms of sadness, hopelessness, and loss of interest in things. These symptoms last most of the day, almost every day, for 2 weeks.  The symptoms of MDD can interfere with relationships and with everyday activities.  Treatments and support are available for people who develop MDD. You may need more than one type of treatment.  Get help right away if you have serious thoughts about hurting yourself or others. This information is not intended to replace advice given to you by your health care provider. Make sure you discuss any questions you have with your health care provider. Document Revised: 05/06/2019 Document Reviewed: 05/06/2019 Elsevier Patient Education  2021 Elsevier Inc.  

## 2020-10-17 NOTE — Progress Notes (Signed)
Subjective:    Patient ID: Rebecca Gardner, female    DOB: 03-Jan-1996, 25 y.o.   MRN: [id number]  Chief Complaint  Patient presents with  . Annual Exam    No pap    Pt presents to the office today for CPE without pap. She is followed by Cardiologists for heart palpations and HTN. She had a CBC and TSH drawn yesterday.  Hypertension This is a chronic problem. The current episode started more than 1 year ago. The problem has been resolved since onset. Associated symptoms include anxiety and malaise/fatigue. Pertinent negatives include no peripheral edema or shortness of breath. Risk factors for coronary artery disease include dyslipidemia, obesity and sedentary lifestyle. Past treatments include calcium channel blockers. The current treatment provides moderate improvement. There is no history of CVA or heart failure.  Depression        This is a chronic problem.  The current episode started more than 1 year ago.   The problem occurs intermittently.  Associated symptoms include irritable, restlessness, decreased interest and sad.  Associated symptoms include no helplessness and no hopelessness.  Past treatments include SSRIs - Selective serotonin reuptake inhibitors.  Past medical history includes anxiety.   Anxiety Presents for follow-up visit. Symptoms include depressed mood, excessive worry, irritability, nervous/anxious behavior and restlessness. Patient reports no shortness of breath. Symptoms occur occasionally. The severity of symptoms is moderate.        Review of Systems  Constitutional: Positive for irritability and malaise/fatigue.  Respiratory: Negative for shortness of breath.   Psychiatric/Behavioral: Positive for depression. The patient is nervous/anxious.   All other systems reviewed and are negative.  Family History  Problem Relation Age of Onset  . Breast cancer Paternal Grandmother   . Healthy Mother   . Atrial fibrillation Father   . Prostate cancer Paternal  Grandfather   . Cancer Paternal Grandfather        liver  . Migraines Maternal Uncle   . COPD Maternal Uncle   . Jaundice Daughter    Social History   Socioeconomic History  . Marital status: Single    Spouse name: Not on file  . Number of children: 0  . Years of education: college  . Highest education level: Associate degree: occupational, Hotel manager, or vocational program  Occupational History  . Occupation: Oceanographer  Tobacco Use  . Smoking status: Never Smoker  . Smokeless tobacco: Never Used  Vaping Use  . Vaping Use: Never used  Substance and Sexual Activity  . Alcohol use: No  . Drug use: No  . Sexual activity: Yes    Birth control/protection: Condom, Injection  Other Topics Concern  . Not on file  Social History Narrative  . Not on file   Social Determinants of Health   Financial Resource Strain: Not on file  Food Insecurity: Not on file  Transportation Needs: Not on file  Physical Activity: Not on file  Stress: Not on file  Social Connections: Not on file       Objective:   Physical Exam Vitals reviewed.  Constitutional:      General: She is irritable. She is not in acute distress.    Appearance: She is well-developed. She is obese.  HENT:     Head: Normocephalic and atraumatic.     Right Ear: Tympanic membrane normal.     Left Ear: Tympanic membrane normal.  Eyes:     Pupils: Pupils are equal, round, and reactive to light.  Neck:  Thyroid: No thyromegaly.  Cardiovascular:     Rate and Rhythm: Normal rate and regular rhythm.     Heart sounds: Normal heart sounds. No murmur heard.   Pulmonary:     Effort: Pulmonary effort is normal. No respiratory distress.     Breath sounds: Normal breath sounds. No wheezing.  Abdominal:     General: Bowel sounds are normal. There is no distension.     Palpations: Abdomen is soft.     Tenderness: There is no abdominal tenderness.  Musculoskeletal:        General: No tenderness. Normal range of  motion.     Cervical back: Normal range of motion and neck supple.  Skin:    General: Skin is warm and dry.  Neurological:     Mental Status: She is alert and oriented to person, place, and time.     Cranial Nerves: No cranial nerve deficit.     Deep Tendon Reflexes: Reflexes are normal and symmetric.  Psychiatric:        Behavior: Behavior normal.        Thought Content: Thought content normal.        Judgment: Judgment normal.       BP 134/79   Pulse 97   Temp 97.7 F (36.5 C) (Temporal)   Ht 5' 8"  (1.727 m)   Wt (!) 359 lb 6.4 oz (163 kg)   BMI 54.65 kg/m      Assessment & Plan:  Rebecca Gardner comes in today with chief complaint of Annual Exam (No pap )   Diagnosis and orders addressed:  1. Annual physical exam - CMP14+EGFR - Lipid panel  2. Depression, major, single episode, mild (HCC) Will add Wellburin 150 mg  Continue Prozac 40 mg today Stress management  - CMP14+EGFR - buPROPion (WELLBUTRIN XL) 150 MG 24 hr tablet; Take 1 tablet (150 mg total) by mouth daily.  Dispense: 90 tablet; Refill: 4  3. GAD (generalized anxiety disorder) - CMP14+EGFR - buPROPion (WELLBUTRIN XL) 150 MG 24 hr tablet; Take 1 tablet (150 mg total) by mouth daily.  Dispense: 90 tablet; Refill: 4  4. Morbid obesity ([place]) - CMP14+EGFR  5. Benign hypertension - CMP14+EGFR    Labs pending Health Maintenance reviewed Diet and exercise encouraged  Follow up plan: 6 weeks    Evelina Dun, FNP

## 2020-10-18 DIAGNOSIS — R002 Palpitations: Secondary | ICD-10-CM

## 2020-10-18 LAB — CMP14+EGFR
ALT: 13 IU/L (ref 0–32)
AST: 7 IU/L (ref 0–40)
Albumin/Globulin Ratio: 1.3 (ref 1.2–2.2)
Albumin: 4 g/dL (ref 3.9–5.0)
Alkaline Phosphatase: 56 IU/L (ref 44–121)
BUN/Creatinine Ratio: 12 (ref 9–23)
BUN: 8 mg/dL (ref 6–20)
Bilirubin Total: <0.2 mg/dL (ref 0.0–1.2)
CO2: 20 mmol/L (ref 20–29)
Calcium: 9.2 mg/dL (ref 8.7–10.2)
Chloride: 106 mmol/L (ref 96–106)
Creatinine, Ser: 0.65 mg/dL (ref 0.57–1.00)
Globulin, Total: 3.1 g/dL (ref 1.5–4.5)
Glucose: 78 mg/dL (ref 65–99)
Potassium: 4.7 mmol/L (ref 3.5–5.2)
Sodium: 142 mmol/L (ref 134–144)
Total Protein: 7.1 g/dL (ref 6.0–8.5)
eGFR: 125 mL/min/{1.73_m2} (ref >59)

## 2020-10-18 LAB — LIPID PANEL
Chol/HDL Ratio: 3.9 ratio (ref 0.0–4.4)
Cholesterol, Total: 190 mg/dL (ref 100–199)
HDL: 49 mg/dL (ref >39)
LDL Chol Calc (NIH): 113 mg/dL — ABNORMAL HIGH (ref 0–99)
Triglycerides: 158 mg/dL — ABNORMAL HIGH (ref 0–149)
VLDL Cholesterol Cal: 28 mg/dL (ref 5–40)

## 2020-11-01 DIAGNOSIS — R002 Palpitations: Secondary | ICD-10-CM | POA: Diagnosis not present

## 2020-11-13 ENCOUNTER — Ambulatory Visit (HOSPITAL_COMMUNITY): Payer: 59 | Attending: Cardiovascular Disease

## 2020-11-13 ENCOUNTER — Other Ambulatory Visit: Payer: Self-pay

## 2020-11-13 DIAGNOSIS — R002 Palpitations: Secondary | ICD-10-CM | POA: Insufficient documentation

## 2020-11-13 LAB — ECHOCARDIOGRAM COMPLETE
Area-P 1/2: 4.89 cm2
S' Lateral: 3.6 cm
Single Plane A2C EF: 56.2 %
Single Plane A4C EF: 51 %

## 2020-11-13 MED ORDER — PERFLUTREN LIPID MICROSPHERE
1.0000 mL | INTRAVENOUS | Status: AC | PRN
  Administered 2020-11-13: 2 mL via INTRAVENOUS

## 2020-11-14 ENCOUNTER — Telehealth: Payer: Self-pay | Admitting: *Deleted

## 2020-11-14 DIAGNOSIS — I517 Cardiomegaly: Secondary | ICD-10-CM

## 2020-11-14 DIAGNOSIS — Z0189 Encounter for other specified special examinations: Secondary | ICD-10-CM

## 2020-11-14 DIAGNOSIS — R931 Abnormal findings on diagnostic imaging of heart and coronary circulation: Secondary | ICD-10-CM

## 2020-11-14 DIAGNOSIS — R9430 Abnormal result of cardiovascular function study, unspecified: Secondary | ICD-10-CM

## 2020-11-14 NOTE — Telephone Encounter (Signed)
-----   Message from Meriam Sprague, MD sent at 11/14/2020  4:51 PM EDT ----- Sent MyChart message to the patient about her echo. Just needs cardiac MRI to assess decreased LVEF and LV dilation.

## 2020-11-14 NOTE — Telephone Encounter (Signed)
Pt made aware of echo results and recommendations to have a Cardiac MRI done via mychart message sent to her by Dr. Shari Prows.  Order for Cardiac MRI was placed and pt is aware that she will get a call back to have this arranged by our Cardiac MRI Scheduler.  Pt aware of plan.

## 2020-11-15 ENCOUNTER — Telehealth: Payer: Self-pay | Admitting: Cardiology

## 2020-11-15 NOTE — Telephone Encounter (Signed)
Spoke with patient regarding preferred scheduling weekdays and times for the Cardiac MRI ordered by Dr. Shari Prows.  Patient states she works 12 hours shifts and I suggested I have the radiology tech Haywood Lasso O'Donnell) call her directly to schedule to she states that was ok with her.  WIll have Lynette call patient

## 2020-12-16 ENCOUNTER — Encounter: Payer: 59 | Admitting: Family

## 2020-12-16 ENCOUNTER — Telehealth: Payer: Self-pay

## 2020-12-16 DIAGNOSIS — R931 Abnormal findings on diagnostic imaging of heart and coronary circulation: Secondary | ICD-10-CM

## 2020-12-16 DIAGNOSIS — R002 Palpitations: Secondary | ICD-10-CM

## 2020-12-16 DIAGNOSIS — R9430 Abnormal result of cardiovascular function study, unspecified: Secondary | ICD-10-CM

## 2020-12-16 DIAGNOSIS — R5383 Other fatigue: Secondary | ICD-10-CM

## 2020-12-16 DIAGNOSIS — I471 Supraventricular tachycardia: Secondary | ICD-10-CM

## 2020-12-16 DIAGNOSIS — Z79899 Other long term (current) drug therapy: Secondary | ICD-10-CM

## 2020-12-16 DIAGNOSIS — O99419 Diseases of the circulatory system complicating pregnancy, unspecified trimester: Secondary | ICD-10-CM

## 2020-12-16 DIAGNOSIS — I517 Cardiomegaly: Secondary | ICD-10-CM

## 2020-12-16 MED ORDER — METOPROLOL SUCCINATE ER 25 MG PO TB24: 25.0000 mg | ORAL_TABLET | Freq: Every day | ORAL | 3 refills | Status: DC

## 2020-12-16 NOTE — Telephone Encounter (Signed)
Patient Name: Rebecca Gardner        DOB: [birth year]      Height: 5'8"   Weight:350 lbs   Office Name:[facility]          Referring Provider: Dr. Lelon Frohlich  Today's Date: 12/16/20  Date:   STOP BANG RISK ASSESSMENT S (snore) Have you been told that you snore?     NO   T (tired) Are you often tired, fatigued, or sleepy during the day?   YES  O (obstruction) Do you stop breathing, choke, or gasp during sleep? YES   P (pressure) Do you have or are you being treated for high blood pressure? YES   B (BMI) Is your body index greater than 35 kg/m? YES   A (age) Are you 62 years old or older? NO   N (neck) Do you have a neck circumference greater than 16 inches?   NO   G (gender) Are you a female? NO   TOTAL STOP/BANG "YES" ANSWERS                                                                        For Office Use Only              Procedure Order Form    YES to 3+ Stop Bang questions OR two clinical symptoms - patient qualifies for WatchPAT (CPT 95800)             Clinical Notes: Will consult Sleep Specialist and refer for management of therapy due to patient increased risk of Sleep Apnea. Ordering a sleep study due to the following two clinical symptoms: Excessive daytime sleepiness G47.10 / Gastroesophageal reflux K21.9 / Nocturia R35.1 / Morning Headaches G44.221 / Difficulty concentrating R41.840 / Memory problems or poor judgment G31.84 / Personality changes or irritability R45.4 / Loud snoring R06.83 / Depression F32.9 / Unrefreshed by sleep G47.8 / Impotence N52.9 / History of high blood pressure R03.0 / Insomnia G47.00    I understand that I am proceeding with a home sleep apnea test as ordered by my treating physician. I understand that untreated sleep apnea is a serious cardiovascular risk factor and it is my responsibility to perform the test and seek management for sleep apnea. I will be contacted with the results and be managed for sleep apnea by a local sleep  physician. I will be receiving equipment and further instructions from [facility] I shall promptly ship back the equipment via the included mailing label. I understand my insurance will be billed for the test and as the patient I am responsible for any insurance related out-of-pocket costs incurred. I have been provided with written instructions and can call for additional video or telephonic instruction, with 24-hour availability of qualified personnel to answer any questions: Patient Help Desk [phone].  Patient Signature: Verbal consent  Date: 12/16/20 Patient Telemedicine Verbal Consent

## 2020-12-16 NOTE — Telephone Encounter (Signed)
-----   Message from Ivy M Martin, LPN sent at 12/14/2020  9:30 PM EDT -----  ----- Message ----- From: Price, Sherri L, RN Sent: 12/13/2020   5:35 PM EDT To: Ivy M Martin, LPN   ----- Message ----- From: Pemberton, Heather E, MD Sent: 12/13/2020   5:03 PM EDT To: [facility] Triage  Can we get her in for a sleep study for suspected OSA and start her on metop 25mg XL daily? If her blood pressures look okay on the metop, we will add entresto next? Is it possible to get her in with the PharmD for medication titration?  Thank you so, so much!  -Heather    

## 2020-12-16 NOTE — Telephone Encounter (Signed)
Pt agreed to Dr. Devin Going recommendations and is asking if her Pharm D appt can be virtual due to her distance to drive and having a baby at home.   I have asked her to keep a record of her BP readings after sitting for about 20 min with both feet on the ground especially after starling the metoprolol and she agrees.

## 2020-12-16 NOTE — Telephone Encounter (Signed)
Rudene Anda, RN s/w me and stated the pt cannot come in to be set up for an Itamar Sleep Study then she needs to be set up with regular sleep study. I confirmed this is correct and she will let Veda Canning, CMA and the sleep pool aware.

## 2020-12-17 NOTE — Telephone Encounter (Signed)
Pt scheduled for PharmD appt for 7/18 at 1100.  Veda Canning Sleep Coordinator to follow-up with the pt on arranging needed sleep study for OSA, per Dr. Shari Prows.

## 2020-12-17 NOTE — Telephone Encounter (Signed)
-----   Message from Loa Socks, LPN sent at 02/12/7288  9:30 PM EDT -----  ----- Message ----- From: Baird Lyons, RN Sent: 12/13/2020   5:35 PM EDT To: Loa Socks, LPN   ----- Message ----- From: Meriam Sprague, MD Sent: 12/13/2020   5:03 PM EDT To: [facility] Triage  Can we get her in for a sleep study for suspected OSA and start her on metop 25mg  XL daily? If her blood pressures look okay on the metop, we will add entresto next? Is it possible to get her in with the PharmD for medication titration?  Thank you so, so much!  - 

## 2020-12-18 NOTE — Telephone Encounter (Signed)
Reached out to the patient and she states she will do the itamar test and she will look for [facility] after her appointment on the 18th with the pharmacist.

## 2020-12-19 ENCOUNTER — Ambulatory Visit (HOSPITAL_COMMUNITY): Payer: 59

## 2020-12-23 ENCOUNTER — Other Ambulatory Visit: Payer: Self-pay

## 2020-12-23 ENCOUNTER — Ambulatory Visit (INDEPENDENT_AMBULATORY_CARE_PROVIDER_SITE_OTHER): Payer: 59 | Admitting: Pharmacist

## 2020-12-23 DIAGNOSIS — I10 Essential (primary) hypertension: Secondary | ICD-10-CM | POA: Diagnosis not present

## 2020-12-23 MED ORDER — ENTRESTO 24-26 MG PO TABS: 1.0000 | ORAL_TABLET | Freq: Two times a day (BID) | ORAL | 3 refills | Status: DC

## 2020-12-23 NOTE — Patient Instructions (Addendum)
STOP taking amlodipine 5mg  daily START taking Entresto 24/26mg  twice daily Continue metoprolol 25mg  daily  Please call me at [phone] with any questions  Follow up with Dr. on 8/10  Check out the Zoe podcast  TIPS for Living a healthier life  SUGAR  Sugar is a huge problem in the modern day diet. Sugar is a HUGE contributor to heart disease, diabetes, high triglyceride levels, fatty liver diease and obesity. Sugar is hidden in almost all packaged foods/beverages. Added sugar is extra sugar that is added beyond what is naturally found. It adds no nutritional benefit to your body and can cause major harm. The American Heart Association recommends limiting added sugars to no more than 25g for women and 36 grams for men per day.  There are many names for sugar maltose, sucrose (names ending in "ose"), high fructose corn syrup, molasses, cane sugar, corn sweetener, raw sugar, syrup, honey or fruit juice concentrate.   One of the best ways to limit your added sugars is to stop drinking sweetened beverages such as soda, sweet tea, fruit juice or fancy coffee's. There is 65g of added sugars in one 20oz bottle of Coke!! That is equal to 6 donuts.   Pay attention and read all nutrition facts labels. Below is an examples of a nutrition facts label. The #1 is showing you the total sugars where the # 2 is showing you the added sugars. This one severing has almost the max amount of added sugars per day!  Watch out for items that say "low fat" or "no added sugar" as these products are typically very high in sugar. The food industry uses these terms to fool you into thinking they are healthy.  For more information on the dangers of sugar watch WHY Sugar is as Bad as Alcohol (Fructose, The Liver Toxin) on YouTube.    EXERCISE  Exercise is good. We've all heard that. In an ideal world, we would all have time and resources to  get plenty of it. When you are active your heart pumps more  efficiently and you will feel better.  Multiple studies show that even walking regularly has benefits that include living a longer life.  The American Heart Association recommends 90-150 minutes per week of exercise (30 minutes  per day most days of the week). You can do this in any increment you wish. Nine or more  10-minute walks count. So does an hour-long exercise class. Break the time apart into what will  work in your life. Some of the best things you can do include walking briskly, jogging, cycling or  swimming laps. Not everyone is ready to "exercise." Sometimes we need to start with just getting active. Here  are some easy ways to be more active throughout the day:  Take the stairs instead of the elevator  Go for a 10-15 minute walk during your lunch break (find a friend to make it more enjoyable)  When shopping, park at the back of the parking lot  If you take public transportation, get off one stop early and walk the extra distance  Pace around while making phone calls (most of Shari Prows are not attached to phone cords any longer!) Check with your doctor if you aren't sure what your limitations may be. Always remember to drink plenty of water when doing any type of exercise. Don't feel like a failure if you're not getting the 90-150 minutes per week. If you started by being  a couch potato, then just  a 10-minute walk each day is a huge improvement. Start with little  victories and work your way up.   Healthy Eating Tips  When looking to improve your eating habits, whether to lose weight, lower blood pressure or just be healthier, it helps to know what a serving size is.   Grains 1 slice of bread,  bagel,  cup pasta or rice  Vegetables 1 cup fresh or raw vegetables,  cup cooked or canned Fruits 1 piece of medium sized fruit,  cup canned,   Meats/Proteins  cup dried       1 oz meat, 1 egg,  cup cooked beans, nuts or seeds  Dairy        Fats Individual yogurt container, 1  cup (8oz)    1 teaspoon margarine/butter or vegetable  milk or milk alternative, 1 slice of cheese          oil; 1 tablespoon mayonnaise or salad dressing                  Plan ahead: make a menu of the meals for a week then create a grocery list to go with  that menu. Consider meals that easily stretch into a night of leftovers, such as stews or  casseroles. Or consider making two of your favorite meal and put one in the freezer or fridge for  another night.  When you get home from the grocery store wash and prepare your vegetables and fruits.  Then when you need them they are ready to go.  Tips for going to the grocery store:  Buy store or generic brands  Check the weekly ad from your store on-line or in their in-store flyer  Look at the unit price on the shelf tag to compare/contrast the costs of different items  Buy fruits/vegetables in season  Carrots, bananas and apples are low-cost, naturally healthy items  If meats or frozen vegetables are on sale, buy some extras and put in your freezer  Limit buying prepared or "ready to eat" items, even if they are pre-made salads or fruit snacks  Do not shop when you're hungry  Foods at eye level tend to be more expensive. Look on the high and low shelves for deals.  Consider shopping at the farmer's market for fresh foods in season.  Choose canned tuna or salmon instead of fresh  Avoid the cookie and chip aisles (these are expensive, high in calories and low in  nutritional value). Shop on the outside of the grocery store.  Aim to have one 12 hour fast each day. This means no eating after dinner until breakfast. For example, if you eat dinner around 6 PM then you would not eat anything until 6 AM the next day. This is a great way to help lower your insulin levels, lose weight and reduce your blood pressure.   Healthy food preparations:  If you can't get lean hamburger, be sure to drain the fat when cooking  Steam, saut (in olive oil),  grill or bake foods  Experiment with different seasonings to avoid adding salt to your foods. Kosher salt, sea salt and Himalayan salt are all still salt and should be avoided Try seasoning food with onion, garlic, thyme, rosemary, basil ect. Onion powder or garlic powder is ok. Avoid if it says salt (ie garlic salt).        Resources: American Heart Association - MartiniMobile.it Go to the Healthy Living tab to get more information American Diabetes Association -  www.diabetes.org You don't have to be diabetic - check out the Food and Fitness tab

## 2020-12-23 NOTE — Progress Notes (Signed)
Patient ID: [name]                 DOB: [birth year]                      MRN: [id number]     HPI: Rebecca Gardner is a 25 y.o. female referred by Dr. Shari Gardner to HTN/CHF clinic. PMH is significant for depression, morbid obesity, and HTN. She was seen by Dr. Shari Gardner for palpitations. Echo showed mildly dilated LV and mildly reduced EF 45-50%. Patient cannot undergo MRI due to claustrophobia. She is to have a sleep study to assess for OSA. Long term monitor showed PACs. She was started on metoprolol succiante 25mg . Sent to PharmD clinic for help with titration and consideration of Entresto.   Patient presents today to PharmD clinic. She reports she decreased her amlodipine to 5mg  because she noticed leg swelling at higher doses. Swelling improved when she decreased back to 5mg . She denies dizziness, lightheadedness, headache, blurred vision, SOB or swelling. She is a respiratory therapist at [employer]. Lives in [place] and has a toddler. Not interested in having any more children at this moment. Is on the pill, but thinking about talking to OBGYN about IUD in August. Her husband sometimes uses a condom.   Home blood pressure is 125-135/70-80. HR 70's-102. Hasn't had an palpitations in a few weeks.   Current HTN meds: amlodipine 7.5mg  daily, metoprolol succinate 25mg  daily Previously tried:  BP goal: <130/80  Family History: The patient's family history includes Atrial fibrillation in her father; Breast cancer in her paternal grandmother; COPD in her maternal uncle; Cancer in her paternal grandfather; Healthy in her mother; Jaundice in her daughter; Migraines in her maternal uncle; Prostate cancer in her paternal grandfather.  Social History: never smoked, no etoh, no drug use  Diet: Stopped drinking soda, only 3 sprit zeros per week. No caffeine. Meal prepping, focusing on lower carb   Exercise: walks 2 miles on her off days  Home BP readings: HR mid 70's-102 125-135/70-80  Wt  Readings from Last 3 Encounters:  10/17/20 (!) 359 lb 6.4 oz (163 kg)  10/16/20 (!) 362 lb 12.8 oz (164.6 kg)  09/12/20 (!) 357 lb 14.4 oz (162.3 kg)   BP Readings from Last 3 Encounters:  10/17/20 134/79  10/16/20 138/78  09/12/20 134/80   Pulse Readings from Last 3 Encounters:  10/17/20 97  10/16/20 96  09/12/20 97    Renal function: CrCl cannot be calculated (Patient's most recent lab result is older than the maximum 21 days allowed.).  Past Medical History:  Diagnosis Date   Acute lateral meniscus tear of left knee 02/02/2014   Acute medial meniscus tear of left knee    Concussion with brief (less than one hour) loss of consciousness 4-28 15   Depression    Gall stones 03/27/2016   Hypertension    Nephrolithiasis     Current Outpatient Medications on File Prior to Visit  Medication Sig Dispense Refill   acetaminophen (TYLENOL) 325 MG tablet Take 650 mg by mouth every 6 (six) hours as needed for headache.     amLODipine (NORVASC) 5 MG tablet Take 1.5 tablets (7.5 mg total) by mouth daily. 135 tablet 1   buPROPion (WELLBUTRIN XL) 150 MG 24 hr tablet Take 1 tablet (150 mg total) by mouth daily. 90 tablet 4   clobetasol cream (TEMOVATE) 0.05 % Apply 1 application topically 2 (two) times daily. 60 g 2  FLUoxetine (PROZAC) 40 MG capsule Take 1 capsule (40 mg total) by mouth daily. 90 capsule 3   metoprolol succinate (TOPROL XL) 25 MG 24 hr tablet Take 1 tablet (25 mg total) by mouth daily. 90 tablet 3   norgestimate-ethinyl estradiol (SPRINTEC 28) 0.25-35 MG-MCG tablet Take 1 tablet by mouth daily. 90 tablet 4   No current facility-administered medications on file prior to visit.    No Known Allergies  not currently breastfeeding.   Assessment/Plan:  1. Hypertension/CHF - Due to patients mildly reduced EF and HTN, will start patient on Entresto 24/26mg  BID. Stop amlodipine. Continue metoprolol succinate 25mg  daily. Patient educated on the importance of birth control  while on . Will get BMP at visit with Dr. [place] on 8/10. Follow up via telephone with PharmD after that visit. Patient was given a copay card for [facility]. No PA needed. Continue checking BP at home.  2. Obesity-Diet discussed. Encouraged her to continue with diet and lifestyle changes as weight loss will be beneficial for her symptoms. Handouts given. Briefly discussed Wegovy/Saxenda but encouraged her to first try lifestyle modification.  Recommended the Zoe podcast.  Thank you  [facility], Pharm.D, BCPS, CPP  [facility]  [address] [place], [place]  Phone: [phone]; Fax: [phone]

## 2020-12-24 ENCOUNTER — Other Ambulatory Visit (HOSPITAL_COMMUNITY): Payer: Self-pay

## 2020-12-24 MED ORDER — ENTRESTO 24-26 MG PO TABS
1.0000 | ORAL_TABLET | Freq: Two times a day (BID) | ORAL | 3 refills | Status: DC
  Filled 2020-12-24: qty 180, 90d supply, fill #0

## 2020-12-26 ENCOUNTER — Telehealth (HOSPITAL_BASED_OUTPATIENT_CLINIC_OR_DEPARTMENT_OTHER): Payer: Self-pay | Admitting: Cardiology

## 2020-12-26 NOTE — Telephone Encounter (Signed)
Spoke with patient to discuss rescheduling the Cardiac MRI ordered by Dr.Pemberton---patient states she has spoken with Dr. Shari Prows and they have decided not for the patient to have a sleep study and place the Cardiac MRI on hold.

## 2020-12-30 ENCOUNTER — Encounter: Payer: Self-pay | Admitting: Cardiology

## 2021-01-15 ENCOUNTER — Ambulatory Visit: Payer: 59 | Admitting: Cardiology

## 2021-01-28 ENCOUNTER — Encounter: Payer: 59 | Admitting: Family

## 2021-01-30 ENCOUNTER — Encounter: Payer: 59 | Admitting: Family

## 2021-02-20 ENCOUNTER — Encounter: Payer: Self-pay | Admitting: Family

## 2021-02-20 ENCOUNTER — Other Ambulatory Visit: Payer: Self-pay

## 2021-02-20 ENCOUNTER — Other Ambulatory Visit (HOSPITAL_COMMUNITY)
Admission: RE | Admit: 2021-02-20 | Discharge: 2021-02-20 | Disposition: A | Discharge status: Home / Self Care | Payer: 59 | Source: Ambulatory Visit | Attending: Family | Admitting: Family

## 2021-02-20 ENCOUNTER — Ambulatory Visit (INDEPENDENT_AMBULATORY_CARE_PROVIDER_SITE_OTHER): Payer: 59 | Admitting: Family

## 2021-02-20 VITALS — BP 133/86 | HR 94 | Temp 97.7°F | Ht 68.0 in | Wt 364.4 lb

## 2021-02-20 DIAGNOSIS — Z01419 Encounter for gynecological examination (general) (routine) without abnormal findings: Secondary | ICD-10-CM | POA: Insufficient documentation

## 2021-02-20 DIAGNOSIS — Z01411 Encounter for gynecological examination (general) (routine) with abnormal findings: Secondary | ICD-10-CM | POA: Diagnosis not present

## 2021-02-20 DIAGNOSIS — Z1159 Encounter for screening for other viral diseases: Secondary | ICD-10-CM

## 2021-02-20 DIAGNOSIS — D72829 Elevated white blood cell count, unspecified: Secondary | ICD-10-CM

## 2021-02-20 DIAGNOSIS — Z23 Encounter for immunization: Secondary | ICD-10-CM

## 2021-02-20 NOTE — Patient Instructions (Signed)
Leukocytosis Leukocytosis means that a person has more white blood cells than normal. White blood cells are made in the bone marrow. Bone marrow is the spongy tissue inside bones. The main job of white blood cells is to fight infection. Having too many white blood cells is a common condition. It can develop as a result of many types of medical problems. What are the causes? Leukocytosis may be caused by various conditions. In some cases, the bone marrow is normal but is still making too many white blood cells. This can be due to: Infection. Injury. Physical stress. Emotional stress. Surgery. Allergic reactions. Tumors that do not start in the blood or bone marrow. An inherited disease. Certain medicines. Pregnancy and labor. In other cases, a person may have a bone marrow disorder that is causing the body to make too many white blood cells. Bone marrow disorders include: Leukemia. This is a type of blood cancer. Myeloproliferative disorders. These disorders cause blood cells to grow abnormally. What are the signs or symptoms? Often, this condition causes no symptoms. Some people may have symptoms due to the medical condition that is causing their leukocytosis. These symptoms may include: Bleeding. Bruising. Fever. Night sweats. Swollen lymph nodes. An enlarged spleen. Repeated infections. Weakness. Weight loss. How is this diagnosed? This condition is diagnosed with blood tests. It is often found when blood is tested as part of a routine physical exam. You may have other tests to help determine why you have too many white blood cells. These tests may include: A complete blood count (CBC). This test measures all the types of blood cells in your body. Chest X-rays, urine tests, or other tests to look for signs of infection. Bone marrow aspiration. For this test, a needle is put into your bone. Cells from the bone marrow are removed through the needle and examined under a  microscope. Other tests on the blood or bone marrow sample. CT scan, bone scan, or other imaging tests. How is this treated? Usually, treatment is not needed for leukocytosis. However, if an infection, cancer, bone marrow disorder, or other serious problem is causing your leukocytosis, it will need to be treated. Treatment may include: Regular monitoring of your white blood cell count to look for changes. Antibiotic medicine if you have a bacterial infection. Bone marrow transplant. This treatment replaces your diseased bone marrow with healthy cells that will grow new bone marrow. Chemotherapy or biological therapies such as the use of antibodies. These treatments may be used to kill cancer cells or to decrease the number of white blood cells. Follow these instructions at home: Medicines Take over-the-counter and prescription medicines only as told by your health care provider. If you were prescribed an antibiotic medicine, take it as told by your health care provider. Do not stop taking the antibiotic even if you start to feel better. Eating and drinking  Eat foods that are low in saturated fats and high in fiber. Eat plenty of fruits and vegetables. Drink enough fluid to keep your urine pale yellow. Limit your intake of caffeine and alcohol. General instructions Maintain a healthy weight. Ask your health care provider what weight is best for you. Do 30 minutes of exercise at least 5 times each week. Check with your health care provider before you start a new exercise routine. Follow any safety precautions as told by your health care provider. This may be needed if you are at increased risk for infection or bleeding because of your condition. Do not use  any products that contain nicotine or tobacco, such as cigarettes, e-cigarettes, and chewing tobacco. If you need help quitting, ask your health care provider. Keep all follow-up visits as told by your health care provider. This is  important. Contact a health care provider if you: Feel weak or more tired than usual. Develop chills, a cough, or nasal congestion. Have a fever. Lose weight without trying. Have night sweats. Bruise easily. Have new or worsening symptoms. Get help right away if you: Bleed more than normal. Have chest pain. Have trouble breathing. Have uncontrolled nausea or vomiting. Feel dizzy or light-headed. Summary Leukocytosis means that a person has more white blood cells than normal. This condition often causes no symptoms. This condition may be caused by various conditions. If an infection, cancer, bone marrow disorder, or other serious problem is causing your leukocytosis, it will need to be treated. Keep all follow-up visits as told by your health care provider. This is important. This information is not intended to replace advice given to you by your health care provider. Make sure you discuss any questions you have with your health care provider. Document Revised: 08/07/2020 Document Reviewed: 02/17/2018 Elsevier Patient Education  2022 [employer].

## 2021-02-20 NOTE — Progress Notes (Signed)
Subjective:    Patient ID: Rebecca Gardner, female    DOB: 19-May-1996, 25 y.o.   MRN: [id number]  Chief Complaint  Patient presents with   Gynecologic Exam   Pt presents to the office today for pap. Pt has had  leukocytosis over the last year since becoming pregnant. Will recheck today.  Gynecologic Exam The patient's pertinent negatives include no genital itching, vaginal bleeding or vaginal discharge. Pertinent negatives include no constipation, diarrhea, dysuria, frequency, hematuria or painful intercourse.     Review of Systems  Gastrointestinal:  Negative for constipation and diarrhea.  Genitourinary:  Negative for dysuria, frequency, hematuria and vaginal discharge.  All other systems reviewed and are negative.     Family History  Problem Relation Age of Onset   Breast cancer Paternal Grandmother    Healthy Mother    Atrial fibrillation Father    Prostate cancer Paternal Grandfather    Cancer Paternal Grandfather        liver   Migraines Maternal Uncle    COPD Maternal Uncle    Jaundice Daughter    Social History   Socioeconomic History   Marital status: Single    Spouse name: Not on file   Number of children: 0   Years of education: college   Highest education level: Associate degree: occupational, Scientist, product/process development, or vocational program  Occupational History   Occupation: Lawyer  Tobacco Use   Smoking status: Never   Smokeless tobacco: Never  Vaping Use   Vaping Use: Never used  Substance and Sexual Activity   Alcohol use: No   Drug use: No   Sexual activity: Yes    Birth control/protection: Condom, Injection  Other Topics Concern   Not on file  Social History Narrative   Not on file   Social Determinants of Health   Financial Resource Strain: Not on file  Food Insecurity: Not on file  Transportation Needs: Not on file  Physical Activity: Not on file  Stress: Not on file  Social Connections: Not on file    Objective:   Physical  Exam Vitals reviewed.  Constitutional:      General: She is not in acute distress.    Appearance: She is well-developed. She is obese.  HENT:     Head: Normocephalic and atraumatic.     Right Ear: Tympanic membrane normal.     Left Ear: Tympanic membrane normal.  Eyes:     Pupils: Pupils are equal, round, and reactive to light.  Neck:     Thyroid: No thyromegaly.  Cardiovascular:     Rate and Rhythm: Normal rate and regular rhythm.     Heart sounds: Normal heart sounds. No murmur heard. Pulmonary:     Effort: Pulmonary effort is normal. No respiratory distress.     Breath sounds: Normal breath sounds. No wheezing.  Abdominal:     General: Bowel sounds are normal. There is no distension.     Palpations: Abdomen is soft.     Tenderness: There is no abdominal tenderness.  Musculoskeletal:        General: No tenderness. Normal range of motion.     Cervical back: Normal range of motion and neck supple.  Skin:    General: Skin is warm and dry.  Neurological:     Mental Status: She is alert and oriented to person, place, and time.     Cranial Nerves: No cranial nerve deficit.     Deep Tendon Reflexes: Reflexes are normal and symmetric.  Psychiatric:        Behavior: Behavior normal.        Thought Content: Thought content normal.        Judgment: Judgment normal.      BP 133/86   Pulse 94   Temp 97.7 F (36.5 C) (Temporal)   Ht 5\' 8"  (1.727 m)   Wt (!) 364 lb 6.4 oz (165.3 kg)   BMI 55.41 kg/m      Assessment & Plan:  Nelles comes in today with chief complaint of Gynecologic Exam   Diagnosis and orders addressed:  1. Need for immunization against influenza - Flu Vaccine QUAD 45mo+IM (Fluarix, Fluzone & Alfiuria Quad PF) - CBC with Differential/Platelet  2. Encounter for gynecological examination without abnormal finding - Cytology - PAP([place]) - CBC with Differential/Platelet  3. Need for hepatitis C screening test - CBC with  Differential/Platelet - Hepatitis C antibody  4. Leukocytosis, unspecified type Labs pending  - CBC with Differential/Platelet  5. Morbid obesity (HCC)    Labs pending Health Maintenance reviewed Diet and exercise encouraged  Follow up plan: 6 months    5mo, FNP

## 2021-02-21 LAB — CBC WITH DIFFERENTIAL/PLATELET
Basophils Absolute: 0.1 10*3/uL (ref 0.0–0.2)
Basos: 0 %
EOS (ABSOLUTE): 0.3 10*3/uL (ref 0.0–0.4)
Eos: 2 %
Hematocrit: 38.2 % (ref 34.0–46.6)
Hemoglobin: 12.2 g/dL (ref 11.1–15.9)
Immature Grans (Abs): 0 10*3/uL (ref 0.0–0.1)
Immature Granulocytes: 0 %
Lymphocytes Absolute: 3 10*3/uL (ref 0.7–3.1)
Lymphs: 26 %
MCH: 25.7 pg — ABNORMAL LOW (ref 26.6–33.0)
MCHC: 31.9 g/dL (ref 31.5–35.7)
MCV: 80 fL (ref 79–97)
Monocytes Absolute: 0.6 10*3/uL (ref 0.1–0.9)
Monocytes: 5 %
Neutrophils Absolute: 7.8 10*3/uL — ABNORMAL HIGH (ref 1.4–7.0)
Neutrophils: 67 %
Platelets: 289 10*3/uL (ref 150–450)
RBC: 4.75 x10E6/uL (ref 3.77–5.28)
RDW: 13.5 % (ref 11.7–15.4)
WBC: 11.6 10*3/uL — ABNORMAL HIGH (ref 3.4–10.8)

## 2021-02-21 LAB — HEPATITIS C ANTIBODY: Hep C Virus Ab: <0.1 s/co ratio (ref 0.0–0.9)

## 2021-02-25 LAB — CYTOLOGY - PAP: Diagnosis: NEGATIVE

## 2021-04-15 ENCOUNTER — Other Ambulatory Visit: Payer: Self-pay

## 2021-04-15 ENCOUNTER — Ambulatory Visit: Payer: 59 | Admitting: Nurse Practitioner

## 2021-04-15 ENCOUNTER — Encounter: Payer: Self-pay | Admitting: Nurse Practitioner

## 2021-04-15 VITALS — BP 136/90 | HR 107 | Temp 98.1°F | Resp 20 | Ht 68.0 in | Wt 368.0 lb

## 2021-04-15 DIAGNOSIS — N926 Irregular menstruation, unspecified: Secondary | ICD-10-CM

## 2021-04-15 NOTE — Progress Notes (Signed)
Subjective:    Patient ID: Rebecca Gardner, female    DOB: [birth year], 25 y.o.   MRN: [id number]  Chief Complaint: Dysmenorrhea   HPI Patient states that she is a week late on her menses. She has always been real regular. Only time she has ever been late is when she was pregnant. She has done 2 home pregnancy tests and they were negative. She is under a lot of stress with work and home. Sh ehas had slight nausea with breast tenderness.    Review of Systems  Constitutional:  Negative for diaphoresis.  Eyes:  Negative for pain.  Respiratory:  Negative for shortness of breath.   Cardiovascular:  Negative for chest pain, palpitations and leg swelling.  Gastrointestinal:  Negative for abdominal pain.  Endocrine: Negative for polydipsia.  Skin:  Negative for rash.  Neurological:  Negative for dizziness, weakness and headaches.  Hematological:  Does not bruise/bleed easily.  All other systems reviewed and are negative.     Objective:   Physical Exam Vitals and nursing note reviewed.  Constitutional:      General: She is not in acute distress.    Appearance: Normal appearance. She is well-developed.  Neck:     Vascular: No carotid bruit or JVD.  Cardiovascular:     Rate and Rhythm: Normal rate and regular rhythm.     Heart sounds: Normal heart sounds.  Pulmonary:     Effort: Pulmonary effort is normal. No respiratory distress.     Breath sounds: Normal breath sounds. No wheezing or rales.  Chest:     Chest wall: No tenderness.  Abdominal:     General: Bowel sounds are normal. There is no distension or abdominal bruit.     Palpations: Abdomen is soft. There is no hepatomegaly, splenomegaly, mass or pulsatile mass.     Tenderness: There is no abdominal tenderness.  Musculoskeletal:        General: Normal range of motion.     Cervical back: Normal range of motion and neck supple.  Lymphadenopathy:     Cervical: No cervical adenopathy.  Skin:    General: Skin is warm and dry.   Neurological:     Mental Status: She is alert and oriented to person, place, and time.     Deep Tendon Reflexes: Reflexes are normal and symmetric.  Psychiatric:        Behavior: Behavior normal.        Thought Content: Thought content normal.        Judgment: Judgment normal.    BP 136/90   Pulse (!) 107   Temp 98.1 F (36.7 C)   Resp 20   Ht 5\' 8"  (1.727 m)   Wt (!) 368 lb (166.9 kg)   LMP 03/07/2021 (Approximate)   SpO2 97%   BMI 55.95 kg/m          Assessment & Plan:  Rebecca Gardner in today with chief complaint of Dysmenorrhea   1. Missed menses Will await on blood test results then will decide course of action. - Beta hCG quant (ref lab)    The above assessment and management plan was discussed with the patient. The patient verbalized understanding of and has agreed to the management plan. Patient is aware to call the clinic if symptoms persist or worsen. Patient is aware when to return to the clinic for a follow-up visit. Patient educated on when it is appropriate to go to the emergency department.   Mary-Margaret 03/09/2021, FNP

## 2021-04-16 LAB — BETA HCG QUANT (REF LAB): hCG Quant: <1 m[IU]/mL

## 2021-04-23 NOTE — Telephone Encounter (Signed)
Spoke with patient and we are going to wait and see if starts normal next month. If does not may do provera at that time.

## 2021-05-21 ENCOUNTER — Telehealth: Payer: Self-pay

## 2021-05-21 NOTE — Telephone Encounter (Signed)
No PA required per [facility] with [facility] on 05/21/21.   Called and made the patient aware that she may proceed with the [facility] Sleep Study. PIN # provided to the patient. Patient states that she no longer wants to take the test and will return it to the office within the next 2 weeks.

## 2021-05-27 NOTE — Telephone Encounter (Signed)
Patient contacted regarding [facility] Study. Informed patient that [facility] Sleep Study needs to be completed or returned within 2 weeks of today's date. Made patient aware that if the sleep study has not been done within this timeframe and the device has not been returned, that this will be handed over to billing per the signed waiver agreement. Patient verbalized understanding and thanked me for the call.   Followed up with the pt in regard to returning the [facility] study. Pt states she is gong to drop it off this week. Informed the pt she can let the front desk know that she is dropping off the home sleep study to [facility]. Pt thanked me for the call.

## 2021-06-16 ENCOUNTER — Other Ambulatory Visit: Payer: Self-pay

## 2021-06-16 NOTE — Telephone Encounter (Addendum)
Mychart, Generic Provider   Conversation: [facility] Sleep Study (Newest Message First) June 11, 2021 Me to [name]      4:30 PM   Dear Lorayne Marek,   This letter is to inform you that your [facility] Sleep Study needs to be completed or returned within 2 weeks of today's date. If the sleep study has not been done within this timeframe or the device has not been returned, then this will be handed over to our billing department per the signed waiver agreement.    If you have any questions or concerns, feel free to call us at [phone].    Sincerely,   [facility]  Last read by [name] at  6:44 PM on 06/11/2021.

## 2021-06-26 NOTE — Telephone Encounter (Signed)
Pt returned device today 06/26/21. I will un-register the device through [employer]. I will put device back into inventory. I have sent an email to Marcie Bal with [employer]. I was unable to un-register the device. Will see if Molly Maduro may be able to un-register the device so that we may be able put device back into inventory.

## 2021-07-22 ENCOUNTER — Other Ambulatory Visit: Payer: Self-pay | Admitting: Family

## 2021-07-22 DIAGNOSIS — Z30011 Encounter for initial prescription of contraceptive pills: Secondary | ICD-10-CM

## 2021-09-01 ENCOUNTER — Telehealth: Payer: Self-pay | Admitting: Family

## 2021-09-01 NOTE — Telephone Encounter (Signed)
Spoke with patient and she states that she has 1 child and it was delivered vaginally. Advised patient to call the office the first day of her next period and we would bring her in on day 4-5. Patient verbalized understanding ?

## 2022-02-03 ENCOUNTER — Telehealth: Payer: 59 | Admitting: Family

## 2022-02-03 ENCOUNTER — Encounter: Payer: Self-pay | Admitting: Family

## 2022-02-03 DIAGNOSIS — Z20822 Contact with and (suspected) exposure to covid-19: Secondary | ICD-10-CM | POA: Diagnosis not present

## 2022-02-03 DIAGNOSIS — Z6841 Body Mass Index (BMI) 40.0 and over, adult: Secondary | ICD-10-CM

## 2022-02-03 MED ORDER — MOLNUPIRAVIR EUA 200MG CAPSULE: 4.0000 | ORAL_CAPSULE | Freq: Two times a day (BID) | ORAL | 0 refills | Status: AC

## 2022-02-03 MED ORDER — BENZONATATE 200 MG PO CAPS: 200.0000 mg | ORAL_CAPSULE | Freq: Three times a day (TID) | ORAL | 1 refills | Status: DC | PRN

## 2022-02-03 NOTE — Progress Notes (Signed)
Virtual Visit Consent   [name], you are scheduled for a virtual visit with a [facility] provider today. Just as with appointments in the office, your consent must be obtained to participate. Your consent will be active for this visit and any virtual visit you may have with one of our providers in the next 365 days. If you have a MyChart account, a copy of this consent can be sent to you electronically.  As this is a virtual visit, video technology does not allow for your provider to perform a traditional examination. This may limit your provider's ability to fully assess your condition. If your provider identifies any concerns that need to be evaluated in person or the need to arrange testing (such as labs, EKG, etc.), we will make arrangements to do so. Although advances in technology are sophisticated, we cannot ensure that it will always work on either your end or our end. If the connection with a video visit is poor, the visit may have to be switched to a telephone visit. With either a video or telephone visit, we are not always able to ensure that we have a secure connection.  By engaging in this virtual visit, you consent to the provision of healthcare and authorize for your insurance to be billed (if applicable) for the services provided during this visit. Depending on your insurance coverage, you may receive a charge related to this service.  I need to obtain your verbal consent now. Are you willing to proceed with your visit today? Rebecca Gardner has provided verbal consent on 02/03/2022 for a virtual visit (video or telephone). Jannifer Rodney, FNP  Date: 02/03/2022 10:23 AM  Virtual Visit via Video Note   I, Jannifer Rodney, connected with  Rebecca Gardner  ([id number], 1996-06-05) on 02/03/22 at 10:10 AM EDT by a video-enabled telemedicine application and verified that I am speaking with the correct person using two identifiers.  Location: Patient: Virtual Visit Location Patient:  Home Provider: Virtual Visit Location Provider: Office/Clinic   I discussed the limitations of evaluation and management by telemedicine and the availability of in person appointments. The patient expressed understanding and agreed to proceed.    History of Present Illness: Rebecca Gardner is a 26 y.o. who identifies as a female who was assigned female at birth, and is being seen today for COVID like symptoms. States her sister-in-law tested positive over the weekend.   States yesterday woke up with headache, body aches, chills, cough, and congestion.   HPI: URI  This is a new problem. The current episode started in the past 7 days. Associated symptoms include congestion, coughing, headaches, joint pain, rhinorrhea, sinus pain and a sore throat. Pertinent negatives include no diarrhea, ear pain, nausea or sneezing. She has tried acetaminophen for the symptoms. The treatment provided mild relief.    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   Cephalalgia 02/05/2016   Morbid obesity (HCC) 07/30/2015    Allergies: No Known Allergies Medications:  Current Outpatient Medications:    benzonatate (TESSALON) 200 MG capsule, Take 1 capsule (200 mg total) by mouth 3 (three) times daily as needed., Disp: 30 capsule, Rfl: 1   molnupiravir EUA (LAGEVRIO) 200 mg CAPS capsule,  Take 4 capsules (800 mg total) by mouth 2 (two) times daily for 5 days., Disp: 40 capsule, Rfl: 0   acetaminophen (TYLENOL) 325 MG tablet, Take 650 mg by mouth every 6 (six) hours as needed for headache., Disp: , Rfl:    buPROPion (WELLBUTRIN XL) 150 MG 24 hr tablet, Take 1 tablet (150 mg total) by mouth daily., Disp: 90 tablet,  Rfl: 4   clobetasol cream (TEMOVATE) 0.05 %, Apply 1 application topically 2 (two) times daily., Disp: 60 g, Rfl: 2   FLUoxetine (PROZAC) 40 MG capsule, Take 1 capsule (40 mg total) by mouth daily., Disp: 90 capsule, Rfl: 3   metoprolol succinate (TOPROL XL) 25 MG 24 hr tablet, Take 1 tablet (25 mg total) by mouth daily., Disp: 90 tablet, Rfl: 3   norgestimate-ethinyl estradiol (ORTHO-CYCLEN) 0.25-35 MG-MCG tablet, TAKE 1 TABLET BY MOUTH EVERY DAY, Disp: 28 tablet, Rfl: 6  Observations/Objective: Patient is well-developed, well-nourished in no acute distress.  Resting comfortably  at home.  Head is normocephalic, atraumatic.  No labored breathing. Speech is clear and coherent with logical content.  Patient is alert and oriented at baseline.  Nasal congestion  Assessment and Plan: 1. Exposure to COVID-19 virus - Novel Coronavirus, NAA ([facility]) - molnupiravir EUA (LAGEVRIO) 200 mg CAPS capsule; Take 4 capsules (800 mg total) by mouth 2 (two) times daily for 5 days.  Dispense: 40 capsule; Refill: 0 - benzonatate (TESSALON) 200 MG capsule; Take 1 capsule (200 mg total) by mouth 3 (three) times daily as needed.  Dispense: 30 capsule; Refill: 1  2. Encounter by telehealth for suspected COVID-19 - Novel Coronavirus, NAA ([facility]) - molnupiravir EUA (LAGEVRIO) 200 mg CAPS capsule; Take 4 capsules (800 mg total) by mouth 2 (two) times daily for 5 days.  Dispense: 40 capsule; Refill: 0 - benzonatate (TESSALON) 200 MG capsule; Take 1 capsule (200 mg total) by mouth 3 (three) times daily as needed.  Dispense: 30 capsule; Refill: 1  3. Morbid obesity (HCC)  COVID positive, rest, force fluids, tylenol as needed, Quarantine for at least 5 days and you are fever free, then must wear a mask out in public from day 6-10, report any worsening symptoms such as increased shortness of breath, swelling, or continued high fevers. Possible adverse effects discussed with antivirals.    Follow Up  Instructions: I discussed the assessment and treatment plan with the patient. The patient was provided an opportunity to ask questions and all were answered. The patient agreed with the plan and demonstrated an understanding of the instructions.  A copy of instructions were sent to the patient via MyChart unless otherwise noted below.     The patient was advised to call back or seek an in-person evaluation if the symptoms worsen or if the condition fails to improve as anticipated.  Time:  I spent 10 minutes with the patient via telehealth technology discussing the above problems/concerns.    Jannifer Rodney, FNP

## 2022-02-04 LAB — NOVEL CORONAVIRUS, NAA: SARS-CoV-2, NAA: DETECTED — AB

## 2022-02-18 ENCOUNTER — Other Ambulatory Visit: Payer: Self-pay | Admitting: Family

## 2022-02-18 DIAGNOSIS — Z30011 Encounter for initial prescription of contraceptive pills: Secondary | ICD-10-CM

## 2022-02-19 ENCOUNTER — Telehealth: Payer: Self-pay | Admitting: Family

## 2022-02-19 NOTE — Telephone Encounter (Signed)
PT SCHEDULED

## 2022-02-19 NOTE — Telephone Encounter (Signed)
No appointments can two same days be put together or can patient be scheduled on DOD?

## 2022-02-19 NOTE — Telephone Encounter (Signed)
Patient can be worked in whenever.

## 2022-03-05 ENCOUNTER — Encounter: Payer: Self-pay | Admitting: Family

## 2022-03-05 ENCOUNTER — Ambulatory Visit (INDEPENDENT_AMBULATORY_CARE_PROVIDER_SITE_OTHER): Payer: 59 | Admitting: Family

## 2022-03-05 VITALS — BP 131/88 | HR 98 | Temp 98.0°F | Ht 68.0 in | Wt 354.8 lb

## 2022-03-05 DIAGNOSIS — F320 Major depressive disorder, single episode, mild: Secondary | ICD-10-CM | POA: Diagnosis not present

## 2022-03-05 DIAGNOSIS — Z0000 Encounter for general adult medical examination without abnormal findings: Secondary | ICD-10-CM

## 2022-03-05 DIAGNOSIS — F411 Generalized anxiety disorder: Secondary | ICD-10-CM | POA: Diagnosis not present

## 2022-03-05 DIAGNOSIS — Z0001 Encounter for general adult medical examination with abnormal findings: Secondary | ICD-10-CM | POA: Diagnosis not present

## 2022-03-05 DIAGNOSIS — K219 Gastro-esophageal reflux disease without esophagitis: Secondary | ICD-10-CM

## 2022-03-05 DIAGNOSIS — Z713 Dietary counseling and surveillance: Secondary | ICD-10-CM

## 2022-03-05 MED ORDER — SEMAGLUTIDE-WEIGHT MANAGEMENT 0.5 MG/0.5ML ~~LOC~~ SOAJ
0.5000 mg | SUBCUTANEOUS | 0 refills | Status: AC
  Filled 2022-03-05 – 2022-07-04 (×2): qty 2, 28d supply, fill #0

## 2022-03-05 MED ORDER — SEMAGLUTIDE-WEIGHT MANAGEMENT 1 MG/0.5ML ~~LOC~~ SOAJ
1.0000 mg | SUBCUTANEOUS | 0 refills | Status: DC
  Filled 2022-03-05: qty 2, 28d supply, fill #0

## 2022-03-05 MED ORDER — OMEPRAZOLE 20 MG PO CPDR: 20.0000 mg | DELAYED_RELEASE_CAPSULE | Freq: Every day | ORAL | 3 refills | Status: DC

## 2022-03-05 MED ORDER — ONDANSETRON HCL 4 MG PO TABS
4.0000 mg | ORAL_TABLET | Freq: Three times a day (TID) | ORAL | 0 refills | Status: DC | PRN
  Filled 2022-03-05: qty 20, 7d supply, fill #0

## 2022-03-05 MED ORDER — SEMAGLUTIDE-WEIGHT MANAGEMENT 0.25 MG/0.5ML ~~LOC~~ SOAJ
0.2500 mg | SUBCUTANEOUS | 0 refills | Status: DC
  Filled 2022-03-05 – 2022-06-22 (×3): qty 2, 28d supply, fill #0

## 2022-03-05 NOTE — Progress Notes (Signed)
Subjective:    Patient ID: [name], female    DOB: December 23, 1995, 26 y.o.   MRN: [id number]  Chief Complaint  Patient presents with   Annual Exam     Pt presents to the office today for CPE without pap. Pap was completed on 02/20/21 and was negative.   She is morbid obese with a BMI of 53. Reports she has tried dieting and exercising without relief.  Anxiety Presents for follow-up visit. Symptoms include depressed mood, excessive worry, irritability, nervous/anxious behavior and restlessness. Symptoms occur occasionally. The severity of symptoms is mild.    Depression        This is a chronic problem.  The current episode started more than 1 year ago.   The onset quality is gradual.   The problem occurs intermittently.  Associated symptoms include fatigue, restlessness and appetite change.  Associated symptoms include no helplessness, no hopelessness and not sad.  Past treatments include nothing.  Past medical history includes anxiety.   Gastroesophageal Reflux She complains of belching and heartburn. This is a chronic problem. The current episode started more than 1 year ago. The problem occurs occasionally. Associated symptoms include fatigue. She has tried an antacid for the symptoms. The treatment provided mild relief.      Review of Systems  Constitutional:  Positive for appetite change, fatigue and irritability.  Gastrointestinal:  Positive for heartburn.  Psychiatric/Behavioral:  Positive for depression. The patient is nervous/anxious.   All other systems reviewed and are negative.  Family History  Problem Relation Age of Onset   Breast cancer Paternal 18    Healthy Mother    Atrial fibrillation Father    Prostate cancer Paternal Grandfather    Cancer Paternal Grandfather        liver   Migraines Maternal Uncle    COPD Maternal Uncle    Jaundice Daughter    Social History   Socioeconomic History   Marital status: Single    Spouse name: Not on file    Number of children: 0   Years of education: college   Highest education level: Associate degree: occupational, Hotel manager, or vocational program  Occupational History   Occupation: Oceanographer  Tobacco Use   Smoking status: Never   Smokeless tobacco: Never  Vaping Use   Vaping Use: Never used  Substance and Sexual Activity   Alcohol use: No   Drug use: No   Sexual activity: Yes    Birth control/protection: Condom, Injection  Other Topics Concern   Not on file  Social History Narrative   Not on file   Social Determinants of Health   Financial Resource Strain: Low Risk  (02/20/2019)   Overall Financial Resource Strain (CARDIA)    Difficulty of Paying Living Expenses: Not hard at all  Food Insecurity: No Food Insecurity (02/20/2019)   Hunger Vital Sign    Worried About Running Out of Food in the Last Year: Never true    [place] in the Last Year: Never true  Transportation Needs: No Transportation Needs (02/20/2019)   PRAPARE - Hydrologist (Medical): No    Lack of Transportation (Non-Medical): No  Physical Activity: Insufficiently Active (02/20/2019)   Exercise Vital Sign    Days of Exercise per Week: 4 days    Minutes of Exercise per Session: 30 min  Stress: No Stress Concern Present (02/20/2019)   [place]    Feeling of  Stress : Not at all  Social Connections: Moderately Integrated (02/20/2019)   Social Connection and Isolation Panel [NHANES]    Frequency of Communication with Friends and Family: Three times a week    Frequency of Social Gatherings with Friends and Family: Three times a week    Attends Religious Services: 1 to 4 times per year    Active Member of Clubs or Organizations: No    Attends Archivist Meetings: Never    Marital Status: Living with partner       Objective:   Physical Exam Vitals reviewed.  Constitutional:      General: She  is not in acute distress.    Appearance: She is well-developed. She is obese.  HENT:     Head: Normocephalic and atraumatic.     Right Ear: Tympanic membrane normal.     Left Ear: Tympanic membrane normal.  Eyes:     Pupils: Pupils are equal, round, and reactive to light.  Neck:     Thyroid: No thyromegaly.  Cardiovascular:     Rate and Rhythm: Normal rate and regular rhythm.     Heart sounds: Normal heart sounds. No murmur heard. Pulmonary:     Effort: Pulmonary effort is normal. No respiratory distress.     Breath sounds: Normal breath sounds. No wheezing.  Abdominal:     General: Bowel sounds are normal. There is no distension.     Palpations: Abdomen is soft.     Tenderness: There is no abdominal tenderness.  Musculoskeletal:        General: No tenderness. Normal range of motion.     Cervical back: Normal range of motion and neck supple.  Skin:    General: Skin is warm and dry.  Neurological:     Mental Status: She is alert and oriented to person, place, and time.     Cranial Nerves: No cranial nerve deficit.     Deep Tendon Reflexes: Reflexes are normal and symmetric.  Psychiatric:        Behavior: Behavior normal.        Thought Content: Thought content normal.        Judgment: Judgment normal.     BP 131/88   Pulse 98   Temp 98 F (36.7 C) (Temporal)   Ht _0  (1.727 m)   Wt (!) 354 lb 12.8 oz (160.9 kg)   SpO2 96%   BMI 53.95 kg/m        Assessment & Plan:  Kathalene Frames Morikawa comes in today with chief complaint of Annual Exam   Diagnosis and orders addressed:  1. Annual physical exam - CMP14+EGFR - CBC with Differential/Platelet - Lipid panel - TSH  2. Gastroesophageal reflux disease without esophagitis - omeprazole (PRILOSEC) 20 MG capsule; Take 1 capsule (20 mg total) by mouth daily.  Dispense: 90 capsule; Refill: 3 - CMP14+EGFR - CBC with Differential/Platelet  3. Morbid obesity ([place]) - CMP14+EGFR - CBC with Differential/Platelet -  Semaglutide-Weight Management 0.25 MG/0.5ML SOAJ; Inject 0.25 mg into the skin once a week for 28 days.  Dispense: 2 mL; Refill: 0 - Semaglutide-Weight Management 0.5 MG/0.5ML SOAJ; Inject 0.5 mg into the skin once a week for 28 days.  Dispense: 2 mL; Refill: 0 - Semaglutide-Weight Management 1 MG/0.5ML SOAJ; Inject 1 mg into the skin once a week for 28 days.  Dispense: 2 mL; Refill: 0  4. GAD (generalized anxiety disorder) - CMP14+EGFR - CBC with Differential/Platelet  5. Depression, major, single episode, mild (  [place]) - CMP14+EGFR - CBC with Differential/Platelet  6. Weight loss counseling, encounter for Will start Ozempic 0.25 mg for one month then increase to 0.5 mg for one month, then 1 mg RTO in 1 month  Encourage healthy diet and exercise  - Semaglutide-Weight Management 0.25 MG/0.5ML SOAJ; Inject 0.25 mg into the skin once a week for 28 days.  Dispense: 2 mL; Refill: 0 - Semaglutide-Weight Management 0.5 MG/0.5ML SOAJ; Inject 0.5 mg into the skin once a week for 28 days.  Dispense: 2 mL; Refill: 0 - Semaglutide-Weight Management 1 MG/0.5ML SOAJ; Inject 1 mg into the skin once a week for 28 days.  Dispense: 2 mL; Refill: 0   Labs pending Health Maintenance reviewed Diet and exercise encouraged  Follow up plan: 3 months for weight loss    Evelina Dun, FNP

## 2022-03-05 NOTE — Patient Instructions (Addendum)

## 2022-03-06 ENCOUNTER — Other Ambulatory Visit (HOSPITAL_COMMUNITY): Payer: Self-pay

## 2022-03-06 LAB — CMP14+EGFR
ALT: 13 IU/L (ref 0–32)
AST: 10 IU/L (ref 0–40)
Albumin/Globulin Ratio: 1.4 (ref 1.2–2.2)
Albumin: 4.1 g/dL (ref 4.0–5.0)
Alkaline Phosphatase: 60 IU/L (ref 44–121)
BUN/Creatinine Ratio: 12 (ref 9–23)
BUN: 8 mg/dL (ref 6–20)
Bilirubin Total: <0.2 mg/dL (ref 0.0–1.2)
CO2: 21 mmol/L (ref 20–29)
Calcium: 9.2 mg/dL (ref 8.7–10.2)
Chloride: 101 mmol/L (ref 96–106)
Creatinine, Ser: 0.69 mg/dL (ref 0.57–1.00)
Globulin, Total: 3 g/dL (ref 1.5–4.5)
Glucose: 79 mg/dL (ref 70–99)
Potassium: 4.5 mmol/L (ref 3.5–5.2)
Sodium: 137 mmol/L (ref 134–144)
Total Protein: 7.1 g/dL (ref 6.0–8.5)
eGFR: 123 mL/min/{1.73_m2} (ref >59)

## 2022-03-06 LAB — CBC WITH DIFFERENTIAL/PLATELET
Basophils Absolute: 0 10*3/uL (ref 0.0–0.2)
Basos: 0 %
EOS (ABSOLUTE): 0.2 10*3/uL (ref 0.0–0.4)
Eos: 2 %
Hematocrit: 37.9 % (ref 34.0–46.6)
Hemoglobin: 12.3 g/dL (ref 11.1–15.9)
Immature Grans (Abs): 0.1 10*3/uL (ref 0.0–0.1)
Immature Granulocytes: 1 %
Lymphocytes Absolute: 3.6 10*3/uL — ABNORMAL HIGH (ref 0.7–3.1)
Lymphs: 30 %
MCH: 26.1 pg — ABNORMAL LOW (ref 26.6–33.0)
MCHC: 32.5 g/dL (ref 31.5–35.7)
MCV: 81 fL (ref 79–97)
Monocytes Absolute: 0.7 10*3/uL (ref 0.1–0.9)
Monocytes: 6 %
Neutrophils Absolute: 7.5 10*3/uL — ABNORMAL HIGH (ref 1.4–7.0)
Neutrophils: 61 %
Platelets: 340 10*3/uL (ref 150–450)
RBC: 4.71 x10E6/uL (ref 3.77–5.28)
RDW: 13.7 % (ref 11.7–15.4)
WBC: 12.1 10*3/uL — ABNORMAL HIGH (ref 3.4–10.8)

## 2022-03-06 LAB — LIPID PANEL
Chol/HDL Ratio: 4 ratio (ref 0.0–4.4)
Cholesterol, Total: 191 mg/dL (ref 100–199)
HDL: 48 mg/dL (ref >39)
LDL Chol Calc (NIH): 108 mg/dL — ABNORMAL HIGH (ref 0–99)
Triglycerides: 203 mg/dL — ABNORMAL HIGH (ref 0–149)
VLDL Cholesterol Cal: 35 mg/dL (ref 5–40)

## 2022-03-06 LAB — TSH: TSH: 3.8 u[IU]/mL (ref 0.450–4.500)

## 2022-03-07 ENCOUNTER — Other Ambulatory Visit (HOSPITAL_COMMUNITY): Payer: Self-pay

## 2022-03-09 ENCOUNTER — Telehealth: Payer: Self-pay

## 2022-03-09 NOTE — Telephone Encounter (Signed)
PA has been submitted - pending results   Rebecca Gardner (Key: [id number]) Rx #: [id number] HUTMLY 0.25MG /0.5ML auto-injectors

## 2022-03-09 NOTE — Telephone Encounter (Signed)
Alvira Hecht (Key: [id number]) Rx #: [id number] BHALPF 0.5MG /0.5ML auto-injectors   Form [employer] ePA Form 2017 NCPDP Created 3 days ago Sent to Plan 3 minutes ago Plan Response 3 minutes ago Submit Clinical Questions Determination Message from Plan A prior authorization request for this medication is currently in-progress with [employer]. If you have any questions about this request, please contact [phone].

## 2022-03-10 ENCOUNTER — Other Ambulatory Visit (HOSPITAL_COMMUNITY): Payer: Self-pay

## 2022-03-10 NOTE — Telephone Encounter (Signed)
The authorization is effective from 03/10/2022 to 09/29/2022, as long as the member is enrolled in their current health plan.  This request has been approved for 3mL per 28 days. Additional prior authorizations (PA) have been entered XYB:FXOVAN 0.5mg /0.40mL with a quantity limit of 4 pens (23mL) per 28 days (PA 20154)Wegovy 1mg /0.41mL with a quantity limit of 4 pens (74mL) per 28 days (PA 20155)Wegovy 1.7mg /0.74mL with a quantity limit of 4 pens (67mL) per 28 days (PA 20156)Wegovy 2.4mg /0.74mL with a quantity limit of 4 pens (22mL) per 28 days (PA 20157)effective 03/10/2022 through 09/29/2022. A written notification letter will follow with additional details.  Pharmacy notified

## 2022-03-11 ENCOUNTER — Other Ambulatory Visit (HOSPITAL_COMMUNITY): Payer: Self-pay

## 2022-03-12 ENCOUNTER — Other Ambulatory Visit (HOSPITAL_COMMUNITY): Payer: Self-pay

## 2022-03-17 ENCOUNTER — Other Ambulatory Visit (HOSPITAL_COMMUNITY): Payer: Self-pay

## 2022-03-18 ENCOUNTER — Other Ambulatory Visit (HOSPITAL_COMMUNITY): Payer: Self-pay

## 2022-03-25 ENCOUNTER — Other Ambulatory Visit (HOSPITAL_COMMUNITY): Payer: Self-pay

## 2022-04-08 ENCOUNTER — Telehealth: Payer: Self-pay | Admitting: Internal Medicine

## 2022-04-08 MED ORDER — PREDNISONE 20 MG PO TABS: 40.0000 mg | ORAL_TABLET | Freq: Every day | ORAL | 0 refills | Status: AC

## 2022-04-08 MED ORDER — AMOXICILLIN-POT CLAVULANATE 875-125 MG PO TABS: 1.0000 | ORAL_TABLET | Freq: Two times a day (BID) | ORAL | 0 refills | Status: AC

## 2022-04-08 NOTE — Telephone Encounter (Signed)
Worsening bronchitis, sinus symptoms x 1 week.  Has responded to steroids/abx in past.  Will give course.  F/u with PCP vs. Pulm depending on symptoms.   Erskine Emery MD PCCM

## 2022-04-13 ENCOUNTER — Emergency Department (HOSPITAL_COMMUNITY): Payer: 59

## 2022-04-13 ENCOUNTER — Other Ambulatory Visit: Payer: Self-pay

## 2022-04-13 ENCOUNTER — Emergency Department (HOSPITAL_COMMUNITY)
Admission: EM | Admit: 2022-04-13 | Discharge: 2022-04-13 | Disposition: A | Discharge status: Home / Self Care | Payer: 59 | Attending: Emergency Medicine | Admitting: Emergency Medicine

## 2022-04-13 DIAGNOSIS — I10 Essential (primary) hypertension: Secondary | ICD-10-CM | POA: Insufficient documentation

## 2022-04-13 DIAGNOSIS — Z79899 Other long term (current) drug therapy: Secondary | ICD-10-CM | POA: Diagnosis not present

## 2022-04-13 DIAGNOSIS — R519 Headache, unspecified: Secondary | ICD-10-CM | POA: Diagnosis not present

## 2022-04-13 DIAGNOSIS — R110 Nausea: Secondary | ICD-10-CM | POA: Diagnosis not present

## 2022-04-13 LAB — CBC WITH DIFFERENTIAL/PLATELET
Abs Immature Granulocytes: 0.02 10*3/uL (ref 0.00–0.07)
Basophils Absolute: 0 10*3/uL (ref 0.0–0.1)
Basophils Relative: 0 %
Eosinophils Absolute: 0.2 10*3/uL (ref 0.0–0.5)
Eosinophils Relative: 2 %
HCT: 39.9 % (ref 36.0–46.0)
Hemoglobin: 12.8 g/dL (ref 12.0–15.0)
Immature Granulocytes: 0 %
Lymphocytes Relative: 28 %
Lymphs Abs: 3.1 10*3/uL (ref 0.7–4.0)
MCH: 26.3 pg (ref 26.0–34.0)
MCHC: 32.1 g/dL (ref 30.0–36.0)
MCV: 82.1 fL (ref 80.0–100.0)
Monocytes Absolute: 0.5 10*3/uL (ref 0.1–1.0)
Monocytes Relative: 4 %
Neutro Abs: 7.2 10*3/uL (ref 1.7–7.7)
Neutrophils Relative %: 66 %
Platelets: 329 10*3/uL (ref 150–400)
RBC: 4.86 MIL/uL (ref 3.87–5.11)
RDW: 13.4 % (ref 11.5–15.5)
WBC: 11 10*3/uL — ABNORMAL HIGH (ref 4.0–10.5)
nRBC: 0 % (ref 0.0–0.2)

## 2022-04-13 LAB — I-STAT BETA HCG BLOOD, ED (MC, WL, AP ONLY): I-stat hCG, quantitative: <5 m[IU]/mL (ref <5)

## 2022-04-13 LAB — COMPREHENSIVE METABOLIC PANEL
ALT: 19 U/L (ref 0–44)
AST: 16 U/L (ref 15–41)
Albumin: 3.4 g/dL — ABNORMAL LOW (ref 3.5–5.0)
Alkaline Phosphatase: 49 U/L (ref 38–126)
Anion gap: 7 (ref 5–15)
BUN: 11 mg/dL (ref 6–20)
CO2: 23 mmol/L (ref 22–32)
Calcium: 8.8 mg/dL — ABNORMAL LOW (ref 8.9–10.3)
Chloride: 108 mmol/L (ref 98–111)
Creatinine, Ser: 0.69 mg/dL (ref 0.44–1.00)
GFR, Estimated: >60 mL/min (ref >60)
Glucose, Bld: 91 mg/dL (ref 70–99)
Potassium: 3.8 mmol/L (ref 3.5–5.1)
Sodium: 138 mmol/L (ref 135–145)
Total Bilirubin: 0.2 mg/dL — ABNORMAL LOW (ref 0.3–1.2)
Total Protein: 7.6 g/dL (ref 6.5–8.1)

## 2022-04-13 MED ORDER — DIPHENHYDRAMINE HCL 50 MG/ML IJ SOLN
12.5000 mg | Freq: Once | INTRAMUSCULAR | Status: AC
  Administered 2022-04-13: 12.5 mg via INTRAVENOUS
  Filled 2022-04-13: qty 1

## 2022-04-13 MED ORDER — LACTATED RINGERS IV SOLN: INTRAVENOUS | Status: DC

## 2022-04-13 MED ORDER — METOCLOPRAMIDE HCL 5 MG/ML IJ SOLN
5.0000 mg | Freq: Once | INTRAMUSCULAR | Status: AC
  Administered 2022-04-13: 5 mg via INTRAVENOUS
  Filled 2022-04-13: qty 2

## 2022-04-13 MED ORDER — KETOROLAC TROMETHAMINE 30 MG/ML IJ SOLN
15.0000 mg | Freq: Once | INTRAMUSCULAR | Status: AC
  Administered 2022-04-13: 15 mg via INTRAVENOUS
  Filled 2022-04-13: qty 1

## 2022-04-13 NOTE — ED Triage Notes (Signed)
Patient c/o HTN x2 days. States she does take any medication for HTN. She c/o headache, nausea and floaters.

## 2022-04-13 NOTE — ED Provider Notes (Signed)
[facility]-EMERGENCY DEPT Provider Note   CSN: [id number] Arrival date & time: 04/13/22  0907     History  Chief Complaint  Patient presents with   Hypertension    Rebecca Gardner is a 26 y.o. female.  26 year old female presents with sudden onset left-sided headache which began yesterday.  Continues on to today.  Nausea but no vomiting.  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  Vision has returned back to normal.  She does notice some redness to her eye.  Had a rather mote history of hypertension during her last pregnancy and has not been on medications for several years.  Denies any chest pain or shortness of breath.  Denies any ataxia.  No focal weakness of the extremities.  Has any facial numbness or tingling       Home Medications Prior to Admission medications   Medication Sig Start Date End Date Taking? Authorizing Provider  acetaminophen (TYLENOL) 325 MG tablet Take 650 mg by mouth every 6 (six) hours as needed for headache.    [provider]  amoxicillin-clavulanate (AUGMENTIN) 875-125 MG tablet Take 1 tablet by mouth 2 (two) times daily for 7 days. 04/08/22 04/15/22  Lorin Glass, MD  clobetasol cream (TEMOVATE) 0.05 % Apply 1 application topically 2 (two) times daily. 09/12/20   Junie Spencer, FNP  norgestimate-ethinyl estradiol (ORTHO-CYCLEN) 0.25-35 MG-MCG tablet TAKE 1 TABLET BY MOUTH EVERY DAY 02/18/22   Jannifer Rodney A, FNP  omeprazole (PRILOSEC) 20 MG capsule Take 1 capsule (20 mg total) by mouth daily. 03/05/22   Junie Spencer, FNP  ondansetron (ZOFRAN) 4 MG tablet Take 1 tablet (4 mg total) by mouth every 8 (eight) hours as needed for nausea or vomiting. 03/05/22   Jannifer Rodney A, FNP  predniSONE (DELTASONE) 20 MG tablet Take 2 tablets (40 mg total) by mouth daily with breakfast for 5 days. 04/08/22 04/13/22  Lorin Glass, MD  Semaglutide-Weight Management 0.25 MG/0.5ML SOAJ Inject 0.25 mg into the skin once a week for 28 days. 03/05/22 04/15/22  Junie Spencer, FNP  Semaglutide-Weight Management 0.5 MG/0.5ML SOAJ Inject 0.5 mg into the skin once a week for 28 days. 04/03/22 05/01/22  Junie Spencer, FNP  Semaglutide-Weight Management 1 MG/0.5ML SOAJ Inject 1 mg into the skin once a week for 28 days. 05/02/22 05/30/22  Junie Spencer, FNP      Allergies    Patient has no known allergies.    Review of Systems   Review of Systems  All other systems reviewed and are negative.   Physical Exam Updated Vital Signs BP (!) 161/128 (BP Location: Left Arm)   Pulse 93   Temp 98.4 F (36.9 C) (Oral)   Resp 15   Ht 1.727 m (5\' 8" )   Wt 136.1 kg   LMP 03/13/2022 (Approximate)   SpO2 98%   BMI 45.61 kg/m  Physical Exam Vitals and nursing note reviewed.  Constitutional:      General: She is not in acute distress.    Appearance: Normal appearance. She is well-developed. She is not toxic-appearing.  HENT:     Head: Normocephalic and atraumatic.  Eyes:     General: Lids are normal. No visual field deficit.    Conjunctiva/sclera:     Left eye: Left conjunctiva is injected. No hemorrhage.    Pupils: Pupils are equal,  round, and reactive to light.     Visual Fields: Right eye visual fields normal and left eye visual fields normal.  Neck:     Thyroid: No thyroid mass.     Trachea: No tracheal deviation.  Cardiovascular:     Rate and Rhythm: Normal rate and regular rhythm.     Heart sounds: Normal heart sounds. No murmur heard.    No gallop.  Pulmonary:     Effort: Pulmonary effort is normal. No respiratory distress.     Breath sounds: Normal breath sounds. No stridor. No decreased breath sounds, wheezing, rhonchi or rales.  Abdominal:     General: There is no distension.     Palpations: Abdomen is soft.     Tenderness: There is no abdominal tenderness. There is no rebound.  Musculoskeletal:         General: No tenderness. Normal range of motion.     Cervical back: Normal range of motion and neck supple.  Skin:    General: Skin is warm and dry.     Findings: No abrasion or rash.  Neurological:     General: No focal deficit present.     Mental Status: She is alert and oriented to person, place, and time. Mental status is at baseline.     GCS: GCS eye subscore is 4. GCS verbal subscore is 5. GCS motor subscore is 6.     Cranial Nerves: No cranial nerve deficit.     Sensory: No sensory deficit.     Motor: Motor function is intact.     Coordination: Coordination is intact.     Gait: Gait is intact.  Psychiatric:        Attention and Perception: Attention normal.        Speech: Speech normal.        Behavior: Behavior normal.     ED Results / Procedures / Treatments   Labs (all labs ordered are listed, but only abnormal results are displayed) Labs Reviewed - No data to display  EKG None  Radiology No results found.  Procedures Procedures    Medications Ordered in ED Medications  lactated ringers infusion (has no administration in time range)  metoCLOPramide (REGLAN) injection 5 mg (has no administration in time range)  diphenhydrAMINE (BENADRYL) injection 12.5 mg (has no administration in time range)    ED Course/ Medical Decision Making/ A&P                           Medical Decision Making Amount and/or Complexity of Data Reviewed Labs: ordered. Radiology: ordered.  Risk Prescription drug management.   Patient presented with symptoms concerning for migraine versus intracerebral process.  Head CT per my interpretation showed no acute process at this time.  Do not think that what she had was amaurosis fugax.  She had been having a headache prior to the incident.  Was medicated here and feels much better.  Neurological exam is remained normal throughout her assessment.  She has no visual impairment noted.  Pressure did improve after she was medicated.  Has  normal renal function here.  Plan will be for her to follow-up with her PCP for blood pressure management.  Family at bedside and informed of plan for discharge        Final Clinical Impression(s) / ED Diagnoses Final diagnoses:  None    Rx / DC Orders ED Discharge Orders     None  Lacretia Leigh, MD 04/13/22 1159

## 2022-04-13 NOTE — Discharge Instructions (Signed)
Follow-up with your doctor for a repeat blood pressure check °

## 2022-04-17 ENCOUNTER — Ambulatory Visit: Payer: 59 | Admitting: Family

## 2022-04-17 ENCOUNTER — Encounter: Payer: Self-pay | Admitting: Family

## 2022-04-17 VITALS — BP 140/98 | HR 122 | Temp 97.4°F | Ht 68.0 in | Wt 344.0 lb

## 2022-04-17 DIAGNOSIS — G43111 Migraine with aura, intractable, with status migrainosus: Secondary | ICD-10-CM | POA: Diagnosis not present

## 2022-04-17 DIAGNOSIS — I10 Essential (primary) hypertension: Secondary | ICD-10-CM | POA: Diagnosis not present

## 2022-04-17 DIAGNOSIS — R000 Tachycardia, unspecified: Secondary | ICD-10-CM

## 2022-04-17 MED ORDER — TOPIRAMATE 25 MG PO TABS: 25.0000 mg | ORAL_TABLET | Freq: Two times a day (BID) | ORAL | 1 refills | Status: DC

## 2022-04-17 MED ORDER — KETOROLAC TROMETHAMINE 60 MG/2ML IM SOLN
60.0000 mg | Freq: Once | INTRAMUSCULAR | Status: AC
  Administered 2022-04-17: 60 mg via INTRAMUSCULAR

## 2022-04-17 MED ORDER — SUMATRIPTAN SUCCINATE 100 MG PO TABS: 100.0000 mg | ORAL_TABLET | ORAL | 0 refills | Status: DC | PRN

## 2022-04-17 MED ORDER — HYDROCHLOROTHIAZIDE 12.5 MG PO TABS: 12.5000 mg | ORAL_TABLET | Freq: Every day | ORAL | 3 refills | Status: DC

## 2022-04-17 NOTE — Addendum Note (Signed)
Addended by: Jannifer Rodney A on: 04/17/2022 10:56 AM   Modules accepted: Orders

## 2022-04-17 NOTE — Addendum Note (Signed)
Addended by: Austin Miles F on: 04/17/2022 10:40 AM   Modules accepted: Orders

## 2022-04-17 NOTE — Progress Notes (Addendum)
Subjective:    Patient ID: Rebecca Gardner, female    DOB: [birth year], 27 y.o.   MRN: [id number]  Chief Complaint  Patient presents with   Hospitalization Follow-up   Pt presents to the office today for hospital follow up. She went to to the ED on 04/13/22 for headache and hypertension. She had a CT scan that was negative.   She reports she has had congestion and cough for the last several weeks.  Hypertension This is a chronic problem. The current episode started more than 1 year ago. The problem has been waxing and waning since onset. The problem is uncontrolled. Associated symptoms include headaches and malaise/fatigue. Pertinent negatives include no peripheral edema or shortness of breath. Risk factors for coronary artery disease include obesity and sedentary lifestyle. Past treatments include nothing. The current treatment provides no improvement. There is no history of heart failure.  Migraine  This is a recurrent problem. The current episode started 1 to 4 weeks ago. The problem occurs intermittently. The pain is located in the Left unilateral region. The pain quality is similar to prior headaches. The quality of the pain is described as shooting. Associated symptoms include nausea, phonophobia, photophobia and vomiting. The symptoms are aggravated by emotional stress. She has tried NSAIDs for the symptoms. The treatment provided mild relief. Her past medical history is significant for hypertension and migraine headaches.      Review of Systems  Constitutional:  Positive for malaise/fatigue.  Eyes:  Positive for photophobia.  Respiratory:  Negative for shortness of breath.   Gastrointestinal:  Positive for nausea and vomiting.  Neurological:  Positive for headaches.  All other systems reviewed and are negative.      Objective:   Physical Exam Vitals reviewed.  Constitutional:      General: She is not in acute distress.    Appearance: She is well-developed. She is obese.   HENT:     Head: Normocephalic and atraumatic.  Eyes:     Pupils: Pupils are equal, round, and reactive to light.  Neck:     Thyroid: No thyromegaly.  Cardiovascular:     Rate and Rhythm: Normal rate and regular rhythm.     Heart sounds: Normal heart sounds. No murmur heard. Pulmonary:     Effort: Pulmonary effort is normal. No respiratory distress.     Breath sounds: Normal breath sounds. No wheezing.  Abdominal:     General: Bowel sounds are normal. There is no distension.     Palpations: Abdomen is soft.     Tenderness: There is no abdominal tenderness.  Musculoskeletal:        General: No tenderness. Normal range of motion.     Cervical back: Normal range of motion and neck supple.  Skin:    General: Skin is warm and dry.  Neurological:     Mental Status: She is alert and oriented to person, place, and time.     Cranial Nerves: No cranial nerve deficit.     Deep Tendon Reflexes: Reflexes are normal and symmetric.  Psychiatric:        Behavior: Behavior normal.        Thought Content: Thought content normal.        Judgment: Judgment normal.       BP (!) 145/101   Pulse (!) 122   Temp (!) 97.4 F (36.3 C) (Temporal)   Ht _0  (1.727 m)   Wt (!) 344 lb (156 kg)   LMP 03/13/2022 (Approximate)  SpO2 97%   BMI 52.31 kg/m      Assessment & Plan:  Rebecca Gardner comes in today with chief complaint of Hospitalization Follow-up   Diagnosis and orders addressed:  1. Primary hypertension Start HCTZ 12.5  mg -Daily blood pressure log given with instructions on how to fill out and told to bring to next visit -Dash diet information given -Exercise encouraged - Stress Management  - hydrochlorothiazide (HYDRODIURIL) 12.5 MG tablet; Take 1 tablet (12.5 mg total) by mouth daily.  Dispense: 90 tablet; Refill: 3  2. Intractable migraine with aura with status migrainosus Start Topamax BID  Stress management  Limit Caffeine  - SUMAtriptan (IMITREX) 100 MG tablet; Take  1 tablet (100 mg total) by mouth every 2 (two) hours as needed for migraine. May repeat in 2 hours if headache persists or recurs.  Dispense: 10 tablet; Refill: 0 - topiramate (TOPAMAX) 25 MG tablet; Take 1 tablet (25 mg total) by mouth 2 (two) times daily.  Dispense: 60 tablet; Refill: 1 - ketorolac (TORADOL) injection 60 mg   3. Heart rate fast - EKG 12-Lead - CMP14+EGFR - CBC with Differential/Platelet  Evelina Dun, FNP

## 2022-04-17 NOTE — Patient Instructions (Signed)
Migraine Headache A migraine headache is an intense, throbbing pain on one side or both sides of the head. Migraine headaches may also cause other symptoms, such as nausea, vomiting, and sensitivity to light and noise. A migraine headache can last from 4 hours to 3 days. Talk with your doctor about what things may bring on (trigger) your migraine headaches. What are the causes? The exact cause of this condition is not known. However, a migraine may be caused when nerves in the brain become irritated and release chemicals that cause inflammation of blood vessels. This inflammation causes pain. This condition may be triggered or caused by: Drinking alcohol. Smoking. Taking medicines, such as: Medicine used to treat chest pain (nitroglycerin). Birth control pills. Estrogen. Certain blood pressure medicines. Eating or drinking products that contain nitrates, glutamate, aspartame, or tyramine. Aged cheeses, chocolate, or caffeine may also be triggers. Doing physical activity. Other things that may trigger a migraine headache include: Menstruation. Pregnancy. Hunger. Stress. Lack of sleep or too much sleep. Weather changes. Fatigue. What increases the risk? The following factors may make you more likely to experience migraine headaches: Being a certain age. This condition is more common in people who are 25-55 years old. Being female. Having a family history of migraine headaches. Being Caucasian. Having a mental health condition, such as depression or anxiety. Being obese. What are the signs or symptoms? The main symptom of this condition is pulsating or throbbing pain. This pain may: Happen in any area of the head, such as on one side or both sides. Interfere with daily activities. Get worse with physical activity. Get worse with exposure to bright lights or loud noises. Other symptoms may include: Nausea. Vomiting. Dizziness. General sensitivity to bright lights, loud noises, or  smells. Before you get a migraine headache, you may get warning signs (an aura). An aura may include: Seeing flashing lights or having blind spots. Seeing bright spots, halos, or zigzag lines. Having tunnel vision or blurred vision. Having numbness or a tingling feeling. Having trouble talking. Having muscle weakness. Some people have symptoms after a migraine headache (postdromal phase), such as: Feeling tired. Difficulty concentrating. How is this diagnosed? A migraine headache can be diagnosed based on: Your symptoms. A physical exam. Tests, such as: CT scan or an MRI of the head. These imaging tests can help rule out other causes of headaches. Taking fluid from the spine (lumbar puncture) and analyzing it (cerebrospinal fluid analysis, or CSF analysis). How is this treated? This condition may be treated with medicines that: Relieve pain. Relieve nausea. Prevent migraine headaches. Treatment for this condition may also include: Acupuncture. Lifestyle changes like avoiding foods that trigger migraine headaches. Biofeedback. Cognitive behavioral therapy. Follow these instructions at home: Medicines Take over-the-counter and prescription medicines only as told by your health care provider. Ask your health care provider if the medicine prescribed to you: Requires you to avoid driving or using heavy machinery. Can cause constipation. You may need to take these actions to prevent or treat constipation: Drink enough fluid to keep your urine pale yellow. Take over-the-counter or prescription medicines. Eat foods that are high in fiber, such as beans, whole grains, and fresh fruits and vegetables. Limit foods that are high in fat and processed sugars, such as fried or sweet foods. Lifestyle Do not drink alcohol. Do not use any products that contain nicotine or tobacco, such as cigarettes, e-cigarettes, and chewing tobacco. If you need help quitting, ask your health care  provider. Get at least 8   hours of sleep every night. Find ways to manage stress, such as meditation, deep breathing, or yoga. General instructions Keep a journal to find out what may trigger your migraine headaches. For example, write down: What you eat and drink. How much sleep you get. Any change to your diet or medicines. If you have a migraine headache: Avoid things that make your symptoms worse, such as bright lights. It may help to lie down in a dark, quiet room. Do not drive or use heavy machinery. Ask your health care provider what activities are safe for you while you are experiencing symptoms. Keep all follow-up visits as told by your health care provider. This is important. Contact a health care provider if: You develop symptoms that are different or more severe than your usual migraine headache symptoms. You have more than 15 headache days in one month. Get help right away if: Your migraine headache becomes severe. Your migraine headache lasts longer than 72 hours. You have a fever. You have a stiff neck. You have vision loss. Your muscles feel weak or like you cannot control them. You start to lose your balance often. You have trouble walking. You faint. You have a seizure. Summary A migraine headache is an intense, throbbing pain on one side or both sides of the head. Migraines may also cause other symptoms, such as nausea, vomiting, and sensitivity to light and noise. This condition may be treated with medicines and lifestyle changes. You may also need to avoid certain things that trigger a migraine headache. Keep a journal to find out what may trigger your migraine headaches. Contact your health care provider if you have more than 15 headache days in a month or you develop symptoms that are different or more severe than your usual migraine headache symptoms. This information is not intended to replace advice given to you by your health care provider. Make sure you  discuss any questions you have with your health care provider. Document Revised: 11/06/2021 Document Reviewed: 07/07/2018 Elsevier Patient Education  2023 [employer]  

## 2022-04-18 LAB — CBC WITH DIFFERENTIAL/PLATELET
Basophils Absolute: 0 10*3/uL (ref 0.0–0.2)
Basos: 0 %
EOS (ABSOLUTE): 0.2 10*3/uL (ref 0.0–0.4)
Eos: 2 %
Hematocrit: 43.2 % (ref 34.0–46.6)
Hemoglobin: 14.2 g/dL (ref 11.1–15.9)
Immature Grans (Abs): 0 10*3/uL (ref 0.0–0.1)
Immature Granulocytes: 0 %
Lymphocytes Absolute: 2.7 10*3/uL (ref 0.7–3.1)
Lymphs: 29 %
MCH: 26.2 pg — ABNORMAL LOW (ref 26.6–33.0)
MCHC: 32.9 g/dL (ref 31.5–35.7)
MCV: 80 fL (ref 79–97)
Monocytes Absolute: 0.6 10*3/uL (ref 0.1–0.9)
Monocytes: 7 %
Neutrophils Absolute: 5.6 10*3/uL (ref 1.4–7.0)
Neutrophils: 62 %
Platelets: 358 10*3/uL (ref 150–450)
RBC: 5.43 x10E6/uL — ABNORMAL HIGH (ref 3.77–5.28)
RDW: 13.5 % (ref 11.7–15.4)
WBC: 9.1 10*3/uL (ref 3.4–10.8)

## 2022-04-18 LAB — CMP14+EGFR
ALT: 24 IU/L (ref 0–32)
AST: 24 IU/L (ref 0–40)
Albumin/Globulin Ratio: 1.2 (ref 1.2–2.2)
Albumin: 4.1 g/dL (ref 4.0–5.0)
Alkaline Phosphatase: 61 IU/L (ref 44–121)
BUN/Creatinine Ratio: 9 (ref 9–23)
BUN: 7 mg/dL (ref 6–20)
Bilirubin Total: <0.2 mg/dL (ref 0.0–1.2)
CO2: 19 mmol/L — ABNORMAL LOW (ref 20–29)
Calcium: 9.2 mg/dL (ref 8.7–10.2)
Chloride: 104 mmol/L (ref 96–106)
Creatinine, Ser: 0.81 mg/dL (ref 0.57–1.00)
Globulin, Total: 3.4 g/dL (ref 1.5–4.5)
Glucose: 87 mg/dL (ref 70–99)
Potassium: 3.8 mmol/L (ref 3.5–5.2)
Sodium: 139 mmol/L (ref 134–144)
Total Protein: 7.5 g/dL (ref 6.0–8.5)
eGFR: 103 mL/min/{1.73_m2} (ref >59)

## 2022-05-04 ENCOUNTER — Other Ambulatory Visit (HOSPITAL_COMMUNITY): Payer: Self-pay

## 2022-05-05 ENCOUNTER — Ambulatory Visit: Payer: 59 | Admitting: Family

## 2022-05-05 ENCOUNTER — Encounter: Payer: Self-pay | Admitting: Family

## 2022-05-05 VITALS — BP 136/89 | HR 106 | Temp 97.5°F | Ht 68.0 in | Wt 343.0 lb

## 2022-05-05 DIAGNOSIS — G43909 Migraine, unspecified, not intractable, without status migrainosus: Secondary | ICD-10-CM | POA: Insufficient documentation

## 2022-05-05 DIAGNOSIS — Z713 Dietary counseling and surveillance: Secondary | ICD-10-CM

## 2022-05-05 DIAGNOSIS — I10 Essential (primary) hypertension: Secondary | ICD-10-CM

## 2022-05-05 DIAGNOSIS — G43109 Migraine with aura, not intractable, without status migrainosus: Secondary | ICD-10-CM

## 2022-05-05 MED ORDER — TOPIRAMATE 50 MG PO TABS: 50.0000 mg | ORAL_TABLET | Freq: Two times a day (BID) | ORAL | 1 refills | Status: DC

## 2022-05-05 MED ORDER — WEGOVY 0.5 MG/0.5ML ~~LOC~~ SOAJ: 0.5000 mg | SUBCUTANEOUS | 2 refills | Status: DC

## 2022-05-05 NOTE — Progress Notes (Signed)
Subjective:    Patient ID: Rebecca Gardner, female    DOB: Oct 04, 1995, 26 y.o.   MRN: [id number]  Chief Complaint  Patient presents with   Follow-up   Pt presents to the office today to recheck headache and HTN. We started her on HTCZ 12.5 mg. Her BP is at goal today.   We also started her on Topamax 25 mg BID. She reports she had a headache two weeks ago. Took a Imitrex that helped.   She also has not picked up her Weogy rx because there is a national back order of 0.25 mg.  Hypertension This is a chronic problem. The current episode started more than 1 year ago. The problem has been resolved since onset. The problem is controlled. Pertinent negatives include no malaise/fatigue, peripheral edema or shortness of breath. Risk factors for coronary artery disease include dyslipidemia, obesity and sedentary lifestyle. The current treatment provides moderate improvement.  Migraine  This is a chronic problem. The current episode started 1 to 4 weeks ago. The problem occurs intermittently. The pain is located in the Left unilateral region. The quality of the pain is described as aching. Associated symptoms include phonophobia and photophobia. She has tried NSAIDs and triptans for the symptoms. The treatment provided moderate relief. Her past medical history is significant for hypertension and migraine headaches.      Review of Systems  Constitutional:  Negative for malaise/fatigue.  Eyes:  Positive for photophobia.  Respiratory:  Negative for shortness of breath.        Objective:   Physical Exam Vitals reviewed.  Constitutional:      General: She is not in acute distress.    Appearance: She is well-developed. She is obese.  HENT:     Head: Normocephalic and atraumatic.  Eyes:     Pupils: Pupils are equal, round, and reactive to light.  Neck:     Thyroid: No thyromegaly.  Cardiovascular:     Rate and Rhythm: Normal rate and regular rhythm.     Heart sounds: Normal heart sounds.  No murmur heard. Pulmonary:     Effort: Pulmonary effort is normal. No respiratory distress.     Breath sounds: Normal breath sounds. No wheezing.  Abdominal:     General: Bowel sounds are normal. There is no distension.     Palpations: Abdomen is soft.     Tenderness: There is no abdominal tenderness.  Musculoskeletal:        General: No tenderness. Normal range of motion.     Cervical back: Normal range of motion and neck supple.  Skin:    General: Skin is warm and dry.  Neurological:     Mental Status: She is alert and oriented to person, place, and time.     Cranial Nerves: No cranial nerve deficit.     Deep Tendon Reflexes: Reflexes are normal and symmetric.  Psychiatric:        Behavior: Behavior normal.        Thought Content: Thought content normal.        Judgment: Judgment normal.       BP 136/89   Pulse (!) 106   Temp (!) 97.5 F (36.4 C) (Temporal)   Ht 5\' 8"  (1.727 m)   Wt (!) 343 lb (155.6 kg)   LMP 03/13/2022 (Approximate)   SpO2 99%   BMI 52.15 kg/m      Assessment & Plan:  JARI CAROLLO comes in today with chief complaint of Follow-up  Diagnosis and orders addressed:  1. Benign hypertension  2. Morbid obesity (HCC)  3. Migraine with aura and without status migrainosus, not intractable Will increaes Topamax to 50 mg BID from 25 mg Stress management  Limit caffiene  - topiramate (TOPAMAX) 50 MG tablet; Take 1 tablet (50 mg total) by mouth 2 (two) times daily.  Dispense: 180 tablet; Refill: 1  4. Weight loss counseling, encounter for Start Wegovy 0.5 mg  Encourage healthy diet - Semaglutide-Weight Management (WEGOVY) 0.5 MG/0.5ML SOAJ; Inject 0.5 mg into the skin once a week.  Dispense: 2 mL; Refill: 2   Labs pending Health Maintenance reviewed Diet and exercise encouraged  Follow up plan: 1 month   Jannifer Rodney, FNP

## 2022-05-05 NOTE — Patient Instructions (Signed)
Migraine Headache A migraine headache is an intense, throbbing pain on one side or both sides of the head. Migraine headaches may also cause other symptoms, such as nausea, vomiting, and sensitivity to light and noise. A migraine headache can last from 4 hours to 3 days. Talk with your doctor about what things may bring on (trigger) your migraine headaches. What are the causes? The exact cause of this condition is not known. However, a migraine may be caused when nerves in the brain become irritated and release chemicals that cause inflammation of blood vessels. This inflammation causes pain. This condition may be triggered or caused by: Drinking alcohol. Smoking. Taking medicines, such as: Medicine used to treat chest pain (nitroglycerin). Birth control pills. Estrogen. Certain blood pressure medicines. Eating or drinking products that contain nitrates, glutamate, aspartame, or tyramine. Aged cheeses, chocolate, or caffeine may also be triggers. Doing physical activity. Other things that may trigger a migraine headache include: Menstruation. Pregnancy. Hunger. Stress. Lack of sleep or too much sleep. Weather changes. Fatigue. What increases the risk? The following factors may make you more likely to experience migraine headaches: Being a certain age. This condition is more common in people who are 25-55 years old. Being female. Having a family history of migraine headaches. Being Caucasian. Having a mental health condition, such as depression or anxiety. Being obese. What are the signs or symptoms? The main symptom of this condition is pulsating or throbbing pain. This pain may: Happen in any area of the head, such as on one side or both sides. Interfere with daily activities. Get worse with physical activity. Get worse with exposure to bright lights or loud noises. Other symptoms may include: Nausea. Vomiting. Dizziness. General sensitivity to bright lights, loud noises, or  smells. Before you get a migraine headache, you may get warning signs (an aura). An aura may include: Seeing flashing lights or having blind spots. Seeing bright spots, halos, or zigzag lines. Having tunnel vision or blurred vision. Having numbness or a tingling feeling. Having trouble talking. Having muscle weakness. Some people have symptoms after a migraine headache (postdromal phase), such as: Feeling tired. Difficulty concentrating. How is this diagnosed? A migraine headache can be diagnosed based on: Your symptoms. A physical exam. Tests, such as: CT scan or an MRI of the head. These imaging tests can help rule out other causes of headaches. Taking fluid from the spine (lumbar puncture) and analyzing it (cerebrospinal fluid analysis, or CSF analysis). How is this treated? This condition may be treated with medicines that: Relieve pain. Relieve nausea. Prevent migraine headaches. Treatment for this condition may also include: Acupuncture. Lifestyle changes like avoiding foods that trigger migraine headaches. Biofeedback. Cognitive behavioral therapy. Follow these instructions at home: Medicines Take over-the-counter and prescription medicines only as told by your health care provider. Ask your health care provider if the medicine prescribed to you: Requires you to avoid driving or using heavy machinery. Can cause constipation. You may need to take these actions to prevent or treat constipation: Drink enough fluid to keep your urine pale yellow. Take over-the-counter or prescription medicines. Eat foods that are high in fiber, such as beans, whole grains, and fresh fruits and vegetables. Limit foods that are high in fat and processed sugars, such as fried or sweet foods. Lifestyle Do not drink alcohol. Do not use any products that contain nicotine or tobacco, such as cigarettes, e-cigarettes, and chewing tobacco. If you need help quitting, ask your health care  provider. Get at least 8   hours of sleep every night. Find ways to manage stress, such as meditation, deep breathing, or yoga. General instructions Keep a journal to find out what may trigger your migraine headaches. For example, write down: What you eat and drink. How much sleep you get. Any change to your diet or medicines. If you have a migraine headache: Avoid things that make your symptoms worse, such as bright lights. It may help to lie down in a dark, quiet room. Do not drive or use heavy machinery. Ask your health care provider what activities are safe for you while you are experiencing symptoms. Keep all follow-up visits as told by your health care provider. This is important. Contact a health care provider if: You develop symptoms that are different or more severe than your usual migraine headache symptoms. You have more than 15 headache days in one month. Get help right away if: Your migraine headache becomes severe. Your migraine headache lasts longer than 72 hours. You have a fever. You have a stiff neck. You have vision loss. Your muscles feel weak or like you cannot control them. You start to lose your balance often. You have trouble walking. You faint. You have a seizure. Summary A migraine headache is an intense, throbbing pain on one side or both sides of the head. Migraines may also cause other symptoms, such as nausea, vomiting, and sensitivity to light and noise. This condition may be treated with medicines and lifestyle changes. You may also need to avoid certain things that trigger a migraine headache. Keep a journal to find out what may trigger your migraine headaches. Contact your health care provider if you have more than 15 headache days in a month or you develop symptoms that are different or more severe than your usual migraine headache symptoms. This information is not intended to replace advice given to you by your health care provider. Make sure you  discuss any questions you have with your health care provider. Document Revised: 11/06/2021 Document Reviewed: 07/07/2018 Elsevier Patient Education  2023 [employer]  

## 2022-05-06 ENCOUNTER — Other Ambulatory Visit (HOSPITAL_COMMUNITY): Payer: Self-pay

## 2022-05-08 ENCOUNTER — Emergency Department (HOSPITAL_BASED_OUTPATIENT_CLINIC_OR_DEPARTMENT_OTHER)
Admission: EM | Admit: 2022-05-08 | Discharge: 2022-05-08 | Disposition: A | Discharge status: Home / Self Care | Payer: 59 | Source: Home / Self Care | Attending: Emergency Medicine | Admitting: Emergency Medicine

## 2022-05-08 ENCOUNTER — Other Ambulatory Visit: Payer: Self-pay

## 2022-05-08 ENCOUNTER — Encounter (HOSPITAL_BASED_OUTPATIENT_CLINIC_OR_DEPARTMENT_OTHER): Payer: Self-pay

## 2022-05-08 ENCOUNTER — Observation Stay (HOSPITAL_BASED_OUTPATIENT_CLINIC_OR_DEPARTMENT_OTHER)
Admission: EM | Admit: 2022-05-08 | Discharge: 2022-05-10 | Disposition: A | Discharge status: Home / Self Care | Payer: 59 | Attending: Internal Medicine | Admitting: Internal Medicine

## 2022-05-08 ENCOUNTER — Emergency Department (HOSPITAL_BASED_OUTPATIENT_CLINIC_OR_DEPARTMENT_OTHER): Payer: 59

## 2022-05-08 ENCOUNTER — Encounter (HOSPITAL_COMMUNITY): Payer: Self-pay

## 2022-05-08 DIAGNOSIS — R109 Unspecified abdominal pain: Secondary | ICD-10-CM | POA: Diagnosis present

## 2022-05-08 DIAGNOSIS — R112 Nausea with vomiting, unspecified: Secondary | ICD-10-CM | POA: Insufficient documentation

## 2022-05-08 DIAGNOSIS — R7401 Elevation of levels of liver transaminase levels: Secondary | ICD-10-CM | POA: Insufficient documentation

## 2022-05-08 DIAGNOSIS — Z1152 Encounter for screening for COVID-19: Secondary | ICD-10-CM | POA: Diagnosis not present

## 2022-05-08 DIAGNOSIS — R000 Tachycardia, unspecified: Secondary | ICD-10-CM | POA: Insufficient documentation

## 2022-05-08 DIAGNOSIS — D72829 Elevated white blood cell count, unspecified: Secondary | ICD-10-CM | POA: Insufficient documentation

## 2022-05-08 DIAGNOSIS — K529 Noninfective gastroenteritis and colitis, unspecified: Secondary | ICD-10-CM | POA: Diagnosis present

## 2022-05-08 DIAGNOSIS — I10 Essential (primary) hypertension: Secondary | ICD-10-CM | POA: Diagnosis not present

## 2022-05-08 DIAGNOSIS — Z7985 Long-term (current) use of injectable non-insulin antidiabetic drugs: Secondary | ICD-10-CM | POA: Insufficient documentation

## 2022-05-08 DIAGNOSIS — Z6841 Body Mass Index (BMI) 40.0 and over, adult: Secondary | ICD-10-CM | POA: Insufficient documentation

## 2022-05-08 DIAGNOSIS — E876 Hypokalemia: Secondary | ICD-10-CM | POA: Insufficient documentation

## 2022-05-08 DIAGNOSIS — R1084 Generalized abdominal pain: Secondary | ICD-10-CM | POA: Insufficient documentation

## 2022-05-08 DIAGNOSIS — R197 Diarrhea, unspecified: Secondary | ICD-10-CM | POA: Insufficient documentation

## 2022-05-08 DIAGNOSIS — F411 Generalized anxiety disorder: Secondary | ICD-10-CM | POA: Diagnosis present

## 2022-05-08 DIAGNOSIS — A0839 Other viral enteritis: Secondary | ICD-10-CM | POA: Diagnosis not present

## 2022-05-08 DIAGNOSIS — N200 Calculus of kidney: Secondary | ICD-10-CM | POA: Diagnosis not present

## 2022-05-08 DIAGNOSIS — Z794 Long term (current) use of insulin: Secondary | ICD-10-CM | POA: Insufficient documentation

## 2022-05-08 DIAGNOSIS — R1031 Right lower quadrant pain: Secondary | ICD-10-CM | POA: Diagnosis not present

## 2022-05-08 DIAGNOSIS — F320 Major depressive disorder, single episode, mild: Secondary | ICD-10-CM | POA: Diagnosis present

## 2022-05-08 DIAGNOSIS — Z79899 Other long term (current) drug therapy: Secondary | ICD-10-CM | POA: Insufficient documentation

## 2022-05-08 DIAGNOSIS — K760 Fatty (change of) liver, not elsewhere classified: Secondary | ICD-10-CM | POA: Insufficient documentation

## 2022-05-08 LAB — URINALYSIS, ROUTINE W REFLEX MICROSCOPIC
Bilirubin Urine: NEGATIVE
Bilirubin Urine: NEGATIVE
Glucose, UA: NEGATIVE mg/dL
Glucose, UA: NEGATIVE mg/dL
Ketones, ur: NEGATIVE mg/dL
Ketones, ur: NEGATIVE mg/dL
Leukocytes,Ua: NEGATIVE
Nitrite: NEGATIVE
Nitrite: NEGATIVE
Protein, ur: 30 mg/dL — AB
Specific Gravity, Urine: 1.023 (ref 1.005–1.030)
Specific Gravity, Urine: 1.036 — ABNORMAL HIGH (ref 1.005–1.030)
pH: 5.5 (ref 5.0–8.0)
pH: 6 (ref 5.0–8.0)

## 2022-05-08 LAB — CBC WITH DIFFERENTIAL/PLATELET
Abs Immature Granulocytes: 0.03 10*3/uL (ref 0.00–0.07)
Basophils Absolute: 0 10*3/uL (ref 0.0–0.1)
Basophils Relative: 0 %
Eosinophils Absolute: 0.1 10*3/uL (ref 0.0–0.5)
Eosinophils Relative: 1 %
HCT: 42.3 % (ref 36.0–46.0)
Hemoglobin: 14.2 g/dL (ref 12.0–15.0)
Immature Granulocytes: 0 %
Lymphocytes Relative: 24 %
Lymphs Abs: 2.7 10*3/uL (ref 0.7–4.0)
MCH: 26.2 pg (ref 26.0–34.0)
MCHC: 33.6 g/dL (ref 30.0–36.0)
MCV: 77.9 fL — ABNORMAL LOW (ref 80.0–100.0)
Monocytes Absolute: 0.8 10*3/uL (ref 0.1–1.0)
Monocytes Relative: 7 %
Neutro Abs: 7.5 10*3/uL (ref 1.7–7.7)
Neutrophils Relative %: 68 %
Platelets: 322 10*3/uL (ref 150–400)
RBC: 5.43 MIL/uL — ABNORMAL HIGH (ref 3.87–5.11)
RDW: 14.1 % (ref 11.5–15.5)
WBC: 11.1 10*3/uL — ABNORMAL HIGH (ref 4.0–10.5)
nRBC: 0 % (ref 0.0–0.2)

## 2022-05-08 LAB — COMPREHENSIVE METABOLIC PANEL
ALT: 26 U/L (ref 0–44)
ALT: 53 U/L — ABNORMAL HIGH (ref 0–44)
AST: 30 U/L (ref 15–41)
AST: 61 U/L — ABNORMAL HIGH (ref 15–41)
Albumin: 4 g/dL (ref 3.5–5.0)
Albumin: 4 g/dL (ref 3.5–5.0)
Alkaline Phosphatase: 60 U/L (ref 38–126)
Alkaline Phosphatase: 123 U/L (ref 38–126)
Anion gap: 12 (ref 5–15)
Anion gap: 10 (ref 5–15)
BUN: 10 mg/dL (ref 6–20)
BUN: 8 mg/dL (ref 6–20)
CO2: 17 mmol/L — ABNORMAL LOW (ref 22–32)
CO2: 20 mmol/L — ABNORMAL LOW (ref 22–32)
Calcium: 8.5 mg/dL — ABNORMAL LOW (ref 8.9–10.3)
Calcium: 8.5 mg/dL — ABNORMAL LOW (ref 8.9–10.3)
Chloride: 105 mmol/L (ref 98–111)
Chloride: 105 mmol/L (ref 98–111)
Creatinine, Ser: 0.9 mg/dL (ref 0.44–1.00)
Creatinine, Ser: 0.84 mg/dL (ref 0.44–1.00)
GFR, Estimated: >60 mL/min (ref >60)
GFR, Estimated: >60 mL/min (ref >60)
Glucose, Bld: 108 mg/dL — ABNORMAL HIGH (ref 70–99)
Glucose, Bld: 96 mg/dL (ref 70–99)
Potassium: 3.1 mmol/L — ABNORMAL LOW (ref 3.5–5.1)
Potassium: 3 mmol/L — ABNORMAL LOW (ref 3.5–5.1)
Sodium: 134 mmol/L — ABNORMAL LOW (ref 135–145)
Sodium: 135 mmol/L (ref 135–145)
Total Bilirubin: 0.3 mg/dL (ref 0.3–1.2)
Total Bilirubin: 0.3 mg/dL (ref 0.3–1.2)
Total Protein: 7.6 g/dL (ref 6.5–8.1)
Total Protein: 7.7 g/dL (ref 6.5–8.1)

## 2022-05-08 LAB — PHOSPHORUS: Phosphorus: 1.9 mg/dL — ABNORMAL LOW (ref 2.5–4.6)

## 2022-05-08 LAB — CBC
HCT: 43 % (ref 36.0–46.0)
Hemoglobin: 14.1 g/dL (ref 12.0–15.0)
MCH: 25.9 pg — ABNORMAL LOW (ref 26.0–34.0)
MCHC: 32.8 g/dL (ref 30.0–36.0)
MCV: 78.9 fL — ABNORMAL LOW (ref 80.0–100.0)
Platelets: 314 10*3/uL (ref 150–400)
RBC: 5.45 MIL/uL — ABNORMAL HIGH (ref 3.87–5.11)
RDW: 14.3 % (ref 11.5–15.5)
WBC: 7 10*3/uL (ref 4.0–10.5)
nRBC: 0 % (ref 0.0–0.2)

## 2022-05-08 LAB — PREGNANCY, URINE
Preg Test, Ur: NEGATIVE
Preg Test, Ur: NEGATIVE

## 2022-05-08 LAB — LIPASE, BLOOD
Lipase: 16 U/L (ref 11–51)
Lipase: 55 U/L — ABNORMAL HIGH (ref 11–51)

## 2022-05-08 LAB — MAGNESIUM: Magnesium: 1.8 mg/dL (ref 1.7–2.4)

## 2022-05-08 LAB — RESP PANEL BY RT-PCR (FLU A&B, COVID) ARPGX2
Influenza A by PCR: NEGATIVE
Influenza B by PCR: NEGATIVE
SARS Coronavirus 2 by RT PCR: NEGATIVE

## 2022-05-08 LAB — C DIFFICILE QUICK SCREEN W PCR REFLEX
C Diff antigen: NEGATIVE
C Diff interpretation: NOT DETECTED
C Diff toxin: NEGATIVE

## 2022-05-08 MED ORDER — POTASSIUM CHLORIDE IN NACL 20-0.9 MEQ/L-% IV SOLN
INTRAVENOUS | Status: DC
  Filled 2022-05-08 (×6): qty 1000

## 2022-05-08 MED ORDER — HYDROMORPHONE HCL 1 MG/ML IJ SOLN
1.0000 mg | INTRAMUSCULAR | Status: DC | PRN
  Administered 2022-05-08: 1 mg via INTRAVENOUS
  Filled 2022-05-08 (×2): qty 1

## 2022-05-08 MED ORDER — MORPHINE SULFATE (PF) 4 MG/ML IV SOLN
4.0000 mg | Freq: Once | INTRAVENOUS | Status: AC
  Administered 2022-05-08: 4 mg via INTRAVENOUS
  Filled 2022-05-08: qty 1

## 2022-05-08 MED ORDER — POTASSIUM CHLORIDE CRYS ER 20 MEQ PO TBCR: 20.0000 meq | EXTENDED_RELEASE_TABLET | Freq: Every day | ORAL | 0 refills | Status: DC

## 2022-05-08 MED ORDER — ONDANSETRON HCL 4 MG PO TABS: 4.0000 mg | ORAL_TABLET | Freq: Four times a day (QID) | ORAL | Status: DC | PRN

## 2022-05-08 MED ORDER — ONDANSETRON 4 MG PO TBDP: 4.0000 mg | ORAL_TABLET | Freq: Three times a day (TID) | ORAL | 0 refills | Status: DC | PRN

## 2022-05-08 MED ORDER — ONDANSETRON HCL 4 MG/2ML IJ SOLN
4.0000 mg | Freq: Four times a day (QID) | INTRAMUSCULAR | Status: DC | PRN
  Administered 2022-05-09: 4 mg via INTRAVENOUS
  Filled 2022-05-08: qty 2

## 2022-05-08 MED ORDER — TOPIRAMATE 25 MG PO TABS
50.0000 mg | ORAL_TABLET | Freq: Two times a day (BID) | ORAL | Status: DC
  Administered 2022-05-08 – 2022-05-10 (×4): 50 mg via ORAL
  Filled 2022-05-08 (×4): qty 2

## 2022-05-08 MED ORDER — SODIUM CHLORIDE 0.9 % IV BOLUS
1000.0000 mL | Freq: Once | INTRAVENOUS | Status: AC
  Administered 2022-05-08: 1000 mL via INTRAVENOUS

## 2022-05-08 MED ORDER — ONDANSETRON HCL 4 MG/2ML IJ SOLN
4.0000 mg | Freq: Once | INTRAMUSCULAR | Status: AC
  Administered 2022-05-08: 4 mg via INTRAVENOUS
  Filled 2022-05-08: qty 2

## 2022-05-08 MED ORDER — ONDANSETRON HCL 4 MG/2ML IJ SOLN
4.0000 mg | Freq: Four times a day (QID) | INTRAMUSCULAR | Status: DC | PRN
  Administered 2022-05-08: 4 mg via INTRAVENOUS
  Filled 2022-05-08: qty 2

## 2022-05-08 MED ORDER — ENOXAPARIN SODIUM 40 MG/0.4ML IJ SOSY
40.0000 mg | PREFILLED_SYRINGE | INTRAMUSCULAR | Status: DC
  Administered 2022-05-09 – 2022-05-10 (×2): 40 mg via SUBCUTANEOUS
  Filled 2022-05-08 (×2): qty 0.4

## 2022-05-08 MED ORDER — SENNOSIDES-DOCUSATE SODIUM 8.6-50 MG PO TABS: 1.0000 | ORAL_TABLET | Freq: Every evening | ORAL | Status: DC | PRN

## 2022-05-08 MED ORDER — POTASSIUM CHLORIDE CRYS ER 20 MEQ PO TBCR
40.0000 meq | EXTENDED_RELEASE_TABLET | Freq: Once | ORAL | Status: AC
  Administered 2022-05-08: 40 meq via ORAL
  Filled 2022-05-08: qty 2

## 2022-05-08 MED ORDER — POTASSIUM CHLORIDE 10 MEQ/100ML IV SOLN
10.0000 meq | Freq: Once | INTRAVENOUS | Status: AC
  Administered 2022-05-08: 10 meq via INTRAVENOUS
  Filled 2022-05-08: qty 100

## 2022-05-08 MED ORDER — KETOROLAC TROMETHAMINE 30 MG/ML IJ SOLN
30.0000 mg | Freq: Once | INTRAMUSCULAR | Status: DC
  Filled 2022-05-08: qty 1

## 2022-05-08 MED ORDER — LACTATED RINGERS IV BOLUS
1000.0000 mL | Freq: Once | INTRAVENOUS | Status: AC
  Administered 2022-05-08: 1000 mL via INTRAVENOUS

## 2022-05-08 MED ORDER — ONDANSETRON HCL 4 MG/2ML IJ SOLN
4.0000 mg | Freq: Once | INTRAMUSCULAR | Status: AC | PRN
  Administered 2022-05-08: 4 mg via INTRAVENOUS
  Filled 2022-05-08: qty 2

## 2022-05-08 MED ORDER — IOHEXOL 300 MG/ML  SOLN
100.0000 mL | Freq: Once | INTRAMUSCULAR | Status: AC | PRN
  Administered 2022-05-08: 100 mL via INTRAVENOUS

## 2022-05-08 MED ORDER — DICYCLOMINE HCL 20 MG PO TABS: 20.0000 mg | ORAL_TABLET | Freq: Two times a day (BID) | ORAL | 0 refills | Status: DC

## 2022-05-08 NOTE — ED Provider Notes (Signed)
[facility] EMERGENCY DEPT  Provider Note  CSN: [id number] Arrival date & time: 05/08/22 9798  History Chief Complaint  Patient presents with   Abdominal Pain    Rebecca Gardner is a 26 y.o. female with prior history of cholecystectomy reports 24 hours of N/V/D and severe R sided abdominal pain. No blood in stool or emesis. Pain began radiating into R lower back tonight prompting her to come to the ED.    Home Medications Prior to Admission medications   Medication Sig Start Date End Date Taking? Authorizing Provider  dicyclomine (BENTYL) 20 MG tablet Take 1 tablet (20 mg total) by mouth 2 (two) times daily. 05/08/22  Yes Pollyann Savoy, MD  ondansetron (ZOFRAN-ODT) 4 MG disintegrating tablet Take 1 tablet (4 mg total) by mouth every 8 (eight) hours as needed for nausea or vomiting. 05/08/22  Yes Pollyann Savoy, MD  acetaminophen (TYLENOL) 325 MG tablet Take 650 mg by mouth every 6 (six) hours as needed for headache.    [provider]  clobetasol cream (TEMOVATE) 0.05 % Apply 1 application topically 2 (two) times daily. 09/12/20   Jannifer Rodney A, FNP  hydrochlorothiazide (HYDRODIURIL) 12.5 MG tablet Take 1 tablet (12.5 mg total) by mouth daily. 04/17/22   Junie Spencer, FNP  norgestimate-ethinyl estradiol (ORTHO-CYCLEN) 0.25-35 MG-MCG tablet TAKE 1 TABLET BY MOUTH EVERY DAY 02/18/22   Jannifer Rodney A, FNP  omeprazole (PRILOSEC) 20 MG capsule Take 1 capsule (20 mg total) by mouth daily. 03/05/22   Junie Spencer, FNP  Semaglutide-Weight Management (WEGOVY) 0.5 MG/0.5ML SOAJ Inject 0.5 mg into the skin once a week. 05/05/22   Junie Spencer, FNP  SUMAtriptan (IMITREX) 100 MG tablet Take 1 tablet (100 mg total) by mouth every 2 (two) hours as needed for migraine. May repeat in 2 hours if headache persists or recurs. 04/17/22   Junie Spencer, FNP  topiramate (TOPAMAX) 50 MG tablet Take 1 tablet (50 mg total) by mouth 2 (two) times daily. 05/05/22   Junie Spencer, FNP     Allergies    Patient has no known allergies.   Review of Systems   Review of Systems Please see HPI for pertinent positives and negatives  Physical Exam BP 130/85   Pulse 94   Temp 97.8 F (36.6 C)   Resp 16   Ht 5\' 8"  (1.727 m)   Wt (!) 149.7 kg   LMP 04/11/2022 (Approximate)   SpO2 99%   BMI 50.18 kg/m   Physical Exam Vitals and nursing note reviewed.  Constitutional:      Appearance: Normal appearance.  HENT:     Head: Normocephalic and atraumatic.     Nose: Nose normal.     Mouth/Throat:     Mouth: Mucous membranes are moist.  Eyes:     Extraocular Movements: Extraocular movements intact.     Conjunctiva/sclera: Conjunctivae normal.  Cardiovascular:     Rate and Rhythm: Tachycardia present.  Pulmonary:     Effort: Pulmonary effort is normal.     Breath sounds: Normal breath sounds.  Abdominal:     General: Abdomen is flat.     Palpations: Abdomen is soft.     Tenderness: There is abdominal tenderness in the right upper quadrant and right lower quadrant. There is no guarding or rebound. Positive signs include McBurney's sign. Negative signs include Murphy's sign and Rovsing's sign.  Musculoskeletal:        General: No swelling. Normal range of  motion.     Cervical back: Neck supple.  Skin:    General: Skin is warm and dry.  Neurological:     General: No focal deficit present.     Mental Status: She is alert.  Psychiatric:        Mood and Affect: Mood normal.     ED Results / Procedures / Treatments   EKG EKG Interpretation  Date/Time:  Friday May 08 2022 04:05:57 EST Ventricular Rate:  126 PR Interval:  123 QRS Duration: 88 QT Interval:  327 QTC Calculation: 474 R Axis:   39 Text Interpretation: Sinus tachycardia Minimal ST depression, inferior leads No significant change since last tracing Confirmed by Susy Frizzle [id number]) on 05/08/2022 4:23:27 AM  Procedures Procedures  Medications Ordered in the  ED Medications  ondansetron (ZOFRAN) injection 4 mg (has no administration in time range)  morphine (PF) 4 MG/ML injection 4 mg (has no administration in time range)  potassium chloride SA (KLOR-CON M) CR tablet 40 mEq (has no administration in time range)  morphine (PF) 4 MG/ML injection 4 mg (4 mg Intravenous Given 05/08/22 0429)  ondansetron (ZOFRAN) injection 4 mg (4 mg Intravenous Given 05/08/22 0430)  lactated ringers bolus 1,000 mL (1,000 mLs Intravenous New Bag/Given 05/08/22 0428)  morphine (PF) 4 MG/ML injection 4 mg (4 mg Intravenous Given 05/08/22 0508)  iohexol (OMNIPAQUE) 300 MG/ML solution 100 mL (100 mLs Intravenous Contrast Given 05/08/22 6606)    Initial Impression and Plan  Patient here with NVD and abdominal pain, primarily on the right, radiating into her back. Has had prior cholecystectomy, consider appendicitis, colitis SBO or gastroenteritis. Will check labs, send for CT. Pain/nausea meds and IVF for comfort.   ED Course   Clinical Course as of 05/08/22 0702  Fri May 08, 2022  0430 CBC with mild leukocytosis. [CS]  [id number] CMP with mild hypokalemia, will replete orally when vomiting is resolved. Lipase is normal.  [CS]  0612 UA with signs of dehydration, no convincing infection. HCG is neg.  [CS]  0109 Per RN, patient's pain and nausea returned while lying flat for CT. Additional meds ordered while awaiting CT report.  [CS]  [id number] I personally viewed the images from radiology studies and agree with radiologist interpretation: CT is neg for acute process. Patient aware of incidental kidney stones. Plan PO trial and if tolerating anticipate she will be able to go home with Rx for Zofran and bentyl.   [CS]    Clinical Course User Index [CS] Pollyann Savoy, MD     MDM Rules/Calculators/A&P Medical Decision Making Problems Addressed: Generalized abdominal pain: acute illness or injury Hypokalemia: acute illness or injury Nausea vomiting and diarrhea: acute illness or  injury  Amount and/or Complexity of Data Reviewed Labs: ordered. Decision-making details documented in ED Course. Radiology: ordered and independent interpretation performed. Decision-making details documented in ED Course. ECG/medicine tests: ordered and independent interpretation performed. Decision-making details documented in ED Course.  Risk Prescription drug management. Parenteral controlled substances.    Final Clinical Impression(s) / ED Diagnoses Final diagnoses:  Nausea vomiting and diarrhea  Generalized abdominal pain  Hypokalemia    Rx / DC Orders ED Discharge Orders          Ordered    ondansetron (ZOFRAN-ODT) 4 MG disintegrating tablet  Every 8 hours PRN        05/08/22 0702    dicyclomine (BENTYL) 20 MG tablet  2 times daily        05/08/22 0702  Pollyann Savoy, MD 05/08/22 [phone]

## 2022-05-08 NOTE — ED Notes (Signed)
Pt was able to ambulate to the bathroom to w/o any assistance.

## 2022-05-08 NOTE — ED Provider Notes (Signed)
[facility] Provider Note   CSN: [id number] Arrival date & time: 05/08/22  1538     History  Chief Complaint  Patient presents with   Abdominal Pain   Diarrhea   Emesis    Rebecca Gardner is a 26 y.o. female present emergency department with abdominal pain, diarrhea.  The patient was seen earlier today with the same symptoms, and a CT scan performed as well as blood work, which did not show any acute explanation for her symptoms, although her potassium was somewhat low and this was repleted.  She was discharged home with Bentyl, Zofran, and reports that she is now having worsening diarrhea, says that her stool is "water just running out of me".  She reports he continues to have sharp pain in her abdomen particularly in her back.  She is concerned because she reports she has lost nearly 50 pounds in the past month.  She was given a referral to gastroenterology but says that [facility] gastroenterology is outside of her insurance network, she would not be able to see them.  HPI     Home Medications Prior to Admission medications   Medication Sig Start Date End Date Taking? Authorizing Provider  acetaminophen (TYLENOL) 325 MG tablet Take 650 mg by mouth every 6 (six) hours as needed for headache.   Yes [provider]  dicyclomine (BENTYL) 20 MG tablet Take 1 tablet (20 mg total) by mouth 2 (two) times daily. 05/08/22  Yes Truddie Hidden, MD  hydrochlorothiazide (HYDRODIURIL) 12.5 MG tablet Take 1 tablet (12.5 mg total) by mouth daily. 04/17/22  Yes Evelina Dun A, FNP  norgestimate-ethinyl estradiol (ORTHO-CYCLEN) 0.25-35 MG-MCG tablet TAKE 1 TABLET BY MOUTH EVERY DAY 02/18/22  Yes Hawks, Christy A, FNP  omeprazole (PRILOSEC) 20 MG capsule Take 1 capsule (20 mg total) by mouth daily. Patient taking differently: Take 20 mg by mouth 2 (two) times daily before a meal. 03/05/22  Yes Hawks, Christy A, FNP  ondansetron (ZOFRAN-ODT) 4 MG disintegrating tablet  Take 1 tablet (4 mg total) by mouth every 8 (eight) hours as needed for nausea or vomiting. 05/08/22  Yes Truddie Hidden, MD  SUMAtriptan (IMITREX) 100 MG tablet Take 1 tablet (100 mg total) by mouth every 2 (two) hours as needed for migraine. May repeat in 2 hours if headache persists or recurs. 04/17/22  Yes Hawks, Christy A, FNP  topiramate (TOPAMAX) 50 MG tablet Take 1 tablet (50 mg total) by mouth 2 (two) times daily. 05/05/22  Yes Hawks, Christy A, FNP  clobetasol cream (TEMOVATE) [id number] % Apply 1 application topically 2 (two) times daily. Patient not taking: Reported on 05/08/2022 09/12/20   Evelina Dun A, FNP  potassium chloride SA (KLOR-CON M) 20 MEQ tablet Take 1 tablet (20 mEq total) by mouth daily. 05/08/22   Sherwood Gambler, MD  Semaglutide-Weight Management (WEGOVY) 0.5 MG/0.5ML SOAJ Inject 0.5 mg into the skin once a week. 05/05/22   Sharion Balloon, FNP      Allergies    Patient has no known allergies.    Review of Systems   Review of Systems  Physical Exam Updated Vital Signs BP 133/76 (BP Location: Left Arm)   Pulse 98   Temp 100.1 F (37.8 C) (Oral)   Resp 20   Wt (!) 149 kg   LMP 04/11/2022 (Approximate)   SpO2 99%   BMI 49.95 kg/m  Physical Exam Constitutional:      General: She is not in acute distress.  Appearance: She is obese.  HENT:     Head: Normocephalic and atraumatic.  Eyes:     Conjunctiva/sclera: Conjunctivae normal.     Pupils: Pupils are equal, round, and reactive to light.  Cardiovascular:     Rate and Rhythm: Normal rate and regular rhythm.  Pulmonary:     Effort: Pulmonary effort is normal. No respiratory distress.  Abdominal:     General: There is no distension.  Skin:    General: Skin is warm and dry.  Neurological:     General: No focal deficit present.     Mental Status: She is alert. Mental status is at baseline.  Psychiatric:        Mood and Affect: Mood normal.        Behavior: Behavior normal.     ED Results /  Procedures / Treatments   Labs (all labs ordered are listed, but only abnormal results are displayed) Labs Reviewed  LIPASE, BLOOD - Abnormal; Notable for the following components:      Result Value   Lipase 55 (*)    All other components within normal limits  COMPREHENSIVE METABOLIC PANEL - Abnormal; Notable for the following components:   Potassium 3.0 (*)    CO2 20 (*)    Calcium 8.5 (*)    AST 61 (*)    ALT 53 (*)    All other components within normal limits  CBC - Abnormal; Notable for the following components:   RBC 5.45 (*)    MCV 78.9 (*)    MCH 25.9 (*)    All other components within normal limits  URINALYSIS, ROUTINE W REFLEX MICROSCOPIC - Abnormal; Notable for the following components:   Specific Gravity, Urine 1.036 (*)    Hgb urine dipstick MODERATE (*)    Protein, ur 30 (*)    Bacteria, UA RARE (*)    All other components within normal limits  PHOSPHORUS - Abnormal; Notable for the following components:   Phosphorus 1.9 (*)    All other components within normal limits  C DIFFICILE QUICK SCREEN W PCR REFLEX    RESP PANEL BY RT-PCR (FLU A&B, COVID) [id number]  GASTROINTESTINAL PANEL BY PCR, STOOL (REPLACES STOOL CULTURE)  PREGNANCY, URINE  MAGNESIUM  COMPREHENSIVE METABOLIC PANEL  CBC WITH DIFFERENTIAL/PLATELET  HEPATITIS PANEL, ACUTE  HIV ANTIBODY (ROUTINE TESTING W REFLEX)  CBC  CREATININE, SERUM  MAGNESIUM  PHOSPHORUS  CBC WITH DIFFERENTIAL/PLATELET    EKG None  Radiology CT Abdomen Pelvis W Contrast  Result Date: 05/08/2022 CLINICAL DATA:  26 year old female with history of right lower quadrant abdominal pain, nausea, vomiting and diarrhea. EXAM: CT ABDOMEN AND PELVIS WITH CONTRAST TECHNIQUE: Multidetector CT imaging of the abdomen and pelvis was performed using the standard protocol following bolus administration of intravenous contrast. RADIATION DOSE REDUCTION: This exam was performed according to the departmental dose-optimization program which  includes automated exposure control, adjustment of the mA and/or kV according to patient size and/or use of iterative reconstruction technique. CONTRAST:  159mL OMNIPAQUE IOHEXOL 300 MG/ML  SOLN COMPARISON:  CT of the abdomen and pelvis 07/30/2018. FINDINGS: Lower chest: Unremarkable. Hepatobiliary: Diffuse low attenuation throughout the hepatic parenchyma, indicative of a background of hepatic steatosis. No suspicious cystic or solid hepatic lesions. Status post cholecystectomy. Pancreas: No pancreatic mass. No pancreatic ductal dilatation. No pancreatic or peripancreatic fluid collections or inflammatory changes. Spleen: Unremarkable. Adrenals/Urinary Tract: Nonobstructive calculi are noted within the collecting systems of both kidneys measuring up to 6 mm in the interpolar collecting  system of the left kidney. No calculi are noted along the course of either ureter or within the lumen of the urinary bladder. No hydroureteronephrosis. Mild multifocal cortical thinning in the kidneys bilaterally. No suspicious renal lesions. Bilateral adrenal glands are normal in appearance. Stomach/Bowel: The appearance of the stomach is unremarkable. No pathologic dilatation of small bowel or colon. Normal appendix. Vascular/Lymphatic: No significant atherosclerotic disease, aneurysm or dissection noted in the abdominal or pelvic vasculature. No lymphadenopathy noted in the abdomen or pelvis. Reproductive: Uterus and ovaries are unremarkable in appearance. Other: No significant volume of ascites.  No pneumoperitoneum. Musculoskeletal: There are no aggressive appearing lytic or blastic lesions noted in the visualized portions of the skeleton. IMPRESSION: 1. No acute findings are noted in the abdomen or pelvis to account for the patient's symptoms. 2. Nonobstructive calculi are present within the renal collecting systems bilaterally (left-greater-than-right), largest of which measures up to 6 mm in the interpolar collecting system  of the left kidney. No ureteral stones or findings of urinary tract obstruction are noted at this time. 3. Hepatic steatosis. Electronically Signed   By: Trudie Reed M.D.   On: 05/08/2022 06:53    Procedures Procedures    Medications Ordered in ED Medications  ketorolac (TORADOL) 30 MG/ML injection 30 mg (30 mg Intravenous Patient Refused/Not Given 05/08/22 1728)  0.9 % NaCl with KCl 20 mEq/ L  infusion ( Intravenous New Bag/Given 05/08/22 2159)  HYDROmorphone (DILAUDID) injection 1 mg (1 mg Intravenous Given 05/08/22 2158)  topiramate (TOPAMAX) tablet 50 mg (50 mg Oral Given 05/08/22 2300)  enoxaparin (LOVENOX) injection 40 mg (has no administration in time range)  senna-docusate (Senokot-S) tablet 1 tablet (has no administration in time range)  ondansetron (ZOFRAN) tablet 4 mg (has no administration in time range)    Or  ondansetron (ZOFRAN) injection 4 mg (has no administration in time range)  ondansetron (ZOFRAN) injection 4 mg (4 mg Intravenous Given 05/08/22 1614)  potassium chloride 10 mEq in 100 mL IVPB (0 mEq Intravenous Stopped 05/08/22 1835)  morphine (PF) 4 MG/ML injection 4 mg (4 mg Intravenous Given 05/08/22 1719)  sodium chloride 0.9 % bolus 1,000 mL (0 mLs Intravenous Stopped 05/08/22 2006)    ED Course/ Medical Decision Making/ A&P Clinical Course as of 05/08/22 2305  Fri May 08, 2022  1744 Admitted to Dr Deno Etienne hospitalist [MT]    Clinical Course User Index [MT] Renaye Rakers Kermit Balo, MD                           Medical Decision Making Amount and/or Complexity of Data Reviewed Labs: ordered.  Risk Prescription drug management. Decision regarding hospitalization.   This patient presents to the Emergency Department with complaint of abdominal pain. This involves an extensive number of treatment options, and is a complaint that carries with it a high risk of complications and morbidity.  The differential diagnosis includes, but is not limited to, gastritis vs biliary  disease vs peptic ulcer vs constipation vs colitis vs UTI vs other  Because the patient had an unremarkable CT performed only a few hours ago I do not see an indication for repeat scanning at this time.  We can recheck her potassium level as well as her white blood cell count.  It is not clear what the etiology of her symptoms are, but I think an infectious stool study including C. difficile would be reasonable at this time, given the quantity of her diarrhea, as  well as her cramping abdominal pain.  I have also ordered potassium repletion as her potassium continues to be on the lower side and she is having diarrhea.  IV morphine ordered for abdominal pain.  At this point she is not able to achieve pain control at home, or control her diarrhea, and is at risk for dehydration and further electrolyte imbalances.  We discussed medical admission and she is in agreement.  I have a lower suspicion otherwise for ovarian torsion, appendicitis, or other acute surgical emergency.           Final Clinical Impression(s) / ED Diagnoses Final diagnoses:  Abdominal pain, unspecified abdominal location  Diarrhea, unspecified type  Hypokalemia    Rx / DC Orders ED Discharge Orders     None         Wyvonnia Dusky, MD 05/08/22 2305

## 2022-05-08 NOTE — ED Notes (Signed)
RN informed provider that pt c/o increased nausea/pain upon return from CT.

## 2022-05-08 NOTE — ED Provider Notes (Signed)
Care transferred to me.  Patient's feeling better and was able to tolerate p.o.  Will give a short course of potassium at home.  Otherwise appears stable for discharge.  She states this is the second episode to happen in the last month or so and wonders if there is something else going on.  She would like a referral to GI and has been supplied with 1 to [facility] gastroenterology.   Pricilla Loveless, MD 05/08/22 [phone]

## 2022-05-08 NOTE — Discharge Instructions (Addendum)
If you develop worsening, continued, or recurrent abdominal pain, uncontrolled vomiting, fever, chest or back pain, or any other new/concerning symptoms then return to the ER for evaluation.   Your potassium is mildly low today, likely from all the vomiting and diarrhea. We are giving you a 3 day course to replete this.

## 2022-05-08 NOTE — ED Triage Notes (Signed)
Pt abd pain radiates to back, and diarrhea and vomitting. Pt was seen here this morning and discharged home with bentyl but unable to keep it down. Pain is worse on right side of abd and radiates to lower back. Pt states she has lost 50 lbs in 1 month.

## 2022-05-08 NOTE — ED Triage Notes (Signed)
POV, pt sts that yesterday NVD and lower abd pain started, last night pain got worse and it now bilateral lower abd and lower back. Alert and oriented x 4. Amb to triage.

## 2022-05-08 NOTE — H&P (Incomplete)
PCP:   Sharion Balloon, FNP   Chief Complaint:  Abdominal pain  HPI: This is a 26 year old female with past medical history significant for depression/hypertension.  Per patient on Thursday morning around 4:30 AM she woke and had to rush to the bathroom because she had nausea, vomiting and diarrhea.  Per patient everything looks like water.  She states it looks as though urine was coming out of her body instead of poop.  For the first 24 hours she had emesis almost every 30 minutes.  Since then her emesis had decreased infrequently but occurs every few hours.  Today she woke she has searing, stabbing pain in her stomach radiating to her back.  She went to [facility] where workup was done, revealed nothing.  She discharged with medications home.  Around 2 PM she woke up again with severe back pain.  She decided to go to [facility] ER.  The patient denies fever, chills, burning urination, shortness of breath, wheezing or cough.  Patient denies any hematuria or emesis.  She denies any recent travel.  She has not been eating out.  Her husband and family members have not been ill.  She was transferred to [facility]  In the ER CT abdomen pelvis is nonrevealing. Nonobstructive calculi are present within the renal collecting systems bilaterally (left-greater-than-right), largest of which measures up to 6 mm in the interpolar collecting system of the left kidney. No ureteral stones or findings of urinary tract obstruction noted.  LFTs are slightly elevated AST 61/ALT 53 alk phos normal at 123, lipase slight elevated 55.  The patient was sent to [facility]   Review of Systems:  The patient denies anorexia, fever, weight loss,, vision loss, decreased hearing, hoarseness, chest pain, syncope, dyspnea on exertion, peripheral edema, balance deficits, hemoptysis, abdominal pain, melena, hematochezia, severe indigestion/heartburn, hematuria, incontinence, genital sores, muscle weakness, suspicious skin lesions, transient  blindness, difficulty walking, depression, unusual weight change, abnormal bleeding, enlarged lymph nodes, angioedema, and breast masses. Positives: Abdominal pain, nausea, vomiting, diarrhea  Past Medical History: Past Medical History:  Diagnosis Date   Acute lateral meniscus tear of left knee 02/02/2014   Acute medial meniscus tear of left knee    Concussion with brief (less than one hour) loss of consciousness 4-28 15   Depression    Gall stones 03/27/2016   Hypertension    Nephrolithiasis    Past Surgical History:  Procedure Laterality Date   CHOLECYSTECTOMY N/A 04/10/2016   Procedure: LAPAROSCOPIC CHOLECYSTECTOMY;  Surgeon: Mickeal Skinner, MD;  Location: [facility];  Service: General;  Laterality: N/A;   KNEE ARTHROSCOPY WITH LATERAL MENISECTOMY Left 02/02/2014   Procedure: LEFT KNEE ARTHROSCOPY WITH LATERAL MENISECTOMY;  Surgeon: Johnny Bridge, MD;  Location: [place];  Service: Orthopedics;  Laterality: Left;   TONSILLECTOMY      Medications: Prior to Admission medications   Medication Sig Start Date End Date Taking? Authorizing Provider  acetaminophen (TYLENOL) 325 MG tablet Take 650 mg by mouth every 6 (six) hours as needed for headache.    [provider]  clobetasol cream (TEMOVATE) 9.41 % Apply 1 application topically 2 (two) times daily. 09/12/20   Sharion Balloon, FNP  dicyclomine (BENTYL) 20 MG tablet Take 1 tablet (20 mg total) by mouth 2 (two) times daily. 05/08/22   Truddie Hidden, MD  hydrochlorothiazide (HYDRODIURIL) 12.5 MG tablet Take 1 tablet (12.5 mg total) by mouth daily. 04/17/22   Sharion Balloon, FNP  norgestimate-ethinyl estradiol (ORTHO-CYCLEN)  0.25-35 MG-MCG tablet TAKE 1 TABLET BY MOUTH EVERY DAY 02/18/22   Evelina Dun A, FNP  omeprazole (PRILOSEC) 20 MG capsule Take 1 capsule (20 mg total) by mouth daily. 03/05/22   Evelina Dun A, FNP  ondansetron (ZOFRAN-ODT) 4 MG disintegrating tablet Take 1 tablet (4 mg total) by  mouth every 8 (eight) hours as needed for nausea or vomiting. 05/08/22   Truddie Hidden, MD  potassium chloride SA (KLOR-CON M) 20 MEQ tablet Take 1 tablet (20 mEq total) by mouth daily. 05/08/22   Sherwood Gambler, MD  Semaglutide-Weight Management (WEGOVY) 0.5 MG/0.5ML SOAJ Inject 0.5 mg into the skin once a week. 05/05/22   Sharion Balloon, FNP  SUMAtriptan (IMITREX) 100 MG tablet Take 1 tablet (100 mg total) by mouth every 2 (two) hours as needed for migraine. May repeat in 2 hours if headache persists or recurs. 04/17/22   Sharion Balloon, FNP  topiramate (TOPAMAX) 50 MG tablet Take 1 tablet (50 mg total) by mouth 2 (two) times daily. 05/05/22   Sharion Balloon, FNP    Allergies:  No Known Allergies  Social History:  reports that she has never smoked. She has never used smokeless tobacco. She reports that she does not drink alcohol and does not use drugs.  Family History: Family History  Problem Relation Age of Onset   Breast cancer Paternal Grandmother    Healthy Mother    Atrial fibrillation Father    Prostate cancer Paternal Grandfather    Cancer Paternal Grandfather        liver   Migraines Maternal Uncle    COPD Maternal Uncle    Jaundice Daughter     Physical Exam: Vitals:   05/08/22 1727 05/08/22 1730 05/08/22 1845 05/08/22 2121  BP: 125/72 137/84  133/76  Pulse: 92 100  98  Resp: 19 (!) 22  20  Temp:   98.7 F (37.1 C) 100.1 F (37.8 C)  TempSrc:   Oral Oral  SpO2: 99% 100%  99%  Weight:        General:  Alert and oriented times three, extreme morbid obesity, no acute distress Eyes: PERRLA, pink conjunctiva, no scleral icterus ENT: Moist oral mucosa, neck supple, no thyromegaly Lungs: clear to ascultation, no wheeze, no crackles, no use of accessory muscles Cardiovascular: regular rate and rhythm, no regurgitation, no gallops, no murmurs. No carotid bruits, no JVD Abdomen: soft, positive BS, mild TTP RUQ, non-distended, no organomegaly, not an acute  abdomen GU: not examined Neuro: CN II - XII grossly intact, sensation intact Musculoskeletal: strength 5/5 all extremities, no clubbing, cyanosis or edema Skin: no rash, no subcutaneous crepitation, no decubitus Psych: appropriate patient   Labs on Admission:  Recent Labs    05/08/22 0410 05/08/22 1616  NA 134* 135  K 3.1* 3.0*  CL 105 105  CO2 17* 20*  GLUCOSE 108* 96  BUN 10 8  CREATININE 0.90 0.84  CALCIUM 8.5* 8.5*   Recent Labs    05/08/22 0410 05/08/22 1616  AST 30 61*  ALT 26 53*  ALKPHOS 60 123  BILITOT 0.3 0.3  PROT 7.6 7.7  ALBUMIN 4.0 4.0   Recent Labs    05/08/22 0410 05/08/22 1616  LIPASE 16 55*   Recent Labs    05/08/22 0410 05/08/22 1616  WBC 11.1* 7.0  NEUTROABS 7.5  --   HGB 14.2 14.1  HCT 42.3 43.0  MCV 77.9* 78.9*  PLT 322 314     Micro Results: Recent  Results (from the past 240 hour(s))  Resp Panel by RT-PCR (Flu A&B, Covid) Anterior Nasal Swab     Status: None   Collection Time: 05/08/22  6:28 PM   Specimen: Anterior Nasal Swab  Result Value Ref Range Status   SARS Coronavirus 2 by RT PCR NEGATIVE NEGATIVE Final    Comment: (NOTE) SARS-CoV-2 target nucleic acids are NOT DETECTED.  The SARS-CoV-2 RNA is generally detectable in upper respiratory specimens during the acute phase of infection. The lowest concentration of SARS-CoV-2 viral copies this assay can detect is 138 copies/mL. A negative result does not preclude SARS-Cov-2 infection and should not be used as the sole basis for treatment or other patient management decisions. A negative result may occur with  improper specimen collection/handling, submission of specimen other than nasopharyngeal swab, presence of viral mutation(s) within the areas targeted by this assay, and inadequate number of viral copies(<138 copies/mL). A negative result must be combined with clinical observations, patient history, and epidemiological information. The expected result is  Negative.  Fact Sheet for Patients:  EntrepreneurPulse.com.au  Fact Sheet for Healthcare Providers:  IncredibleEmployment.be  This test is no t yet approved or cleared by the [place] FDA and  has been authorized for detection and/or diagnosis of SARS-CoV-2 by FDA under an Emergency Use Authorization (EUA). This EUA will remain  in effect (meaning this test can be used) for the duration of the COVID-19 declaration under Section 564(b)(1) of the Act, 21 U.S.C.section 360bbb-3(b)(1), unless the authorization is terminated  or revoked sooner.       Influenza A by PCR NEGATIVE NEGATIVE Final   Influenza B by PCR NEGATIVE NEGATIVE Final    Comment: (NOTE) The Xpert Xpress SARS-CoV-2/FLU/RSV plus assay is intended as an aid in the diagnosis of influenza from Nasopharyngeal swab specimens and should not be used as a sole basis for treatment. Nasal washings and aspirates are unacceptable for Xpert Xpress SARS-CoV-2/FLU/RSV testing.  Fact Sheet for Patients: EntrepreneurPulse.com.au  Fact Sheet for Healthcare Providers: IncredibleEmployment.be  This test is not yet approved or cleared by the [place] FDA and has been authorized for detection and/or diagnosis of SARS-CoV-2 by FDA under an Emergency Use Authorization (EUA). This EUA will remain in effect (meaning this test can be used) for the duration of the COVID-19 declaration under Section 564(b)(1) of the Act, 21 U.S.C. section 360bbb-3(b)(1), unless the authorization is terminated or revoked.  Performed at [facility], [address], [place], [place] [address]      Radiological Exams on Admission: CT Abdomen Pelvis W Contrast  Result Date: 05/08/2022 CLINICAL DATA:  26 year old female with history of right lower quadrant abdominal pain, nausea, vomiting and diarrhea. EXAM: CT ABDOMEN AND PELVIS WITH CONTRAST TECHNIQUE:  Multidetector CT imaging of the abdomen and pelvis was performed using the standard protocol following bolus administration of intravenous contrast. RADIATION DOSE REDUCTION: This exam was performed according to the departmental dose-optimization program which includes automated exposure control, adjustment of the mA and/or kV according to patient size and/or use of iterative reconstruction technique. CONTRAST:  152m OMNIPAQUE IOHEXOL 300 MG/ML  SOLN COMPARISON:  CT of the abdomen and pelvis 07/30/2018. FINDINGS: Lower chest: Unremarkable. Hepatobiliary: Diffuse low attenuation throughout the hepatic parenchyma, indicative of a background of hepatic steatosis. No suspicious cystic or solid hepatic lesions. Status post cholecystectomy. Pancreas: No pancreatic mass. No pancreatic ductal dilatation. No pancreatic or peripancreatic fluid collections or inflammatory changes. Spleen: Unremarkable. Adrenals/Urinary Tract: Nonobstructive calculi are noted within the collecting systems  of both kidneys measuring up to 6 mm in the interpolar collecting system of the left kidney. No calculi are noted along the course of either ureter or within the lumen of the urinary bladder. No hydroureteronephrosis. Mild multifocal cortical thinning in the kidneys bilaterally. No suspicious renal lesions. Bilateral adrenal glands are normal in appearance. Stomach/Bowel: The appearance of the stomach is unremarkable. No pathologic dilatation of small bowel or colon. Normal appendix. Vascular/Lymphatic: No significant atherosclerotic disease, aneurysm or dissection noted in the abdominal or pelvic vasculature. No lymphadenopathy noted in the abdomen or pelvis. Reproductive: Uterus and ovaries are unremarkable in appearance. Other: No significant volume of ascites.  No pneumoperitoneum. Musculoskeletal: There are no aggressive appearing lytic or blastic lesions noted in the visualized portions of the skeleton. IMPRESSION: 1. No acute findings  are noted in the abdomen or pelvis to account for the patient's symptoms. 2. Nonobstructive calculi are present within the renal collecting systems bilaterally (left-greater-than-right), largest of which measures up to 6 mm in the interpolar collecting system of the left kidney. No ureteral stones or findings of urinary tract obstruction are noted at this time. 3. Hepatic steatosis. Electronically Signed   By: Vinnie Langton M.D.   On: 05/08/2022 06:53    Assessment/Plan Present on Admission:  Gastroenteritis/abdominal pain -Brief overnight observation -IV fluid hydration -Given patient's mildly elevated LFTs we will order hepatitis panel -Follow-up CMP in the a.m. -PRN Pain medications -T. bili normal -C. difficile negative -Conservative treatment -Patient with hepatic steatosis   Hypokalemia -repleting IV -BMP in a.m.   Migraine -Topamax resumed   GAD (generalized anxiety disorder)  Depression, major, single episode, mild ([place]) -  Peyson Postema 05/08/2022, 9:40 PM

## 2022-05-09 ENCOUNTER — Encounter (HOSPITAL_COMMUNITY): Payer: Self-pay | Admitting: Family Medicine

## 2022-05-09 DIAGNOSIS — I10 Essential (primary) hypertension: Secondary | ICD-10-CM | POA: Diagnosis not present

## 2022-05-09 DIAGNOSIS — R197 Diarrhea, unspecified: Secondary | ICD-10-CM | POA: Diagnosis not present

## 2022-05-09 DIAGNOSIS — R7401 Elevation of levels of liver transaminase levels: Secondary | ICD-10-CM | POA: Diagnosis not present

## 2022-05-09 DIAGNOSIS — A0839 Other viral enteritis: Secondary | ICD-10-CM | POA: Diagnosis not present

## 2022-05-09 DIAGNOSIS — R112 Nausea with vomiting, unspecified: Secondary | ICD-10-CM | POA: Diagnosis not present

## 2022-05-09 DIAGNOSIS — Z1152 Encounter for screening for COVID-19: Secondary | ICD-10-CM | POA: Diagnosis not present

## 2022-05-09 DIAGNOSIS — Z7985 Long-term (current) use of injectable non-insulin antidiabetic drugs: Secondary | ICD-10-CM | POA: Diagnosis not present

## 2022-05-09 DIAGNOSIS — Z79899 Other long term (current) drug therapy: Secondary | ICD-10-CM | POA: Diagnosis not present

## 2022-05-09 DIAGNOSIS — E876 Hypokalemia: Secondary | ICD-10-CM | POA: Diagnosis not present

## 2022-05-09 LAB — GASTROINTESTINAL PANEL BY PCR, STOOL (REPLACES STOOL CULTURE)

## 2022-05-09 LAB — CBC WITH DIFFERENTIAL/PLATELET
Abs Immature Granulocytes: 0.02 10*3/uL (ref 0.00–0.07)
Basophils Absolute: 0 10*3/uL (ref 0.0–0.1)
Basophils Relative: 0 %
Eosinophils Absolute: 0.3 10*3/uL (ref 0.0–0.5)
Eosinophils Relative: 4 %
HCT: 38.1 % (ref 36.0–46.0)
Hemoglobin: 12.2 g/dL (ref 12.0–15.0)
Immature Granulocytes: 0 %
Lymphocytes Relative: 28 %
Lymphs Abs: 1.9 10*3/uL (ref 0.7–4.0)
MCH: 26 pg (ref 26.0–34.0)
MCHC: 32 g/dL (ref 30.0–36.0)
MCV: 81.2 fL (ref 80.0–100.0)
Monocytes Absolute: 0.7 10*3/uL (ref 0.1–1.0)
Monocytes Relative: 11 %
Neutro Abs: 3.8 10*3/uL (ref 1.7–7.7)
Neutrophils Relative %: 57 %
Platelets: 260 10*3/uL (ref 150–400)
RBC: 4.69 MIL/uL (ref 3.87–5.11)
RDW: 14.3 % (ref 11.5–15.5)
WBC: 6.7 10*3/uL (ref 4.0–10.5)
nRBC: 0 % (ref 0.0–0.2)

## 2022-05-09 LAB — COMPREHENSIVE METABOLIC PANEL
ALT: 61 U/L — ABNORMAL HIGH (ref 0–44)
AST: 73 U/L — ABNORMAL HIGH (ref 15–41)
Albumin: 3.1 g/dL — ABNORMAL LOW (ref 3.5–5.0)
Alkaline Phosphatase: 135 U/L — ABNORMAL HIGH (ref 38–126)
Anion gap: 7 (ref 5–15)
BUN: 7 mg/dL (ref 6–20)
CO2: 18 mmol/L — ABNORMAL LOW (ref 22–32)
Calcium: 7.9 mg/dL — ABNORMAL LOW (ref 8.9–10.3)
Chloride: 112 mmol/L — ABNORMAL HIGH (ref 98–111)
Creatinine, Ser: 0.72 mg/dL (ref 0.44–1.00)
GFR, Estimated: >60 mL/min (ref >60)
Glucose, Bld: 109 mg/dL — ABNORMAL HIGH (ref 70–99)
Potassium: 2.8 mmol/L — ABNORMAL LOW (ref 3.5–5.1)
Sodium: 137 mmol/L (ref 135–145)
Total Bilirubin: 0.3 mg/dL (ref 0.3–1.2)
Total Protein: 6.9 g/dL (ref 6.5–8.1)

## 2022-05-09 LAB — HEPATITIS PANEL, ACUTE
HCV Ab: NONREACTIVE
Hep A IgM: NONREACTIVE
Hep B C IgM: NONREACTIVE
Hepatitis B Surface Ag: NONREACTIVE

## 2022-05-09 LAB — MAGNESIUM: Magnesium: 1.9 mg/dL (ref 1.7–2.4)

## 2022-05-09 LAB — HIV ANTIBODY (ROUTINE TESTING W REFLEX): HIV Screen 4th Generation wRfx: NONREACTIVE

## 2022-05-09 LAB — LIPASE, BLOOD: Lipase: 55 U/L — ABNORMAL HIGH (ref 11–51)

## 2022-05-09 LAB — PHOSPHORUS: Phosphorus: 2 mg/dL — ABNORMAL LOW (ref 2.5–4.6)

## 2022-05-09 MED ORDER — POTASSIUM PHOSPHATES 15 MMOLE/5ML IV SOLN
30.0000 mmol | Freq: Once | INTRAVENOUS | Status: AC
  Administered 2022-05-09: 30 mmol via INTRAVENOUS
  Filled 2022-05-09: qty 10

## 2022-05-09 NOTE — Progress Notes (Signed)
PROGRESS NOTE  Rebecca Gardner  DOB: 21-Feb-1996  PCP: Junie Spencer, FNP ZOX:[id number]  DOA: 05/08/2022  LOS: 0 days  Hospital Day: 2  Brief narrative: Rebecca Gardner is a 26 y.o. female with PMH significant for morbid obesity, HTN, depression, migraine. 11/30, patient woke up early in the morning with nausea, vomiting, diarrhea.  Symptoms increased in severity and lasted for 24 hours. 12/1, patient woke up early in the morning with stabbing abdominal pain radiating to her back.  She presented to ED at [facility].   Initial workup showed potassium low at 3.1, WBC count 11.1, urine pregnancy test negative, urinalysis with hazy yellow urine, negative for infection. CT scan of abdomen did not show any acute intra-abdominal or intrapelvic pathology to account for her symptoms.  She was given IV fluid, electrolyte replacement, IV Zofran, IV morphine.  Her symptoms improved and she was discharged to home on Bentyl.   Around 2 PM in the afternoon, she had another episode of sudden abdominal pain radiating to back and returned back to the ED.  In the ED, she had a temperature of 100.2, tachycardic to 120, blood pressure in 140s, breathing on room air. Labs showed potassium low at 3, mildly elevated AST, ALT and lipase, WBC count normal Respiratory virus panel negative for COVID, flu Urinalysis did not show evidence of infection Stool assay negative for C diff.  Pending GI pathogen panel Admitted to hospitalist service for further evaluation and management.   Subjective: Patient was seen and examined this morning.  Pleasant young Caucasian female.  Lying on bed. Reports multiple episodes of watery diarrhea last night and this morning.  Father at bedside. Chart reviewed Repeat labs this morning showed potassium at 2.8, serum bicarb low at 18, renal function normal, phosphorus low at 2.  Assessment and plan: Acute gastroenteritis Presented with sudden onset nausea, vomiting, diarrhea followed by  abdominal pain. Unclear etiology.  Infectious versus noninfectious.  Patient denies eating out or having sick contacts.  C. difficile assay negative.  Pending GI pathogen panel Continue IV hydration, IV antiemetics as needed, IV pain meds as needed  Hypokalemia/hypophosphatemia Levels low this morning as below.  Secondary to GI loss as well as use of diuretics.  Replacement ordered Recent Labs  Lab 05/08/22 0410 05/08/22 1616 05/08/22 2150 05/09/22 0511  K 3.1* 3.0*  --  2.8*  MG  --   --  1.8 1.9  PHOS  --   --  1.9* 2.0*   Elevated liver enzymes or lipase Hepatic steatosis Mild gradual elevation liver enzymes and lipase as below.  CT abdomen without evidence of pancreatitis.  Showed hepatic steatosis. Obtain acute hepatitis panel Recent Labs  Lab 05/08/22 0410 05/08/22 1616 05/09/22 0511  AST 30 61* 73*  ALT 26 53* 61*  ALKPHOS 60 123 135*  BILITOT 0.3 0.3 0.3  PROT 7.6 7.7 6.9  ALBUMIN 4.0 4.0 3.1*  LIPASE 16 55* 55*  PLT 322 314 260      Latest Ref Rng & Units 05/08/2022    9:50 PM  Hepatitis  Hep B Surface Ag NON REACTIVE NON REACTIVE   Hep B IgM NON REACTIVE NON REACTIVE   Hep C Ab NON REACTIVE NON REACTIVE   Hep A IgM NON REACTIVE NON REACTIVE    Bilateral nephrolithiasis CT abdomen showed nonobstructive calculi present in both renal collecting systems L>R, largest up to 6 mm.  No evidence of ureteral stones or UTI. Unlikely to be the cause of acute  symptoms at this time. Follow-up with urology as an outpatient  Essential hypertension PTA on HCTZ 12.5 mg daily Will keep it on hold while she is having fluid loss.  Morbid obesity  Body mass index is 49.95 kg/m.  It seems patient has been trying to intentionally lose weight. Apparently she has lost 50 pounds in 1 month. 11/28, she was prescribed Wegovy (semaglutide) 0.5 mg weekly by her family physician.  Patient states she has not started to take it this morning. Patient has been advised to make an  attempt to improve diet and exercise patterns to aid in weight loss.  Migraine Continue Topamax  Goals of care   Code Status: Full Code    Mobility: Encourage ambulation  Infusions:   0.9 % NaCl with KCl 20 mEq / L 125 mL/hr at 05/09/22 0419   potassium PHOSPHATE IVPB (in mmol) 30 mmol (05/09/22 1038)    Scheduled Meds:  enoxaparin (LOVENOX) injection  40 mg Subcutaneous Q24H   ketorolac  30 mg Intravenous Once   topiramate  50 mg Oral BID    PRN meds: HYDROmorphone (DILAUDID) injection, ondansetron **OR** ondansetron (ZOFRAN) IV, senna-docusate   Skin assessment:     Nutritional status:  Body mass index is 49.95 kg/m.          Diet:  Diet Order             Diet Heart Room service appropriate? Yes; Fluid consistency: Thin  Diet effective now                   DVT prophylaxis:  enoxaparin (LOVENOX) injection 40 mg Start: 05/09/22 1000 SCDs Start: 05/08/22 2247   Antimicrobials: None Fluid: Normal saline at 125 mill per hour Consultants: None Family Communication: Father at bedside  Status is: Observation  Continue in-hospital care because: Continues to have diarrhea Level of care: Med-Surg   Dispo: The patient is from: Home              Anticipated d/c is to: Hopefully home in 1 to 2 days              Patient currently is not medically stable to d/c.   Difficult to place patient No       Antimicrobials: Anti-infectives (From admission, onward)    None       Objective: Vitals:   05/09/22 0928 05/09/22 1402  BP: 123/80 118/79  Pulse: 83 86  Resp: 16   Temp: 99.7 F (37.6 C) 98.6 F (37 C)  SpO2: 100% 100%    Intake/Output Summary (Last 24 hours) at 05/09/2022 1428 Last data filed at 05/09/2022 1000 Gross per 24 hour  Intake 2410.03 ml  Output 700 ml  Net 1710.03 ml   Filed Weights   05/08/22 1606  Weight: (!) 149 kg   Weight change:  Body mass index is 49.95 kg/m.   Physical Exam: General exam: Pleasant, morbidly  obese young female. Skin: No rashes, lesions or ulcers. HEENT: Atraumatic, normocephalic, no obvious bleeding Lungs: Clear to auscultation bilaterally CVS: Regular rate and rhythm, no murmur GI/Abd soft, nondistended, mild diffuse tenderness, bowel sound present CNS: Alert, awake, oriented x 3 Psychiatry: Mood appropriate Extremities: No pedal edema, no calf tenderness  Data Review: I have personally reviewed the laboratory data and studies available.  F/u labs ordered Unresulted Labs (From admission, onward)     Start     Ordered   05/15/22 0500  Creatinine, serum  (enoxaparin (LOVENOX)  CrCl >/= 30 ml/min)  Weekly,   R     Comments: while on enoxaparin therapy    05/08/22 2248   05/10/22 0500  Basic metabolic panel  Tomorrow morning,   R        05/09/22 0858   05/10/22 0500  CBC with Differential/Platelet  Tomorrow morning,   R        05/09/22 0858   05/10/22 0500  Magnesium  Tomorrow morning,   R        05/09/22 0858   05/10/22 0500  Phosphorus  Tomorrow morning,   R        05/09/22 0858   05/08/22 1706  Gastrointestinal Panel by PCR , Stool  (Gastrointestinal Panel by PCR, Stool                                                                                                                                                     **Does Not include CLOSTRIDIUM DIFFICILE testing. **If CDIFF testing is needed, place order from the "C Difficile Testing" order set.**)  Once,   URGENT        05/08/22 1705            Signed, Lorin Glass, MD [facility] 05/09/2022

## 2022-05-09 NOTE — Plan of Care (Signed)

## 2022-05-09 NOTE — TOC Initial Note (Signed)
Transition of Care [facility]) - Initial/Assessment Note    Patient Details  Name: Rebecca Gardner MRN: [id number] Date of Birth: Aug 03, 1995  Transition of Care [facility]) CM/SW Contact:    Adrian Prows, RN Phone Number: 05/09/2022, 10:19 AM  Clinical Narrative:                  Transition of Care (TOC) Screening Note   Patient Details  Name: Rebecca Gardner Date of Birth: September 20, 1995   Transition of Care [facility]) CM/SW Contact:    Adrian Prows, RN Phone Number: 05/09/2022, 10:19 AM    Transition of Care Department [facility]) has reviewed patient and no TOC needs have been identified at this time. We will continue to monitor patient advancement through interdisciplinary progression rounds. If new patient transition needs arise, please place a TOC consult.          Patient Goals and CMS Choice        Expected Discharge Plan and Services                                                Prior Living Arrangements/Services                       Activities of Daily Living Home Assistive Devices/Equipment: None ADL Screening (condition at time of admission) Patient's cognitive ability adequate to safely complete daily activities?: Yes Is the patient deaf or have difficulty hearing?: No Does the patient have difficulty seeing, even when wearing glasses/contacts?: No Does the patient have difficulty concentrating, remembering, or making decisions?: No Patient able to express need for assistance with ADLs?: Yes Does the patient have difficulty dressing or bathing?: No Independently performs ADLs?: Yes (appropriate for developmental age) Does the patient have difficulty walking or climbing stairs?: No Weakness of Legs: None Weakness of Arms/Hands: None  Permission Sought/Granted                  Emotional Assessment              Admission diagnosis:  Hypokalemia [E87.6] Nausea & vomiting [R11.2] Abdominal pain, unspecified  abdominal location [R10.9] Diarrhea, unspecified type [R19.7] Gastroenteritis [K52.9] Patient Active Problem List   Diagnosis Date Noted   Nausea & vomiting 05/08/2022   Nausea vomiting and diarrhea 05/08/2022   Abdominal pain 05/08/2022   Hypokalemia 05/08/2022   Gastroenteritis 05/08/2022   Migraine 05/05/2022   Psoriasis 09/12/2020   GAD (generalized anxiety disorder) 07/18/2020   Depression, major, single episode, mild (HCC) 07/18/2020   Supraventricular tachycardia during pregnancy 07/05/2019   Benign hypertension 05/22/2019   Right ovarian cyst 02/20/2019   Papilledema 02/05/2016   Cephalalgia 02/05/2016   Morbid obesity (HCC) 07/30/2015   PCP:  Junie Spencer, FNP Pharmacy:   [facility] [phone] - [place], [place] - [address] [place] [address] Phone: [phone] Fax: [phone]  [facility] [address] [place] [address] Phone: [phone] Fax: [phone]     Social Determinants of Health (SDOH) Interventions    Readmission Risk Interventions     No data to display

## 2022-05-10 DIAGNOSIS — R112 Nausea with vomiting, unspecified: Secondary | ICD-10-CM | POA: Diagnosis not present

## 2022-05-10 DIAGNOSIS — Z1152 Encounter for screening for COVID-19: Secondary | ICD-10-CM | POA: Diagnosis not present

## 2022-05-10 DIAGNOSIS — Z79899 Other long term (current) drug therapy: Secondary | ICD-10-CM | POA: Diagnosis not present

## 2022-05-10 DIAGNOSIS — A0839 Other viral enteritis: Secondary | ICD-10-CM | POA: Diagnosis not present

## 2022-05-10 DIAGNOSIS — R7401 Elevation of levels of liver transaminase levels: Secondary | ICD-10-CM | POA: Diagnosis not present

## 2022-05-10 DIAGNOSIS — R197 Diarrhea, unspecified: Secondary | ICD-10-CM | POA: Diagnosis not present

## 2022-05-10 DIAGNOSIS — E876 Hypokalemia: Secondary | ICD-10-CM | POA: Diagnosis not present

## 2022-05-10 DIAGNOSIS — Z7985 Long-term (current) use of injectable non-insulin antidiabetic drugs: Secondary | ICD-10-CM | POA: Diagnosis not present

## 2022-05-10 DIAGNOSIS — I10 Essential (primary) hypertension: Secondary | ICD-10-CM | POA: Diagnosis not present

## 2022-05-10 LAB — BASIC METABOLIC PANEL
Anion gap: 7 (ref 5–15)
BUN: 7 mg/dL (ref 6–20)
CO2: 16 mmol/L — ABNORMAL LOW (ref 22–32)
Calcium: 8.3 mg/dL — ABNORMAL LOW (ref 8.9–10.3)
Chloride: 116 mmol/L — ABNORMAL HIGH (ref 98–111)
Creatinine, Ser: 0.6 mg/dL (ref 0.44–1.00)
GFR, Estimated: >60 mL/min (ref >60)
Glucose, Bld: 91 mg/dL (ref 70–99)
Potassium: 3.3 mmol/L — ABNORMAL LOW (ref 3.5–5.1)
Sodium: 139 mmol/L (ref 135–145)

## 2022-05-10 LAB — CBC WITH DIFFERENTIAL/PLATELET
Abs Immature Granulocytes: 0.01 10*3/uL (ref 0.00–0.07)
Basophils Absolute: 0 10*3/uL (ref 0.0–0.1)
Basophils Relative: 0 %
Eosinophils Absolute: 0.3 10*3/uL (ref 0.0–0.5)
Eosinophils Relative: 4 %
HCT: 38.4 % (ref 36.0–46.0)
Hemoglobin: 12.4 g/dL (ref 12.0–15.0)
Immature Granulocytes: 0 %
Lymphocytes Relative: 46 %
Lymphs Abs: 3.1 10*3/uL (ref 0.7–4.0)
MCH: 26.2 pg (ref 26.0–34.0)
MCHC: 32.3 g/dL (ref 30.0–36.0)
MCV: 81.2 fL (ref 80.0–100.0)
Monocytes Absolute: 0.5 10*3/uL (ref 0.1–1.0)
Monocytes Relative: 7 %
Neutro Abs: 2.9 10*3/uL (ref 1.7–7.7)
Neutrophils Relative %: 43 %
Platelets: 255 10*3/uL (ref 150–400)
RBC: 4.73 MIL/uL (ref 3.87–5.11)
RDW: 14.4 % (ref 11.5–15.5)
WBC: 6.9 10*3/uL (ref 4.0–10.5)
nRBC: 0 % (ref 0.0–0.2)

## 2022-05-10 LAB — PHOSPHORUS: Phosphorus: 2.8 mg/dL (ref 2.5–4.6)

## 2022-05-10 LAB — MAGNESIUM: Magnesium: 1.9 mg/dL (ref 1.7–2.4)

## 2022-05-10 MED ORDER — POTASSIUM CHLORIDE CRYS ER 20 MEQ PO TBCR
40.0000 meq | EXTENDED_RELEASE_TABLET | Freq: Once | ORAL | Status: AC
  Administered 2022-05-10: 40 meq via ORAL
  Filled 2022-05-10: qty 2

## 2022-05-10 MED ORDER — LOPERAMIDE HCL 2 MG PO TABS: 2.0000 mg | ORAL_TABLET | Freq: Four times a day (QID) | ORAL | 0 refills | Status: AC | PRN

## 2022-05-10 NOTE — Progress Notes (Signed)
Nutrition Brief Note:   Pt screened for MST (Malnutrition Screening Tool).   Pt is in Observation status. Pt has been trying to intentionally lose weight. Pt with nausea and vomiting, pt started taking Wegovy which could be related.    Recommend outpatient nutrition appointment for weight management.   Bethann Humble, RD, LDN, CNSC.

## 2022-05-10 NOTE — Discharge Summary (Signed)
Physician Discharge Summary  [name] JXB:[id number] DOB: 11/28/1995 DOA: 05/08/2022  PCP: Junie Spencer, FNP  Admit date: 05/08/2022 Discharge date: 05/10/2022  Admitted From: Home Discharge disposition: Home  Recommendations at discharge:  Encourage adequate oral hydration at home.  Keep HCTZ on hold for next few days.  Continue to monitor blood pressure at home. Continue as needed Imodium 2 mg every 6 hours.   Brief narrative: Rebecca Gardner is a 26 y.o. female with PMH significant for morbid obesity, HTN, depression, migraine. 11/30, patient woke up early in the morning with nausea, vomiting, diarrhea.  Symptoms increased in severity and lasted for 24 hours. 12/1, patient woke up early in the morning with stabbing abdominal pain radiating to her back.  She presented to ED at [facility].   Initial workup showed potassium low at 3.1, WBC count 11.1, urine pregnancy test negative, urinalysis with hazy yellow urine, negative for infection. CT scan of abdomen did not show any acute intra-abdominal or intrapelvic pathology to account for her symptoms.  She was given IV fluid, electrolyte replacement, IV Zofran, IV morphine.  Her symptoms improved and she was discharged to home on Bentyl.   Around 2 PM in the afternoon, she had another episode of sudden abdominal pain radiating to back and returned back to the ED.  In the ED, she had a temperature of 100.2, tachycardic to 120, blood pressure in 140s, breathing on room air. Labs showed potassium low at 3, mildly elevated AST, ALT and lipase, WBC count normal Respiratory virus panel negative for COVID, flu Urinalysis did not show evidence of infection Stool assay negative for C diff.  Pending GI pathogen panel Admitted to hospitalist service for further evaluation and management.   Subjective: Patient was seen and examined this morning.  Lying on bed.  Not in distress.  No new symptoms.  Diarrhea much better.  We discussed about GI  pathogen panel showing astrovirus.  Supportive measures stressed.  Tolerating oral diet.  Labs improving.  Patient wants to be discharged today.   Assessment and plan: Acute viral gastroenteritis with Astrovirus Presented with sudden onset nausea, vomiting, diarrhea followed by abdominal pain. GI pathogen panel showed astrovirus.  Managed with supportive measures with aggressive IV hydration, IV antiemetics, electrolyte management.  Expected to self-limiting next few days. Symptoms are improving.  Patient states diarrhea is less often now.  Able to tolerate regular diet. Okay to discharge home today. Encourage adequate oral hydration at home.  Keep HCTZ on hold for next few days. Continue as needed Imodium 2 mg every 6 hours.  Hypokalemia/hypophosphatemia Levels improved with replacement.   Potassium 3.3 this morning.  1 dose of replacement given.   Patient has a prescription for 5 days of oral potassium replacement at home given during one of her ED visits.  She can continue it at home while she is still having some diarrhea.   Recent Labs  Lab 05/08/22 0410 05/08/22 1616 05/08/22 2150 05/09/22 0511 05/10/22 0436  K 3.1* 3.0*  --  2.8* 3.3*  MG  --   --  1.8 1.9 1.9  PHOS  --   --  1.9* 2.0* 2.8   Elevated liver enzymes or lipase Hepatic steatosis Mild gradual elevation liver enzymes and lipase as below.  CT abdomen without evidence of pancreatitis.  Showed hepatic steatosis. acute hepatitis panel nonreactive. Recent Labs  Lab 05/08/22 0410 05/08/22 1616 05/09/22 0511 05/10/22 0436  AST 30 61* 73*  --   ALT 26  53* 61*  --   ALKPHOS 60 123 135*  --   BILITOT 0.3 0.3 0.3  --   PROT 7.6 7.7 6.9  --   ALBUMIN 4.0 4.0 3.1*  --   LIPASE 16 55* 55*  --   PLT 322 314 260 255      Latest Ref Rng & Units 05/08/2022    9:50 PM  Hepatitis  Hep B Surface Ag NON REACTIVE NON REACTIVE   Hep B IgM NON REACTIVE NON REACTIVE   Hep C Ab NON REACTIVE NON REACTIVE   Hep A IgM NON  REACTIVE NON REACTIVE    Bilateral nephrolithiasis CT abdomen showed nonobstructive calculi present in both renal collecting systems L>R, largest up to 6 mm.  No evidence of ureteral stones or UTI. Unlikely to be the cause of acute symptoms at this time. Follow-up with urology as an outpatient  Essential hypertension PTA on HCTZ 12.5 mg daily.  Currently on hold.  She will keep it on hold for next few days while having active fluid loss.  Continue to monitor blood pressure at home.  Morbid obesity  Body mass index is 49.95 kg/m.  It seems patient has been trying to intentionally lose weight. Apparently she has lost 50 pounds in 1 month. 11/28, she was prescribed Wegovy (semaglutide) 0.5 mg weekly by her family physician.  Patient states she has not started to take it this morning. Patient has been advised to make an attempt to improve diet and exercise patterns to aid in weight loss.  Migraine Continue Topamax  Wounds:  - Incision (Closed) 02/02/14 Knee Left (Active)  Date First Assessed/Time First Assessed: 02/02/14 0953   Location: Knee  Location Orientation: Left    Assessments 02/02/2014 10:32 AM 02/02/2014 12:00 PM  Dressing Type Compression wrap Compression wrap  Dressing Clean;Dry;Intact Clean;Dry;Intact  Drainage Amount None None     No associated orders.     Incision (Closed) 04/10/16 Umbilicus Other (Comment) (Active)  Date First Assessed/Time First Assessed: 04/10/16 1343   Location: Umbilicus  Location Orientation: Other (Comment)    Assessments 04/10/2016  3:27 PM  Dressing Type Liquid skin adhesive  Dressing Clean;Dry;Intact  Site / Wound Assessment Clean;Dry  Margins Attached edges (approximated)  Closure Approximated;Skin glue  Drainage Amount None     No associated orders.     Incision (Closed) 04/10/16 Abdomen Right (Active)  Date First Assessed/Time First Assessed: 04/10/16 1343   Location: Abdomen  Location Orientation: Right    Assessments 04/10/2016   2:40 PM 04/10/2016  3:00 PM  Dressing Type Liquid skin adhesive Liquid skin adhesive  Dressing -- Clean;Dry;Intact  Drainage Amount None --     No associated orders.     Incision (Closed) 04/10/16 Abdomen Right (Active)  Date First Assessed/Time First Assessed: 04/10/16 1343   Location: Abdomen  Location Orientation: Right    No assessment data to display     No associated orders.     Incision (Closed) 04/10/16 Abdomen Other (Comment) (Active)  Date First Assessed/Time First Assessed: 04/10/16 1343   Location: Abdomen  Location Orientation: Other (Comment)    Assessments 04/10/2016  3:27 PM  Dressing Type Liquid skin adhesive  Dressing Clean;Dry;Intact  Site / Wound Assessment Clean;Dry  Margins Attached edges (approximated)  Closure Approximated;Skin glue  Drainage Amount None     No associated orders.    Discharge Exam:   Vitals:   05/09/22 2121 05/10/22 0130 05/10/22 0526 05/10/22 1021  BP: 110/65 (!) 102/48 (!) 121/46 Marland Kitchen(!)  125/90  Pulse: 87 77 73 79  Resp: Temp: 100 F (37.8 C) 98.6 F (37 C) 99 F (37.2 C) 98.1 F (36.7 C)  TempSrc: Oral   Oral  SpO2: 99% 99% 100% 100%  Weight:      Height:        Body mass index is 49.95 kg/m.   General exam: Pleasant, morbidly obese young female. Skin: No rashes, lesions or ulcers. HEENT: Atraumatic, normocephalic, no obvious bleeding Lungs: Clear to auscultation bilaterally CVS: Regular rate and rhythm, no murmur GI/Abd soft, nondistended, improved tenderness, bowel sound present CNS: Alert, awake, oriented x 3 Psychiatry: Mood appropriate Extremities: No pedal edema, no calf tenderness  Follow ups:    Follow-up Information     Junie Spencer, FNP Follow up.   Specialty: Family Medicine Contact information: [address] [place] [address] [phone]                 Discharge Instructions:   Discharge Instructions     Call MD for:  difficulty breathing, headache or  visual disturbances   Complete by: As directed    Call MD for:  extreme fatigue   Complete by: As directed    Call MD for:  hives   Complete by: As directed    Call MD for:  persistant dizziness or light-headedness   Complete by: As directed    Call MD for:  persistant nausea and vomiting   Complete by: As directed    Call MD for:  severe uncontrolled pain   Complete by: As directed    Call MD for:  temperature >100.4   Complete by: As directed    Diet general   Complete by: As directed    Discharge instructions   Complete by: As directed    Recommendations at discharge:   Encourage adequate oral hydration at home.   Keep HCTZ on hold for next few days.  Continue to monitor blood pressure at home.  Continue as needed Imodium 2 mg every 6 hours.  General discharge instructions: Follow with Primary MD Junie Spencer, FNP in 7 days  Please request your PCP  to go over your hospital tests, procedures, radiology results at the follow up. Please get your medicines reviewed and adjusted.  Your PCP may decide to repeat certain labs or tests as needed. Do not drive, operate heavy machinery, perform activities at heights, swimming or participation in water activities or provide baby sitting services if your were admitted for syncope or siezures until you have seen by Primary MD or a Neurologist and advised to do so again. [place] Controlled Substance Reporting System database was reviewed. Do not drive, operate heavy machinery, perform activities at heights, swim, participate in water activities or provide baby-sitting services while on medications for pain, sleep and mood until your outpatient physician has reevaluated you and advised to do so again.  You are strongly recommended to comply with the dose, frequency and duration of prescribed medications. Activity: As tolerated with Full fall precautions use walker/cane & assistance as needed Avoid using any recreational substances like  cigarette, tobacco, alcohol, or non-prescribed drug. If you experience worsening of your admission symptoms, develop shortness of breath, life threatening emergency, suicidal or homicidal thoughts you must seek medical attention immediately by calling 911 or calling your MD immediately  if symptoms less severe. You must read complete instructions/literature along with all the possible adverse reactions/side effects for all the medicines  you take and that have been prescribed to you. Take any new medicine only after you have completely understood and accepted all the possible adverse reactions/side effects.  Wear Seat belts while driving. You were cared for by a hospitalist during your hospital stay. If you have any questions about your discharge medications or the care you received while you were in the hospital after you are discharged, you can call the unit and ask to speak with the hospitalist or the covering physician. Once you are discharged, your primary care physician will handle any further medical issues. Please note that NO REFILLS for any discharge medications will be authorized once you are discharged, as it is imperative that you return to your primary care physician (or establish a relationship with a primary care physician if you do not have one).   Increase activity slowly   Complete by: As directed        Discharge Medications:   Allergies as of 05/10/2022   No Known Allergies      Medication List     STOP taking these medications    clobetasol cream 0.05 % Commonly known as: TEMOVATE       TAKE these medications    acetaminophen 325 MG tablet Commonly known as: TYLENOL Take 650 mg by mouth every 6 (six) hours as needed for headache.   dicyclomine 20 MG tablet Commonly known as: BENTYL Take 1 tablet (20 mg total) by mouth 2 (two) times daily.   hydrochlorothiazide 12.5 MG tablet Commonly known as: HYDRODIURIL Take 1 tablet (12.5 mg total) by mouth daily.    loperamide 2 MG tablet Commonly known as: Imodium A-D Take 1 tablet (2 mg total) by mouth 4 (four) times daily as needed for up to 5 days for diarrhea or loose stools.   norgestimate-ethinyl estradiol 0.25-35 MG-MCG tablet Commonly known as: ORTHO-CYCLEN TAKE 1 TABLET BY MOUTH EVERY DAY   omeprazole 20 MG capsule Commonly known as: PRILOSEC Take 1 capsule (20 mg total) by mouth daily. What changed: when to take this   ondansetron 4 MG disintegrating tablet Commonly known as: ZOFRAN-ODT Take 1 tablet (4 mg total) by mouth every 8 (eight) hours as needed for nausea or vomiting.   potassium chloride SA 20 MEQ tablet Commonly known as: KLOR-CON M Take 1 tablet (20 mEq total) by mouth daily.   SUMAtriptan 100 MG tablet Commonly known as: Imitrex Take 1 tablet (100 mg total) by mouth every 2 (two) hours as needed for migraine. May repeat in 2 hours if headache persists or recurs.   topiramate 50 MG tablet Commonly known as: Topamax Take 1 tablet (50 mg total) by mouth 2 (two) times daily.   Wegovy 0.5 MG/0.5ML Soaj Generic drug: Semaglutide-Weight Management Inject 0.5 mg into the skin once a week.         The results of significant diagnostics from this hospitalization (including imaging, microbiology, ancillary and laboratory) are listed below for reference.    Procedures and Diagnostic Studies:   CT Abdomen Pelvis W Contrast  Result Date: 05/08/2022 CLINICAL DATA:  26 year old female with history of right lower quadrant abdominal pain, nausea, vomiting and diarrhea. EXAM: CT ABDOMEN AND PELVIS WITH CONTRAST TECHNIQUE: Multidetector CT imaging of the abdomen and pelvis was performed using the standard protocol following bolus administration of intravenous contrast. RADIATION DOSE REDUCTION: This exam was performed according to the departmental dose-optimization program which includes automated exposure control, adjustment of the mA and/or kV according to patient size and/or  use  of iterative reconstruction technique. CONTRAST:  OMNIPAQUE IOHEXOL 300 MG/ML  SOLN COMPARISON:  CT of the abdomen and pelvis 07/30/2018. FINDINGS: Lower chest: Unremarkable. Hepatobiliary: Diffuse low attenuation throughout the hepatic parenchyma, indicative of a background of hepatic steatosis. No suspicious cystic or solid hepatic lesions. Status post cholecystectomy. Pancreas: No pancreatic mass. No pancreatic ductal dilatation. No pancreatic or peripancreatic fluid collections or inflammatory changes. Spleen: Unremarkable. Adrenals/Urinary Tract: Nonobstructive calculi are noted within the collecting systems of both kidneys measuring up to 6 mm in the interpolar collecting system of the left kidney. No calculi are noted along the course of either ureter or within the lumen of the urinary bladder. No hydroureteronephrosis. Mild multifocal cortical thinning in the kidneys bilaterally. No suspicious renal lesions. Bilateral adrenal glands are normal in appearance. Stomach/Bowel: The appearance of the stomach is unremarkable. No pathologic dilatation of small bowel or colon. Normal appendix. Vascular/Lymphatic: No significant atherosclerotic disease, aneurysm or dissection noted in the abdominal or pelvic vasculature. No lymphadenopathy noted in the abdomen or pelvis. Reproductive: Uterus and ovaries are unremarkable in appearance. Other: No significant volume of ascites.  No pneumoperitoneum. Musculoskeletal: There are no aggressive appearing lytic or blastic lesions noted in the visualized portions of the skeleton. IMPRESSION: 1. No acute findings are noted in the abdomen or pelvis to account for the patient's symptoms. 2. Nonobstructive calculi are present within the renal collecting systems bilaterally (left-greater-than-right), largest of which measures up to 6 mm in the interpolar collecting system of the left kidney. No ureteral stones or findings of urinary tract obstruction are noted at this  time. 3. Hepatic steatosis. Electronically Signed   By: Trudie Reed M.D.   On: 05/08/2022 06:53     Labs:   Basic Metabolic Panel: Recent Labs  Lab 05/08/22 0410 05/08/22 1616 05/08/22 2150 05/09/22 0511 05/10/22 0436  NA 134* 135  --  137 139  K 3.1* 3.0*  --  2.8* 3.3*  CL 105 105  --  112* 116*  CO2 17* 20*  --  18* 16*  GLUCOSE 108* 96  --  109* 91  BUN 10 8  --  7 7  CREATININE 0.90 0.84  --  0.72 0.60  CALCIUM 8.5* 8.5*  --  7.9* 8.3*  MG  --   --  1.8 1.9 1.9  PHOS  --   --  1.9* 2.0* 2.8   GFR Estimated Creatinine Clearance: 164.7 mL/min (by C-G formula based on SCr of 0.6 mg/dL). Liver Function Tests: Recent Labs  Lab 05/08/22 0410 05/08/22 1616 05/09/22 0511  AST 30 61* 73*  ALT 26 53* 61*  ALKPHOS 60 123 135*  BILITOT 0.3 0.3 0.3  PROT 7.6 7.7 6.9  ALBUMIN 4.0 4.0 3.1*   Recent Labs  Lab 05/08/22 0410 05/08/22 1616 05/09/22 0511  LIPASE 16 55* 55*   No results for input(s): "AMMONIA" in the last 168 hours. Coagulation profile No results for input(s): "INR", "PROTIME" in the last 168 hours.  CBC: Recent Labs  Lab 05/08/22 0410 05/08/22 1616 05/09/22 0511 05/10/22 0436  WBC 11.1* 7.0 6.7 6.9  NEUTROABS 7.5  --  3.8 2.9  HGB 14.2 14.1 12.2 12.4  HCT 42.3 43.0 38.1 38.4  MCV 77.9* 78.9* 81.2 81.2  PLT 322 314 260 255   Cardiac Enzymes: No results for input(s): "CKTOTAL", "CKMB", "CKMBINDEX", "TROPONINI" in the last 168 hours. BNP: Invalid input(s): "POCBNP" CBG: No results for input(s): "GLUCAP" in the last 168 hours. D-Dimer No results for input(s): "  DDIMER" in the last 72 hours. Hgb A1c No results for input(s): "HGBA1C" in the last 72 hours. Lipid Profile No results for input(s): "CHOL", "HDL", "LDLCALC", "TRIG", "CHOLHDL", "LDLDIRECT" in the last 72 hours. Thyroid function studies No results for input(s): "TSH", "T4TOTAL", "T3FREE", "THYROIDAB" in the last 72 hours.  Invalid input(s): "FREET3" Anemia work up No results  for input(s): "VITAMINB12", "FOLATE", "FERRITIN", "TIBC", "IRON", "RETICCTPCT" in the last 72 hours. Microbiology Recent Results (from the past 240 hour(s))  Gastrointestinal Panel by PCR , Stool     Status: Abnormal   Collection Time: 05/08/22  6:28 PM   Specimen: Anterior Nasal Swab; Stool  Result Value Ref Range Status   Campylobacter species NOT DETECTED NOT DETECTED Final   Plesimonas shigelloides NOT DETECTED NOT DETECTED Final   Salmonella species NOT DETECTED NOT DETECTED Final   Yersinia enterocolitica NOT DETECTED NOT DETECTED Final   Vibrio species NOT DETECTED NOT DETECTED Final   Vibrio cholerae NOT DETECTED NOT DETECTED Final   Enteroaggregative E coli (EAEC) NOT DETECTED NOT DETECTED Final   Enteropathogenic E coli (EPEC) NOT DETECTED NOT DETECTED Final   Enterotoxigenic E coli (ETEC) NOT DETECTED NOT DETECTED Final   Shiga like toxin producing E coli (STEC) NOT DETECTED NOT DETECTED Final   Shigella/Enteroinvasive E coli (EIEC) NOT DETECTED NOT DETECTED Final   Cryptosporidium NOT DETECTED NOT DETECTED Final   Cyclospora cayetanensis NOT DETECTED NOT DETECTED Final   Entamoeba histolytica NOT DETECTED NOT DETECTED Final   Giardia lamblia NOT DETECTED NOT DETECTED Final   Adenovirus F40/41 NOT DETECTED NOT DETECTED Final   Astrovirus DETECTED (A) NOT DETECTED Final   Norovirus GI/GII NOT DETECTED NOT DETECTED Final   Rotavirus A NOT DETECTED NOT DETECTED Final   Sapovirus (I, II, IV, and V) NOT DETECTED NOT DETECTED Final    Comment: Performed at [facility], [address] [place], [place] [address]  C Difficile Quick Screen w PCR reflex     Status: None   Collection Time: 05/08/22  6:28 PM   Specimen: Anterior Nasal Swab; Stool  Result Value Ref Range Status   C Diff antigen NEGATIVE NEGATIVE Final   C Diff toxin NEGATIVE NEGATIVE Final   C Diff interpretation No C. difficile detected.  Final    Comment: Performed at [facility], [address], [place], [place] [address]  Resp Panel by RT-PCR (Flu A&B, Covid) Anterior Nasal Swab     Status: None   Collection Time: 05/08/22  6:28 PM   Specimen: Anterior Nasal Swab  Result Value Ref Range Status   SARS Coronavirus 2 by RT PCR NEGATIVE NEGATIVE Final    Comment: (NOTE) SARS-CoV-2 target nucleic acids are NOT DETECTED.  The SARS-CoV-2 RNA is generally detectable in upper respiratory specimens during the acute phase of infection. The lowest concentration of SARS-CoV-2 viral copies this assay can detect is 138 copies/mL. A negative result does not preclude SARS-Cov-2 infection and should not be used as the sole basis for treatment or other patient management decisions. A negative result may occur with  improper specimen collection/handling, submission of specimen other than nasopharyngeal swab, presence of viral mutation(s) within the areas targeted by this assay, and inadequate number of viral copies(<138 copies/mL). A negative result must be combined with clinical observations, patient history, and epidemiological information. The expected result is Negative.  Fact Sheet for Patients:  BloggerCourse.com  Fact Sheet for Healthcare Providers:  SeriousBroker.it  This test is no t yet approved or  cleared by the [place] and  has been authorized for detection and/or diagnosis of SARS-CoV-2 by FDA under an Emergency Use Authorization (EUA). This EUA will remain  in effect (meaning this test can be used) for the duration of the COVID-19 declaration under Section 564(b)(1) of the Act, 21 U.S.C.section 360bbb-3(b)(1), unless the authorization is terminated  or revoked sooner.       Influenza A by PCR NEGATIVE NEGATIVE Final   Influenza B by PCR NEGATIVE NEGATIVE Final    Comment: (NOTE) The Xpert Xpress SARS-CoV-2/FLU/RSV plus assay is intended as an aid in the diagnosis of influenza from Nasopharyngeal swab specimens  and should not be used as a sole basis for treatment. Nasal washings and aspirates are unacceptable for Xpert Xpress SARS-CoV-2/FLU/RSV testing.  Fact Sheet for Patients: BloggerCourse.com  Fact Sheet for Healthcare Providers: SeriousBroker.it  This test is not yet approved or cleared by the [place] FDA and has been authorized for detection and/or diagnosis of SARS-CoV-2 by FDA under an Emergency Use Authorization (EUA). This EUA will remain in effect (meaning this test can be used) for the duration of the COVID-19 declaration under Section 564(b)(1) of the Act, 21 U.S.C. section 360bbb-3(b)(1), unless the authorization is terminated or revoked.  Performed at [facility], [address] [place], [place] [address]     Time coordinating discharge: 35 minutes  Signed: Melina Schools Jader Desai  [facility] 05/10/2022, 11:33 AM

## 2022-05-10 NOTE — Progress Notes (Signed)
AVS given to Pt.  Pt had all belongings.  All questions answered.  Pt stable at time of discharge

## 2022-05-14 ENCOUNTER — Other Ambulatory Visit: Payer: Self-pay | Admitting: Family

## 2022-05-14 DIAGNOSIS — G43111 Migraine with aura, intractable, with status migrainosus: Secondary | ICD-10-CM

## 2022-06-04 ENCOUNTER — Ambulatory Visit: Payer: 59 | Admitting: Family

## 2022-06-14 ENCOUNTER — Other Ambulatory Visit: Payer: Self-pay | Admitting: Family

## 2022-06-14 DIAGNOSIS — G43111 Migraine with aura, intractable, with status migrainosus: Secondary | ICD-10-CM

## 2022-06-22 ENCOUNTER — Other Ambulatory Visit (HOSPITAL_COMMUNITY): Payer: Self-pay

## 2022-06-24 ENCOUNTER — Other Ambulatory Visit: Payer: Self-pay

## 2022-07-02 ENCOUNTER — Other Ambulatory Visit: Payer: Self-pay

## 2022-07-02 ENCOUNTER — Ambulatory Visit: Payer: Commercial Managed Care - PPO | Admitting: Family

## 2022-07-02 ENCOUNTER — Encounter: Payer: Self-pay | Admitting: Family

## 2022-07-02 VITALS — BP 131/89 | HR 97 | Temp 97.1°F | Ht 68.0 in | Wt 338.4 lb

## 2022-07-02 DIAGNOSIS — I10 Essential (primary) hypertension: Secondary | ICD-10-CM | POA: Diagnosis not present

## 2022-07-02 DIAGNOSIS — K219 Gastro-esophageal reflux disease without esophagitis: Secondary | ICD-10-CM | POA: Diagnosis not present

## 2022-07-02 DIAGNOSIS — G43109 Migraine with aura, not intractable, without status migrainosus: Secondary | ICD-10-CM | POA: Diagnosis not present

## 2022-07-02 DIAGNOSIS — F320 Major depressive disorder, single episode, mild: Secondary | ICD-10-CM | POA: Diagnosis not present

## 2022-07-02 DIAGNOSIS — F411 Generalized anxiety disorder: Secondary | ICD-10-CM

## 2022-07-02 DIAGNOSIS — Z713 Dietary counseling and surveillance: Secondary | ICD-10-CM

## 2022-07-02 LAB — CMP14+EGFR
ALT: 11 IU/L (ref 0–32)
AST: 10 IU/L (ref 0–40)
Albumin/Globulin Ratio: 1.3 (ref 1.2–2.2)
Albumin: 3.9 g/dL — ABNORMAL LOW (ref 4.0–5.0)
Alkaline Phosphatase: 59 IU/L (ref 44–121)
BUN/Creatinine Ratio: 12 (ref 9–23)
BUN: 10 mg/dL (ref 6–20)
Bilirubin Total: <0.2 mg/dL (ref 0.0–1.2)
CO2: 18 mmol/L — ABNORMAL LOW (ref 20–29)
Calcium: 9.5 mg/dL (ref 8.7–10.2)
Chloride: 104 mmol/L (ref 96–106)
Creatinine, Ser: 0.83 mg/dL (ref 0.57–1.00)
Globulin, Total: 3.1 g/dL (ref 1.5–4.5)
Glucose: 85 mg/dL (ref 70–99)
Potassium: 4.3 mmol/L (ref 3.5–5.2)
Sodium: 136 mmol/L (ref 134–144)
Total Protein: 7 g/dL (ref 6.0–8.5)
eGFR: 99 mL/min/{1.73_m2} (ref >59)

## 2022-07-02 MED ORDER — WEGOVY 0.5 MG/0.5ML ~~LOC~~ SOAJ: 0.5000 mg | SUBCUTANEOUS | 2 refills | Status: DC

## 2022-07-02 MED ORDER — OMEPRAZOLE 20 MG PO CPDR: 20.0000 mg | DELAYED_RELEASE_CAPSULE | Freq: Two times a day (BID) | ORAL | 3 refills | Status: DC

## 2022-07-02 NOTE — Patient Instructions (Signed)

## 2022-07-02 NOTE — Progress Notes (Signed)
Subjective:    Patient ID: Rebecca Gardner, female    DOB: Jan 27, 1996, 27 y.o.   MRN: [id number]  Chief Complaint  Patient presents with   Medical Management of Chronic Issues   PT presents to the office today for chronic follow up. She just started [facility] 0.25 yesterday because of the backorder.   Hypertension This is a chronic problem. The current episode started more than 1 year ago. The problem has been resolved since onset. The problem is controlled. Pertinent negatives include no malaise/fatigue, peripheral edema or shortness of breath. Risk factors for coronary artery disease include dyslipidemia, obesity and sedentary lifestyle. The current treatment provides moderate improvement.  Migraine  This is a chronic problem. The current episode started more than 1 year ago. The problem occurs monthly. The pain is located in the Right unilateral region. Associated symptoms include phonophobia and photophobia. Pertinent negatives include no nausea. She has tried beta blockers and triptans for the symptoms. The treatment provided moderate relief. Her past medical history is significant for hypertension.  Gastroesophageal Reflux She complains of belching and heartburn. She reports no nausea. This is a chronic problem. The current episode started more than 1 year ago. The problem occurs occasionally. Risk factors include obesity. The treatment provided moderate relief.      Review of Systems  Constitutional:  Negative for malaise/fatigue.  Eyes:  Positive for photophobia.  Respiratory:  Negative for shortness of breath.   Gastrointestinal:  Positive for heartburn. Negative for nausea.  All other systems reviewed and are negative.      Objective:   Physical Exam Vitals reviewed.  Constitutional:      General: She is not in acute distress.    Appearance: She is well-developed. She is obese.  HENT:     Head: Normocephalic and atraumatic.     Right Ear: Tympanic membrane normal.      Left Ear: Tympanic membrane normal.  Eyes:     Pupils: Pupils are equal, round, and reactive to light.  Neck:     Thyroid: No thyromegaly.  Cardiovascular:     Rate and Rhythm: Normal rate and regular rhythm.     Heart sounds: Normal heart sounds. No murmur heard. Pulmonary:     Effort: Pulmonary effort is normal. No respiratory distress.     Breath sounds: Normal breath sounds. No wheezing.  Abdominal:     General: Bowel sounds are normal. There is no distension.     Palpations: Abdomen is soft.     Tenderness: There is no abdominal tenderness.  Musculoskeletal:        General: No tenderness. Normal range of motion.     Cervical back: Normal range of motion and neck supple.  Skin:    General: Skin is warm and dry.  Neurological:     Mental Status: She is alert and oriented to person, place, and time.     Cranial Nerves: No cranial nerve deficit.     Deep Tendon Reflexes: Reflexes are normal and symmetric.  Psychiatric:        Behavior: Behavior normal.        Thought Content: Thought content normal.        Judgment: Judgment normal.       BP 131/89   Pulse 97   Temp (!) 97.1 F (36.2 C) (Temporal)   Ht 5\' 8"  (1.727 m)   Wt (!) 338 lb 6.4 oz (153.5 kg)   SpO2 97%   BMI 51.45 kg/m  Assessment & Plan:  Rebecca Gardner comes in today with chief complaint of Medical Management of Chronic Issues   Diagnosis and orders addressed:  1. Gastroesophageal reflux disease without esophagitis - omeprazole (PRILOSEC) 20 MG capsule; Take 1 capsule (20 mg total) by mouth 2 (two) times daily before a meal.  Dispense: 90 capsule; Refill: 3 - CMP14+EGFR  2. Benign hypertension - Semaglutide-Weight Management (WEGOVY) 0.5 MG/0.5ML SOAJ; Inject 0.5 mg into the skin once a week.  Dispense: 2 mL; Refill: 2 - CMP14+EGFR  3. Depression, major, single episode, mild (HCC) - CMP14+EGFR  4. GAD (generalized anxiety disorder) - CMP14+EGFR  5. Migraine with aura and without status  migrainosus, not intractable - CMP14+EGFR  6. Morbid obesity (HCC)  - Semaglutide-Weight Management (WEGOVY) 0.5 MG/0.5ML SOAJ; Inject 0.5 mg into the skin once a week.  Dispense: 2 mL; Refill: 2 - CMP14+EGFR  7. Weight loss counseling, encounter for Continue Wegovy 0.25 mg for a couple of weeks, then increase to 0.5 mg - Semaglutide-Weight Management (WEGOVY) 0.5 MG/0.5ML SOAJ; Inject 0.5 mg into the skin once a week.  Dispense: 2 mL; Refill: 2 - CMP14+EGFR   Labs pending Health Maintenance reviewed Diet and exercise encouraged  Follow up plan: 2 months for weight loss    Evelina Dun, FNP

## 2022-07-03 ENCOUNTER — Other Ambulatory Visit (HOSPITAL_COMMUNITY): Payer: Self-pay

## 2022-07-04 ENCOUNTER — Other Ambulatory Visit (HOSPITAL_COMMUNITY): Payer: Self-pay

## 2022-07-11 ENCOUNTER — Other Ambulatory Visit (HOSPITAL_COMMUNITY): Payer: Self-pay

## 2022-07-12 ENCOUNTER — Other Ambulatory Visit: Payer: Self-pay | Admitting: Family

## 2022-07-12 DIAGNOSIS — G43111 Migraine with aura, intractable, with status migrainosus: Secondary | ICD-10-CM

## 2022-07-27 ENCOUNTER — Other Ambulatory Visit (HOSPITAL_COMMUNITY): Payer: Self-pay

## 2022-08-02 ENCOUNTER — Telehealth: Payer: Self-pay | Admitting: Internal Medicine

## 2022-08-02 MED ORDER — PREDNISONE 20 MG PO TABS: 40.0000 mg | ORAL_TABLET | Freq: Every day | ORAL | 0 refills | Status: DC

## 2022-08-02 MED ORDER — AMOXICILLIN-POT CLAVULANATE 875-125 MG PO TABS: 1.0000 | ORAL_TABLET | Freq: Two times a day (BID) | ORAL | 0 refills | Status: DC

## 2022-08-02 NOTE — Telephone Encounter (Signed)
Sinus pain, congestion x  1 week. Rx augmentin has worked in past for this.  RTC if no improvement.  Erskine Emery MD Pulm

## 2022-08-06 ENCOUNTER — Other Ambulatory Visit (HOSPITAL_COMMUNITY): Payer: Self-pay

## 2022-08-14 ENCOUNTER — Other Ambulatory Visit (HOSPITAL_COMMUNITY): Payer: Self-pay

## 2022-09-10 ENCOUNTER — Other Ambulatory Visit: Payer: Self-pay | Admitting: Family

## 2022-09-10 DIAGNOSIS — Z30011 Encounter for initial prescription of contraceptive pills: Secondary | ICD-10-CM

## 2022-10-05 ENCOUNTER — Telehealth: Payer: Commercial Managed Care - PPO

## 2022-10-05 ENCOUNTER — Ambulatory Visit
Admission: EM | Admit: 2022-10-05 | Discharge: 2022-10-05 | Disposition: A | Discharge status: Home / Self Care | Payer: Commercial Managed Care - PPO | Attending: Physician Assistant | Admitting: Physician Assistant

## 2022-10-05 DIAGNOSIS — J029 Acute pharyngitis, unspecified: Secondary | ICD-10-CM | POA: Diagnosis not present

## 2022-10-05 LAB — POCT RAPID STREP A (OFFICE): Rapid Strep A Screen: NEGATIVE

## 2022-10-05 MED ORDER — AMOXICILLIN 500 MG PO CAPS: 500.0000 mg | ORAL_CAPSULE | Freq: Three times a day (TID) | ORAL | 0 refills | Status: DC

## 2022-10-05 NOTE — Discharge Instructions (Addendum)
See your Physician for recheck.  °

## 2022-10-05 NOTE — ED Triage Notes (Signed)
Pt reports she has a sore throat, fever, body aches, cough x 2 days. Took tylenol and ibuprofen.   Daughter has strep.  Pt wants test.

## 2022-10-06 ENCOUNTER — Telehealth: Payer: Self-pay | Admitting: Internal Medicine

## 2022-10-06 MED ORDER — PROMETHAZINE HCL 6.25 MG/5ML PO SOLN: 12.5000 mg | Freq: Every evening | ORAL | 0 refills | Status: DC | PRN

## 2022-10-06 MED ORDER — CEFDINIR 300 MG PO CAPS: 300.0000 mg | ORAL_CAPSULE | Freq: Two times a day (BID) | ORAL | 0 refills | Status: AC

## 2022-10-06 MED ORDER — PREDNISONE 20 MG PO TABS: 40.0000 mg | ORAL_TABLET | Freq: Every day | ORAL | 0 refills | Status: AC

## 2022-10-06 NOTE — Telephone Encounter (Signed)
Severe bronchitis, question allergic; amoxicillin not working. - Cefdinir, prednisone, qHS phernergan PRN  F/u with urgent care or PCP if not helping for consideration of imaging  Myrla Halsted MD  Pulmonary Critical Care

## 2022-10-06 NOTE — ED Provider Notes (Signed)
CSN: [id number] Arrival date & time: 10/05/22  5188      History   Chief Complaint Chief Complaint  Patient presents with   Sore Throat    Entered by patient    HPI Rebecca Gardner is a 26 y.o. female.   Pt complains of a fever chills cough and bodyaches.  The history is provided by the patient. No language interpreter was used.  Sore Throat This is a new problem. The problem occurs constantly. The problem has not changed since onset.Nothing aggravates the symptoms. Nothing relieves the symptoms. She has tried nothing for the symptoms. The treatment provided no relief.    Past Medical History:  Diagnosis Date   Acute lateral meniscus tear of left knee 02/02/2014   Acute medial meniscus tear of left knee    Concussion with brief (less than one hour) loss of consciousness 4-28 15   Depression    Gall stones 03/27/2016   Hypertension    Nephrolithiasis     Patient Active Problem List   Diagnosis Date Noted   Abdominal pain 05/08/2022   Hypokalemia 05/08/2022   Gastroenteritis 05/08/2022   Migraine 05/05/2022   Psoriasis 09/12/2020   GAD (generalized anxiety disorder) 07/18/2020   Depression, major, single episode, mild (HCC) 07/18/2020   Supraventricular tachycardia during pregnancy 07/05/2019   Benign hypertension 05/22/2019   Right ovarian cyst 02/20/2019   Papilledema 02/05/2016   Cephalalgia 02/05/2016   Morbid obesity (HCC) 07/30/2015    Past Surgical History:  Procedure Laterality Date   CHOLECYSTECTOMY N/A 04/10/2016   Procedure: LAPAROSCOPIC CHOLECYSTECTOMY;  Surgeon: Rodman Pickle, MD;  Location: [facility] OR;  Service: General;  Laterality: N/A;   KNEE ARTHROSCOPY WITH LATERAL MENISECTOMY Left 02/02/2014   Procedure: LEFT KNEE ARTHROSCOPY WITH LATERAL MENISECTOMY;  Surgeon: Eulas Post, MD;  Location: [facility];  Service: Orthopedics;  Laterality: Left;   TONSILLECTOMY      OB History     Gravida  1   Para  1    Term  1   Preterm  0   AB  0   Living  1      SAB  0   IAB  0   Ectopic  0   Multiple  0   Live Births  1            Home Medications    Prior to Admission medications   Medication Sig Start Date End Date Taking? Authorizing Provider  cefdinir (OMNICEF) 300 MG capsule Take 1 capsule (300 mg total) by mouth 2 (two) times daily for 5 days. 10/06/22 10/11/22  Lorin Glass, MD  hydrochlorothiazide (HYDRODIURIL) 12.5 MG tablet Take 1 tablet (12.5 mg total) by mouth daily. 04/17/22   Junie Spencer, FNP  norgestimate-ethinyl estradiol (ORTHO-CYCLEN) 0.25-35 MG-MCG tablet TAKE 1 TABLET BY MOUTH EVERY DAY 09/10/22   Jannifer Rodney A, FNP  omeprazole (PRILOSEC) 20 MG capsule Take 1 capsule (20 mg total) by mouth 2 (two) times daily before a meal. 07/02/22   Hawks, Neysa Bonito A, FNP  ondansetron (ZOFRAN-ODT) 4 MG disintegrating tablet Take 1 tablet (4 mg total) by mouth every 8 (eight) hours as needed for nausea or vomiting. 05/08/22   Pollyann Savoy, MD  predniSONE (DELTASONE) 20 MG tablet Take 2 tablets (40 mg total) by mouth daily with breakfast for 5 days. 10/06/22 10/11/22  Lorin Glass, MD  promethazine (PHENERGAN) 6.25 MG/5ML solution Take 10 mLs (12.5 mg total) by  mouth at bedtime as needed (cough). 10/06/22   Lorin Glass, MD  Semaglutide-Weight Management [facility]) 0.5 MG/0.5ML SOAJ Inject 0.5 mg into the skin once a week. 07/02/22   Junie Spencer, FNP  SUMAtriptan (IMITREX) 100 MG tablet TAKE 1 TABLET (100 MG) AS NEEDED FOR MIGRAINE. MAY REPEAT IN 2 HOURS IF HEADACHE PERSISTS OR RECURS. 07/13/22   Jannifer Rodney A, FNP  topiramate (TOPAMAX) 50 MG tablet Take 1 tablet (50 mg total) by mouth 2 (two) times daily. 05/05/22   Junie Spencer, FNP    Family History Family History  Problem Relation Age of Onset   Breast cancer Paternal Grandmother    Healthy Mother    Atrial fibrillation Father    Prostate cancer Paternal Grandfather    Cancer Paternal Grandfather         liver   Migraines Maternal Uncle    COPD Maternal Uncle    Jaundice Daughter     Social History Social History   Tobacco Use   Smoking status: Never   Smokeless tobacco: Never  Vaping Use   Vaping Use: Never used  Substance Use Topics   Alcohol use: No   Drug use: No     Allergies   Patient has no known allergies.   Review of Systems Review of Systems  All other systems reviewed and are negative.    Physical Exam Triage Vital Signs ED Triage Vitals  Enc Vitals Group     BP 10/05/22 1045 (!) 135/95     Pulse Rate 10/05/22 1045 90     Resp 10/05/22 1045 20     Temp 10/05/22 1045 98.5 F (36.9 C)     Temp Source 10/05/22 1045 Oral     SpO2 10/05/22 1045 98 %     Weight --      Height --      Head Circumference --      Peak Flow --      Pain Score 10/05/22 1042 10     Pain Loc --      Pain Edu? --      Excl. in GC? --    No data found.  Updated Vital Signs BP (!) 135/95 (BP Location: Right Arm)   Pulse 90   Temp 98.5 F (36.9 C) (Oral)   Resp 20   LMP 09/15/2022 (Approximate)   SpO2 98%   Visual Acuity Right Eye Distance:   Left Eye Distance:   Bilateral Distance:    Right Eye Near:   Left Eye Near:    Bilateral Near:     Physical Exam Vitals and nursing note reviewed.  Constitutional:      Appearance: She is well-developed.  HENT:     Head: Normocephalic.     Right Ear: Tympanic membrane normal.     Left Ear: Tympanic membrane normal.     Mouth/Throat:     Mouth: Mucous membranes are moist.     Pharynx: Posterior oropharyngeal erythema present.     Tonsils: 2+ on the right. 2+ on the left.  Cardiovascular:     Rate and Rhythm: Normal rate.  Pulmonary:     Effort: Pulmonary effort is normal.  Abdominal:     General: There is no distension.  Musculoskeletal:        General: Normal range of motion.     Cervical back: Normal range of motion.  Skin:    General: Skin is warm.  Neurological:     General: No focal  deficit present.      Mental Status: She is alert and oriented to person, place, and time.      UC Treatments / Results  Labs (all labs ordered are listed, but only abnormal results are displayed) Labs Reviewed  POCT RAPID STREP A (OFFICE)    EKG   Radiology No results found.  Procedures Procedures (including critical care time)  Medications Ordered in UC Medications - No data to display  Initial Impression / Assessment and Plan / UC Course  I have reviewed the triage vital signs and the nursing notes.  Pertinent labs & imaging results that were available during my care of the patient were reviewed by me and considered in my medical decision making (see chart for details).     MDM I suspect patient has a strep infection I will treat with amoxicillin.  Patient is advised to follow-up with her primary care physician for recheck Final Clinical Impressions(s) / UC Diagnoses   Final diagnoses:  Acute pharyngitis, unspecified etiology     Discharge Instructions      See your Physician for recheck   ED Prescriptions     Medication Sig Dispense Auth. Provider   amoxicillin (AMOXIL) 500 MG capsule Take 1 capsule (500 mg total) by mouth 3 (three) times daily. 30 capsule Elson Areas, [place]      PDMP not reviewed this encounter. An After Visit Summary was printed and given to the patient.       Elson Areas, [place] 10/06/22 1429

## 2023-01-29 ENCOUNTER — Ambulatory Visit: Payer: Commercial Managed Care - PPO | Admitting: Family

## 2023-01-29 ENCOUNTER — Encounter: Payer: Self-pay | Admitting: Family

## 2023-01-29 VITALS — BP 137/85 | HR 95 | Temp 97.9°F | Ht 68.0 in | Wt 335.4 lb

## 2023-01-29 DIAGNOSIS — R5383 Other fatigue: Secondary | ICD-10-CM

## 2023-01-29 DIAGNOSIS — L659 Nonscarring hair loss, unspecified: Secondary | ICD-10-CM | POA: Diagnosis not present

## 2023-01-29 DIAGNOSIS — E538 Deficiency of other specified B group vitamins: Secondary | ICD-10-CM

## 2023-01-29 DIAGNOSIS — M79672 Pain in left foot: Secondary | ICD-10-CM | POA: Diagnosis not present

## 2023-01-29 MED ORDER — DICLOFENAC SODIUM 75 MG PO TBEC: 75.0000 mg | DELAYED_RELEASE_TABLET | Freq: Two times a day (BID) | ORAL | 2 refills | Status: DC

## 2023-01-29 NOTE — Patient Instructions (Signed)
 Heel Spur  A heel spur is a bony growth that forms on the bottom of the heel bone (calcaneus). Heel spurs are common. They often cause inflammation in the plantar fascia, which is the band of tissue that connects the toe bones to the heel bone. When the plantar fascia is inflamed, it is called plantar fasciitis. This may cause pain on the bottom of the foot, near the heel. Many people with plantar fasciitis also have heel spurs. However, spurs are not the cause of plantar fasciitis pain. What are the causes? The exact cause of heel spurs is not known. They may be caused by: Pressure on the heel bone. Bands of tissues that connect muscle to bone (tendons) pulling on the heel bone. What increases the risk? You are more likely to develop this condition if you: Are older than age 70. Are overweight. Have wear-and-tear arthritis (osteoarthritis). Have plantar fascia inflammation. Participate in sports or activities that include a lot of running or jumping. Wear poorly fitted shoes. What are the signs or symptoms? Some people have no symptoms. If you do have symptoms, they may include: Pain in the bottom of your heel. Pain that is worse when you first get out of bed. Pain that gets worse after walking or standing. How is this diagnosed? This condition may be diagnosed based on: Your symptoms and medical history. A physical exam. A foot X-ray. How is this treated? Treatment for this condition depends on how much pain you have. Treatment options may include: Doing stretching exercises and losing weight, if necessary. Wearing specific shoes or inserts inside of shoes (orthotics) for comfort and support. Wearing splints on your feet while you sleep. Splints keep your feet in a position (usually a 90-degree angle) that should prevent and relieve the pain you feel when you first get out of bed. They also make stretching easier in the morning. Taking over-the-counter medicine to relieve pain, such  as NSAIDs. Using high-intensity sound waves to break up the heel spur (extracorporeal shock wave therapy). Getting steroid injections in your heel to reduce inflammation. Having surgery, if your heel spur causes long-term (chronic) pain. Follow these instructions at home:  Activity Avoid activities that cause pain until you recover, or for as long as told by your health care provider. Do stretching exercises as told. Stretch before exercising or being physically active. Managing pain, stiffness, and swelling If directed, put ice on your foot. To do this: Put ice in a plastic bag. Place a towel between your skin and the bag. Leave the ice on for 20 minutes, 2-3 times a day. Remove the ice if your skin turns bright red. This is very important. If you cannot feel pain, heat, or cold, you have a greater risk of damage to the area. Move your toes often to reduce stiffness and swelling. When possible, raise (elevate) your foot above the level of your heart while you are sitting or lying down. General instructions Take over-the-counter and prescription medicines only as told by your health care provider. Wear supportive shoes that fit well. Wear splints, inserts, or orthotics as told by your health care provider. If recommended, work with your health care provider to lose weight. This can relieve pressure on your foot. Do not use any products that contain nicotine or tobacco, such as cigarettes, e-cigarettes, and chewing tobacco. These can delay bone healing. If you need help quitting, ask your health care provider. Keep all follow-up visits. This is important. Where to find more information [employer]  [employer]: www.orthoinfo.aaos.org Contact a health care provider if: Your pain does not go away with treatment. Your pain gets worse. Summary A heel spur is a bony growth that forms on the bottom of the heel bone (calcaneus). Heel spurs often cause inflammation in the plantar  fascia, which is the band of tissue that connects the toes to the heel bone. This may cause pain on the bottom of the foot, near the heel. Doing stretching exercises, losing weight, wearing specific shoes or shoe inserts, wearing splints while you sleep, and taking pain medicine may ease the pain and stiffness. Other treatment options may include high-intensity sound waves to break up the heel spur, steroid injections, or surgery. This information is not intended to replace advice given to you by your health care provider. Make sure you discuss any questions you have with your health care provider. Document Revised: 09/19/2019 Document Reviewed: 09/19/2019 Elsevier Patient Education  2024 [employer].

## 2023-01-29 NOTE — Progress Notes (Signed)
Subjective:    Patient ID: Rebecca Gardner, female    DOB: [birth year], 27 y.o.   MRN: [id number]  Chief Complaint  Patient presents with   Cyst    Left foot since April painfull   Hair/Scalp Problem    Hair falling out in since June in clumps.    HPI  Pt presents to the office today with left foot pain that started April. She has bought several pairs of shoes without relief. Denies any injury. Has taken tylenol, elevation, and ice without relief.   She reports since June she has noticed increased hair loss since June. Denies recent COVID infection. Reports fatigue.      Review of Systems  All other systems reviewed and are negative.      Objective:   Physical Exam Vitals reviewed.  Constitutional:      General: She is not in acute distress.    Appearance: She is well-developed. She is obese.  HENT:     Head: Normocephalic and atraumatic.     Right Ear: Tympanic membrane normal.     Left Ear: Tympanic membrane normal.  Eyes:     Pupils: Pupils are equal, round, and reactive to light.  Neck:     Thyroid: No thyromegaly.  Cardiovascular:     Rate and Rhythm: Normal rate and regular rhythm.     Heart sounds: Normal heart sounds. No murmur heard. Pulmonary:     Effort: Pulmonary effort is normal. No respiratory distress.     Breath sounds: Normal breath sounds. No wheezing.  Abdominal:     General: Bowel sounds are normal. There is no distension.     Palpations: Abdomen is soft.     Tenderness: There is no abdominal tenderness.  Musculoskeletal:        General: Tenderness present. Normal range of motion.     Cervical back: Normal range of motion and neck supple.       Feet:  Feet:     Comments: Tenderness of left heel with palpation  Skin:    General: Skin is warm and dry.  Neurological:     Mental Status: She is alert and oriented to person, place, and time.     Cranial Nerves: No cranial nerve deficit.     Deep Tendon Reflexes: Reflexes are normal and  symmetric.  Psychiatric:        Behavior: Behavior normal.        Thought Content: Thought content normal.        Judgment: Judgment normal.     BP 137/85   Pulse 95   Temp 97.9 F (36.6 C) (Temporal)   Ht 5\' 8"  (1.727 m)   Wt (!) 335 lb 6.4 oz (152.1 kg)   SpO2 95%   BMI 51.00 kg/m      Assessment & Plan:   Rebecca Gardner comes in today with chief complaint of Cyst (Left foot since April painfull) and Hair/Scalp Problem (Hair falling out in since June in clumps.)   Diagnosis and orders addressed:  1. Hair loss - Anemia Profile B - TSH - CMP14+EGFR  2. Morbid obesity (HCC) - CMP14+EGFR  3. Other fatigue - Anemia Profile B - TSH - CMP14+EGFR  4. Pain of left heel Start diclofenac BID with food No other NSAID's  Stretches  Use frozen water bottle  - DG Foot Complete Left; Future - diclofenac (VOLTAREN) 75 MG EC tablet; Take 1 tablet (75 mg total) by mouth 2 (two) times daily.  Dispense: 60 tablet; Refill: 2  Labs pending Health Maintenance reviewed Diet and exercise encouraged  Follow up plan: Keep chronic follow up   Jannifer Rodney, FNP

## 2023-01-30 LAB — ANEMIA PROFILE B
Basophils Absolute: 0 10*3/uL (ref 0.0–0.2)
Basos: 0 %
EOS (ABSOLUTE): 0.1 10*3/uL (ref 0.0–0.4)
Eos: 1 %
Ferritin: 26 ng/mL (ref 15–150)
Folate: 3.6 ng/mL (ref >3.0)
Hematocrit: 39.2 % (ref 34.0–46.6)
Hemoglobin: 12.7 g/dL (ref 11.1–15.9)
Immature Grans (Abs): 0 10*3/uL (ref 0.0–0.1)
Immature Granulocytes: 0 %
Iron Saturation: 12 % — ABNORMAL LOW (ref 15–55)
Iron: 49 ug/dL (ref 27–159)
Lymphocytes Absolute: 2.8 10*3/uL (ref 0.7–3.1)
Lymphs: 27 %
MCH: 26.7 pg (ref 26.6–33.0)
MCHC: 32.4 g/dL (ref 31.5–35.7)
MCV: 82 fL (ref 79–97)
Monocytes Absolute: 0.4 10*3/uL (ref 0.1–0.9)
Monocytes: 4 %
Neutrophils Absolute: 7 10*3/uL (ref 1.4–7.0)
Neutrophils: 68 %
Platelets: 315 10*3/uL (ref 150–450)
RBC: 4.76 x10E6/uL (ref 3.77–5.28)
RDW: 13.9 % (ref 11.7–15.4)
Retic Ct Pct: 1.3 % (ref 0.6–2.6)
Total Iron Binding Capacity: 404 ug/dL (ref 250–450)
UIBC: 355 ug/dL (ref 131–425)
Vitamin B-12: 218 pg/mL — ABNORMAL LOW (ref 232–1245)
WBC: 10.5 10*3/uL (ref 3.4–10.8)

## 2023-01-30 LAB — CMP14+EGFR
ALT: 29 IU/L (ref 0–32)
AST: 21 IU/L (ref 0–40)
Albumin: 4 g/dL (ref 4.0–5.0)
Alkaline Phosphatase: 63 IU/L (ref 44–121)
BUN/Creatinine Ratio: 14 (ref 9–23)
BUN: 9 mg/dL (ref 6–20)
Bilirubin Total: <0.2 mg/dL (ref 0.0–1.2)
CO2: 21 mmol/L (ref 20–29)
Calcium: 9.5 mg/dL (ref 8.7–10.2)
Chloride: 104 mmol/L (ref 96–106)
Creatinine, Ser: 0.64 mg/dL (ref 0.57–1.00)
Globulin, Total: 2.9 g/dL (ref 1.5–4.5)
Glucose: 79 mg/dL (ref 70–99)
Potassium: 4.7 mmol/L (ref 3.5–5.2)
Sodium: 137 mmol/L (ref 134–144)
Total Protein: 6.9 g/dL (ref 6.0–8.5)
eGFR: 124 mL/min/{1.73_m2} (ref >59)

## 2023-01-30 LAB — TSH: TSH: 2.79 u[IU]/mL (ref 0.450–4.500)

## 2023-02-02 ENCOUNTER — Other Ambulatory Visit (HOSPITAL_COMMUNITY): Payer: Self-pay

## 2023-02-02 MED ORDER — CYANOCOBALAMIN 1000 MCG/ML IJ SOLN
INTRAMUSCULAR | 2 refills | Status: DC
  Filled 2023-02-02: qty 18, 84d supply, fill #0
  Filled 2023-04-22 – 2023-07-10 (×2): qty 18, 84d supply, fill #1

## 2023-02-02 NOTE — Addendum Note (Signed)
Addended by: Jannifer Rodney A on: 02/02/2023 10:18 AM   Modules accepted: Orders

## 2023-02-03 ENCOUNTER — Ambulatory Visit (INDEPENDENT_AMBULATORY_CARE_PROVIDER_SITE_OTHER): Payer: Commercial Managed Care - PPO

## 2023-02-03 DIAGNOSIS — M79672 Pain in left foot: Secondary | ICD-10-CM

## 2023-02-03 DIAGNOSIS — M7732 Calcaneal spur, left foot: Secondary | ICD-10-CM | POA: Diagnosis not present

## 2023-02-09 DIAGNOSIS — M7730 Calcaneal spur, unspecified foot: Secondary | ICD-10-CM

## 2023-02-11 ENCOUNTER — Other Ambulatory Visit: Payer: Self-pay | Admitting: Family

## 2023-02-11 NOTE — Addendum Note (Signed)
Addended by: Jannifer Rodney A on: 02/11/2023 04:13 PM   Modules accepted: Orders

## 2023-02-24 ENCOUNTER — Encounter: Payer: Self-pay | Admitting: Podiatry

## 2023-02-24 ENCOUNTER — Ambulatory Visit: Payer: Commercial Managed Care - PPO | Admitting: Podiatry

## 2023-02-24 ENCOUNTER — Ambulatory Visit (INDEPENDENT_AMBULATORY_CARE_PROVIDER_SITE_OTHER): Payer: Commercial Managed Care - PPO

## 2023-02-24 DIAGNOSIS — M7732 Calcaneal spur, left foot: Secondary | ICD-10-CM | POA: Diagnosis not present

## 2023-02-24 DIAGNOSIS — M722 Plantar fascial fibromatosis: Secondary | ICD-10-CM

## 2023-02-24 MED ORDER — TRIAMCINOLONE ACETONIDE 10 MG/ML IJ SUSP
10.0000 mg | Freq: Once | INTRAMUSCULAR | Status: AC
  Administered 2023-02-24: 10 mg via INTRA_ARTICULAR

## 2023-02-24 MED ORDER — DICLOFENAC SODIUM 75 MG PO TBEC: 75.0000 mg | DELAYED_RELEASE_TABLET | Freq: Two times a day (BID) | ORAL | 2 refills | Status: DC

## 2023-02-24 NOTE — Progress Notes (Signed)
Subjective:   Patient ID: Rebecca Gardner, female   DOB: 27 y.o.   MRN: [id number]   HPI Patient presents with severe pain in the left arch and states it has been very sore and hard to walk with.  States that it has been going on now for about 4 to 6 months and she does not remember injury with obesity is complicating factor.  Patient does not smoke is active works long shifts on her feet   Review of Systems  All other systems reviewed and are negative.       Objective:  Physical Exam Vitals and nursing note reviewed.  Constitutional:      Appearance: She is well-developed.  Pulmonary:     Effort: Pulmonary effort is normal.  Musculoskeletal:        General: Normal range of motion.  Skin:    General: Skin is warm.  Neurological:     Mental Status: She is alert.     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  Reappoint to recheck all questions answered  X-rays indicate no signs of fracture no signs of spurring appears to be soft tissue inflammation

## 2023-02-24 NOTE — Patient Instructions (Signed)

## 2023-03-22 ENCOUNTER — Encounter: Payer: Self-pay | Admitting: Podiatry

## 2023-03-22 ENCOUNTER — Ambulatory Visit: Payer: Commercial Managed Care - PPO | Admitting: Podiatry

## 2023-03-22 DIAGNOSIS — M722 Plantar fascial fibromatosis: Secondary | ICD-10-CM

## 2023-03-22 MED ORDER — TRIAMCINOLONE ACETONIDE 10 MG/ML IJ SUSP
10.0000 mg | Freq: Once | INTRAMUSCULAR | Status: AC
  Administered 2023-03-22: 10 mg via INTRA_ARTICULAR

## 2023-03-24 NOTE — Progress Notes (Signed)
Subjective:   Patient ID: Rebecca Gardner, female   DOB: 27 y.o.   MRN: [id number]   HPI Patient states she is improving but still has a area of pain in the plantar heel with some swelling   ROS      Objective:  Physical Exam  Neurovascular status intact inflammation pain still noted in the medial band of the plantar fascia at the insertion to calcaneus with moderate depression of the arch     Assessment:  Improvement Planter fasciitis left but still present with depressed arch with obesity is complicating factor     Plan:  H&P reviewed and I went ahead today did sterile prep and injected the plantar fascia at insertion 3 mg Kenalog 5 mg Xylocaine and applied fascial brace to lift up the arch and take all pressure off the medial arch area

## 2023-03-29 ENCOUNTER — Ambulatory Visit: Payer: Commercial Managed Care - PPO | Admitting: Podiatry

## 2023-04-19 ENCOUNTER — Ambulatory Visit: Payer: Commercial Managed Care - PPO | Admitting: Podiatry

## 2023-04-19 ENCOUNTER — Encounter: Payer: Self-pay | Admitting: Podiatry

## 2023-04-19 VITALS — Ht 68.0 in | Wt 335.0 lb

## 2023-04-19 DIAGNOSIS — M722 Plantar fascial fibromatosis: Secondary | ICD-10-CM

## 2023-04-19 NOTE — Progress Notes (Signed)
Patient was seen, measured / scanned for custom molded foot orthotics. HX of Plantar Fasciitis  Patient will benefit from CFO's as they will help provide total contact to MLA's helping to better distribute body weight across BIL feet greater reducing plantar pressure and pain and to also encourage FF and RF alignment.  Patient was scanned items to be ordered and fit when in  Seen post visit with Dr. Charlsie Merles today  Addison Bailey CPed, CFo, CFm

## 2023-04-19 NOTE — Progress Notes (Signed)
Subjective:   Patient ID: Rebecca Gardner, female   DOB: 27 y.o.   MRN: [id number]   HPI Patient presents stating heel pain is improving but I do not get any long-term support and the brace is helping but it will not last forever neurovasc   ROS      Objective:  Physical Exam  Her status intact with carotic discomfort in the plantar heel left at the insertion of the tendon calcaneus with obesity is complicating factor     Assessment:  Acute plantar fasciitis left improving present     Plan:  Reviewed condition and foot structure recommended long-term orthotics to lift up the arch is properly given the flatness of the arch and patient's casted for functional orthotic devices by pedorthist.  We will get those to her she will be seen by me as symptoms indicate and we discussed shoe gear modification and continued stretching exercises

## 2023-04-22 ENCOUNTER — Other Ambulatory Visit: Payer: Self-pay

## 2023-04-22 ENCOUNTER — Encounter: Payer: Self-pay | Admitting: Pharmacist

## 2023-04-22 ENCOUNTER — Other Ambulatory Visit (HOSPITAL_COMMUNITY): Payer: Self-pay

## 2023-04-22 DIAGNOSIS — Z30011 Encounter for initial prescription of contraceptive pills: Secondary | ICD-10-CM

## 2023-04-22 MED ORDER — NORGESTIMATE-ETH ESTRADIOL 0.25-35 MG-MCG PO TABS
1.0000 | ORAL_TABLET | Freq: Every day | ORAL | 1 refills | Status: DC
  Filled 2023-04-22 (×2): qty 84, 84d supply, fill #0
  Filled 2023-07-10 – 2023-11-21 (×2): qty 84, 84d supply, fill #1

## 2023-04-27 ENCOUNTER — Other Ambulatory Visit: Payer: Self-pay

## 2023-07-05 ENCOUNTER — Telehealth: Payer: Self-pay | Admitting: Internal Medicine

## 2023-07-05 DIAGNOSIS — J40 Bronchitis, not specified as acute or chronic: Secondary | ICD-10-CM

## 2023-07-05 MED ORDER — AMOXICILLIN-POT CLAVULANATE 875-125 MG PO TABS: 1.0000 | ORAL_TABLET | Freq: Two times a day (BID) | ORAL | 0 refills | Status: DC

## 2023-07-05 MED ORDER — PREDNISONE 20 MG PO TABS: 40.0000 mg | ORAL_TABLET | Freq: Every day | ORAL | 0 refills | Status: AC

## 2023-07-05 MED ORDER — ONDANSETRON 4 MG PO TBDP: 4.0000 mg | ORAL_TABLET | Freq: Three times a day (TID) | ORAL | 0 refills | Status: DC | PRN

## 2023-07-05 NOTE — Telephone Encounter (Signed)
Flu like illness along with sinus and bronchospasm sx Flu swab neg Augmentin+prednisone has worked in past Told to RTC if this does not knock it out

## 2023-07-05 NOTE — Addendum Note (Signed)
Addended by: Levon Hedger on: 07/05/2023 07:44 AM   Modules accepted: Orders

## 2023-07-12 ENCOUNTER — Other Ambulatory Visit: Payer: Self-pay

## 2023-07-14 ENCOUNTER — Other Ambulatory Visit: Payer: Self-pay

## 2023-07-19 ENCOUNTER — Other Ambulatory Visit: Payer: Self-pay

## 2023-09-05 ENCOUNTER — Emergency Department (HOSPITAL_BASED_OUTPATIENT_CLINIC_OR_DEPARTMENT_OTHER)
Admission: EM | Admit: 2023-09-05 | Discharge: 2023-09-05 | Disposition: A | Discharge status: Home / Self Care | Attending: Emergency Medicine | Admitting: Emergency Medicine

## 2023-09-05 ENCOUNTER — Emergency Department (HOSPITAL_BASED_OUTPATIENT_CLINIC_OR_DEPARTMENT_OTHER)

## 2023-09-05 ENCOUNTER — Encounter (HOSPITAL_BASED_OUTPATIENT_CLINIC_OR_DEPARTMENT_OTHER): Payer: Self-pay

## 2023-09-05 ENCOUNTER — Other Ambulatory Visit: Payer: Self-pay

## 2023-09-05 DIAGNOSIS — R1084 Generalized abdominal pain: Secondary | ICD-10-CM

## 2023-09-05 DIAGNOSIS — R109 Unspecified abdominal pain: Secondary | ICD-10-CM | POA: Diagnosis not present

## 2023-09-05 DIAGNOSIS — N200 Calculus of kidney: Secondary | ICD-10-CM

## 2023-09-05 DIAGNOSIS — M545 Low back pain, unspecified: Secondary | ICD-10-CM | POA: Diagnosis not present

## 2023-09-05 DIAGNOSIS — N132 Hydronephrosis with renal and ureteral calculous obstruction: Secondary | ICD-10-CM | POA: Insufficient documentation

## 2023-09-05 DIAGNOSIS — I10 Essential (primary) hypertension: Secondary | ICD-10-CM | POA: Insufficient documentation

## 2023-09-05 LAB — CBC
HCT: 38.9 % (ref 36.0–46.0)
Hemoglobin: 13 g/dL (ref 12.0–15.0)
MCH: 26.6 pg (ref 26.0–34.0)
MCHC: 33.4 g/dL (ref 30.0–36.0)
MCV: 79.6 fL — ABNORMAL LOW (ref 80.0–100.0)
Platelets: 303 10*3/uL (ref 150–400)
RBC: 4.89 MIL/uL (ref 3.87–5.11)
RDW: 14 % (ref 11.5–15.5)
WBC: 13.1 10*3/uL — ABNORMAL HIGH (ref 4.0–10.5)
nRBC: 0 % (ref 0.0–0.2)

## 2023-09-05 LAB — URINALYSIS, ROUTINE W REFLEX MICROSCOPIC
Bilirubin Urine: NEGATIVE
Glucose, UA: NEGATIVE mg/dL
Ketones, ur: NEGATIVE mg/dL
Nitrite: NEGATIVE
RBC / HPF: >50 RBC/hpf (ref 0–5)
Specific Gravity, Urine: 1.024 (ref 1.005–1.030)
pH: 5.5 (ref 5.0–8.0)

## 2023-09-05 LAB — COMPREHENSIVE METABOLIC PANEL WITH GFR
ALT: 21 U/L (ref 0–44)
AST: 17 U/L (ref 15–41)
Albumin: 4.2 g/dL (ref 3.5–5.0)
Alkaline Phosphatase: 52 U/L (ref 38–126)
Anion gap: 10 (ref 5–15)
BUN: 12 mg/dL (ref 6–20)
CO2: 20 mmol/L — ABNORMAL LOW (ref 22–32)
Calcium: 9.3 mg/dL (ref 8.9–10.3)
Chloride: 105 mmol/L (ref 98–111)
Creatinine, Ser: 0.76 mg/dL (ref 0.44–1.00)
GFR, Estimated: >60 mL/min (ref >60)
Glucose, Bld: 103 mg/dL — ABNORMAL HIGH (ref 70–99)
Potassium: 3.6 mmol/L (ref 3.5–5.1)
Sodium: 135 mmol/L (ref 135–145)
Total Bilirubin: 0.4 mg/dL (ref 0.0–1.2)
Total Protein: 7.9 g/dL (ref 6.5–8.1)

## 2023-09-05 LAB — HCG, SERUM, QUALITATIVE: Preg, Serum: NEGATIVE

## 2023-09-05 MED ORDER — KETOROLAC TROMETHAMINE 15 MG/ML IJ SOLN
15.0000 mg | Freq: Once | INTRAMUSCULAR | Status: AC
  Administered 2023-09-05: 15 mg via INTRAVENOUS
  Filled 2023-09-05: qty 1

## 2023-09-05 MED ORDER — PROMETHAZINE HCL 25 MG PO TABS: 25.0000 mg | ORAL_TABLET | Freq: Four times a day (QID) | ORAL | 0 refills | Status: DC | PRN

## 2023-09-05 MED ORDER — ONDANSETRON HCL 4 MG/2ML IJ SOLN
4.0000 mg | Freq: Once | INTRAMUSCULAR | Status: AC
  Administered 2023-09-05: 4 mg via INTRAVENOUS
  Filled 2023-09-05: qty 2

## 2023-09-05 MED ORDER — TAMSULOSIN HCL 0.4 MG PO CAPS: 0.4000 mg | ORAL_CAPSULE | Freq: Every day | ORAL | 0 refills | Status: DC

## 2023-09-05 MED ORDER — SODIUM CHLORIDE 0.9 % IV BOLUS
1000.0000 mL | Freq: Once | INTRAVENOUS | Status: AC
  Administered 2023-09-05: 1000 mL via INTRAVENOUS

## 2023-09-05 MED ORDER — PROMETHAZINE HCL 25 MG/ML IJ SOLN
INTRAMUSCULAR | Status: AC
  Filled 2023-09-05: qty 1

## 2023-09-05 MED ORDER — SODIUM CHLORIDE 0.9 % IV SOLN: INTRAVENOUS | Status: DC | PRN

## 2023-09-05 MED ORDER — OXYCODONE-ACETAMINOPHEN 5-325 MG PO TABS: 1.0000 | ORAL_TABLET | ORAL | 0 refills | Status: DC | PRN

## 2023-09-05 MED ORDER — SODIUM CHLORIDE 0.9 % IV SOLN
25.0000 mg | Freq: Once | INTRAVENOUS | Status: AC
  Administered 2023-09-05: 25 mg via INTRAVENOUS
  Filled 2023-09-05: qty 1

## 2023-09-05 MED ORDER — MORPHINE SULFATE (PF) 4 MG/ML IV SOLN
4.0000 mg | Freq: Once | INTRAVENOUS | Status: AC
  Administered 2023-09-05: 4 mg via INTRAVENOUS
  Filled 2023-09-05: qty 1

## 2023-09-05 MED ORDER — HYDROMORPHONE HCL 1 MG/ML IJ SOLN
1.0000 mg | Freq: Once | INTRAMUSCULAR | Status: AC
  Administered 2023-09-05: 1 mg via INTRAVENOUS
  Filled 2023-09-05: qty 1
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
## 2023-09-05 NOTE — ED Notes (Signed)
 ED Provider at bedside.

## 2023-09-05 NOTE — Discharge Instructions (Signed)
 Your history, exam, and evaluation are consistent with left-sided kidney stone causing your symptoms.  There is a 5 mm stone so it likely should pass as an outpatient using the pain medicine, nausea medicine, and Flomax as you have passed them in the past however, if symptoms were to change or worsen acutely, we recommend returning to [facility] emergency department for reevaluation and possible involvement with urology.  Please follow-up with [facility] and take the medicines and stay hydrated.  Please rest.  If any symptoms change or worsen acutely, return.

## 2023-09-05 NOTE — ED Provider Notes (Signed)
 7:02 AM Care assumed from Dr. Pilar Plate.  At time of transfer of care, patient is awaiting results of urinalysis to look for infected stone.  She was found a 5 mm proximal stone but was reportedly feeling better.  Anticipate shared decision conversation leading to attempt medical expulsion therapy as an outpatient when urinalysis completed.  7:33 AM CT scan did show a 5 mm stone.  Patient reports she has had multiple kidney stones in the past.  She was given a dose of pain medicine and we will try p.o. challenge.  Her urinalysis came back showing leukocytes and bacteria but no nitrites.  Given lack of preceding dysuria or other infectious symptoms, low suspicion for infected stone.  We will send a culture however.  She is feeling well after passed p.o. challenge and is feeling better, will discharge home with pain medicine, nausea medicine, Flomax, and a work note with urology follow-up.  Patient agrees with this plan.  11:57 AM Patient feeling much better after Phenergan and would like to go home.  Will discharge for outpatient follow-up with [facility] and give her pain medicine, nausea meds, and Flomax she is tolerated in the past.  Will discharge for outpatient medical expulsion therapy.   Clinical Impression: 1. Acute bilateral low back pain without sciatica   2. Generalized abdominal pain   3. Kidney stone     Disposition: Discharge  Condition: Good  I have discussed the results, Dx and Tx plan with the pt(& family if present). He/she/they expressed understanding and agree(s) with the plan. Discharge instructions discussed at great length. Strict return precautions discussed and pt &/or family have verbalized understanding of the instructions. No further questions at time of discharge.    New Prescriptions   OXYCODONE-ACETAMINOPHEN (PERCOCET/ROXICET) 5-325 MG TABLET    Take 1 tablet by mouth every 4 (four) hours as needed for severe pain (pain score 7-10).   PROMETHAZINE  (PHENERGAN) 25 MG TABLET    Take 1 tablet (25 mg total) by mouth every 6 (six) hours as needed for nausea or vomiting.   TAMSULOSIN (FLOMAX) 0.4 MG CAPS CAPSULE    Take 1 capsule (0.4 mg total) by mouth daily.    Follow Up: Junie Spencer, FNP [address] [place] [address] [phone]     [facility] [address] [place] [address] [place] [address] [phone]           Terisha Losasso, Canary Brim, MD 09/05/23 1200

## 2023-09-05 NOTE — ED Triage Notes (Signed)
 Pt presents via POV c/o lower abd pain, lower back pain, and left sided flank pain. Reports previous hx of kidney stones. Denies dysuria or urinary hesitancy.

## 2023-09-06 LAB — URINE CULTURE

## 2023-09-17 ENCOUNTER — Ambulatory Visit (INDEPENDENT_AMBULATORY_CARE_PROVIDER_SITE_OTHER): Admitting: Family

## 2023-09-17 ENCOUNTER — Inpatient Hospital Stay: Admitting: Nurse Practitioner

## 2023-09-17 ENCOUNTER — Encounter: Payer: Self-pay | Admitting: Family

## 2023-09-17 VITALS — BP 131/80 | HR 95 | Temp 97.6°F | Ht 68.0 in | Wt 350.0 lb

## 2023-09-17 DIAGNOSIS — Z6841 Body Mass Index (BMI) 40.0 and over, adult: Secondary | ICD-10-CM | POA: Diagnosis not present

## 2023-09-17 DIAGNOSIS — Z87442 Personal history of urinary calculi: Secondary | ICD-10-CM

## 2023-09-17 DIAGNOSIS — Z09 Encounter for follow-up examination after completed treatment for conditions other than malignant neoplasm: Secondary | ICD-10-CM | POA: Diagnosis not present

## 2023-09-17 DIAGNOSIS — F411 Generalized anxiety disorder: Secondary | ICD-10-CM | POA: Diagnosis not present

## 2023-09-17 MED ORDER — DESVENLAFAXINE SUCCINATE ER 50 MG PO TB24: 50.0000 mg | ORAL_TABLET | Freq: Every day | ORAL | 1 refills | Status: DC

## 2023-09-17 NOTE — Patient Instructions (Addendum)
 Generalized Anxiety Disorder, Adult Generalized anxiety disorder (GAD) is a mental health condition. Unlike normal worries, anxiety related to GAD is not triggered by a specific event. These worries do not fade or get better with time. GAD interferes with relationships, work, and school. GAD symptoms can vary from mild to severe. People with severe GAD can have intense waves of anxiety with physical symptoms that are similar to panic attacks. What are the causes? The exact cause of GAD is not known, but the following are believed to have an impact: Differences in natural brain chemicals. Genes passed down from parents to children. Differences in the way threats are perceived. Development and stress during childhood. Personality. What increases the risk? The following factors may make you more likely to develop this condition: Being female. Having a family history of anxiety disorders. Being very shy. Experiencing very stressful life events, such as the death of a loved one. Having a very stressful family environment. What are the signs or symptoms? People with GAD often worry excessively about many things in their lives, such as their health and family. Symptoms may also include: Mental and emotional symptoms: Worrying excessively about natural disasters. Fear of being late. Difficulty concentrating. Fears that others are judging your performance. Physical symptoms: Fatigue. Headaches, muscle tension, muscle twitches, trembling, or feeling shaky. Feeling like your heart is pounding or beating very fast. Feeling out of breath or like you cannot take a deep breath. Having trouble falling asleep or staying asleep, or experiencing restlessness. Sweating. Nausea, diarrhea, or irritable bowel syndrome (IBS). Behavioral symptoms: Experiencing erratic moods or irritability. Avoidance of new situations. Avoidance of people. Extreme difficulty making decisions. How is this diagnosed? This  condition is diagnosed based on your symptoms and medical history. You will also have a physical exam. Your health care provider may perform tests to rule out other possible causes of your symptoms. To be diagnosed with GAD, a person must have anxiety that: Is out of his or her control. Affects several different aspects of his or her life, such as work and relationships. Causes distress that makes him or her unable to take part in normal activities. Includes at least three symptoms of GAD, such as restlessness, fatigue, trouble concentrating, irritability, muscle tension, or sleep problems. Before your health care provider can confirm a diagnosis of GAD, these symptoms must be present more days than they are not, and they must last for 6 months or longer. How is this treated? This condition may be treated with: Medicine. Antidepressant medicine is usually prescribed for long-term daily control. Anti-anxiety medicines may be added in severe cases, especially when panic attacks occur. Talk therapy (psychotherapy). Certain types of talk therapy can be helpful in treating GAD by providing support, education, and guidance. Options include: Cognitive behavioral therapy (CBT). People learn coping skills and self-calming techniques to ease their physical symptoms. They learn to identify unrealistic thoughts and behaviors and to replace them with more appropriate thoughts and behaviors. Acceptance and commitment therapy (ACT). This treatment teaches people how to be mindful as a way to cope with unwanted thoughts and feelings. Biofeedback. This process trains you to manage your body's response (physiological response) through breathing techniques and relaxation methods. You will work with a therapist while machines are used to monitor your physical symptoms. Stress management techniques. These include yoga, meditation, and exercise. A mental health specialist can help determine which treatment is best for you.  Some people see improvement with one type of therapy. However, other people require  a combination of therapies. Follow these instructions at home: Lifestyle Maintain a consistent routine and schedule. Anticipate stressful situations. Create a plan and allow extra time to work with your plan. Practice stress management or self-calming techniques that you have learned from your therapist or your health care provider. Exercise regularly and spend time outdoors. Eat a healthy diet that includes plenty of vegetables, fruits, whole grains, low-fat dairy products, and lean protein. Do not eat a lot of foods that are high in fat, added sugar, or salt (sodium). Drink plenty of water. Avoid alcohol. Alcohol can increase anxiety. Avoid caffeine and certain over-the-counter cold medicines. These may make you feel worse. Ask your pharmacist which medicines to avoid. General instructions Take over-the-counter and prescription medicines only as told by your health care provider. Understand that you are likely to have setbacks. Accept this and be kind to yourself as you persist to take better care of yourself. Anticipate stressful situations. Create a plan and allow extra time to work with your plan. Recognize and accept your accomplishments, even if you judge them as small. Spend time with people who care about you. Keep all follow-up visits. This is important. Where to find more information [employer] of Mental Health: http://www.maynard.net/ Substance Abuse and Mental Health Services: SkateOasis.com.pt Contact a health care provider if: Your symptoms do not get better. Your symptoms get worse. You have signs of depression, such as: A persistently sad or irritable mood. Loss of enjoyment in activities that used to bring you joy. Change in weight or eating. Changes in sleeping habits. Get help right away if: You have thoughts about hurting yourself or others. If you ever feel like you may hurt  yourself or others, or have thoughts about taking your own life, get help right away. Go to your nearest emergency department or: Call your local emergency services (911 in the [place] Call a suicide crisis helpline, such as the National Suicide Prevention Lifeline at (364)266-1511 or 988 in the [place] This is open 24 hours a day in the [place] If you're a Veteran: Call 988 and press 1. This is open 24 hours a day. Text the [employer] at 531-402-1368. Summary Generalized anxiety disorder (GAD) is a mental health condition that involves worry that is not triggered by a specific event. People with GAD often worry excessively about many things in their lives, such as their health and family. GAD may cause symptoms such as restlessness, trouble concentrating, sleep problems, frequent sweating, nausea, diarrhea, headaches, and trembling or muscle twitching. A mental health specialist can help determine which treatment is best for you. Some people see improvement with one type of therapy. However, other people require a combination of therapies. This information is not intended to replace advice given to you by your health care provider. Make sure you discuss any questions you have with your health care provider.   Kidney Stones  Kidney stones are solid, rock-like deposits that form inside of the kidneys. The kidneys are a pair of organs that make urine. A kidney stone may form in a kidney and move into other parts of the urinary tract, including the tubes that connect the kidneys to the bladder (ureters), the bladder, and the tube that carries urine out of the body (urethra). As the stone moves through these areas, it can cause intense pain and block the flow of urine. Kidney stones are created when high levels of certain minerals are found in the urine. The stones are usually passed out of  the body through urination, but in some cases, medical treatment may be needed to remove them. What are the  causes? Kidney stones may be caused by: A condition in which certain glands produce too much parathyroid hormone (primary hyperparathyroidism), which causes too much calcium buildup in the blood. A buildup of uric acid crystals in the bladder (hyperuricosuria). Uric acid is a chemical that the body produces when you eat certain foods. It usually leaves the body in the urine. Narrowing (stricture) of one or both of the ureters. A kidney blockage that is present at birth (congenital obstruction). Past surgery on the kidney or the ureters. What increases the risk? The following factors may make you more likely to develop this condition: Having had a kidney stone in the past. Having a family history of kidney stones. Not drinking enough water. Eating a diet that is high in protein, salt (sodium), or sugar. Being overweight or obese. What are the signs or symptoms? Symptoms of a kidney stone may include: Pain in the side of the abdomen, right below the ribs (flank pain). Pain usually spreads (radiates) to the groin. Needing to urinate often or urgently. Painful urination. Blood in the urine (hematuria). Nausea. Vomiting. Fever and chills. How is this diagnosed? This condition may be diagnosed based on: Your symptoms and medical history. A physical exam. Blood tests. Urine tests. These may be done before and after the stone passes out of your body through urination. Imaging tests, such as a CT scan, abdominal X-ray, or ultrasound. A procedure to examine the inside of the bladder (cystoscopy). How is this treated? Treatment for kidney stones depends on the size, location, and makeup of the stones. Kidney stones will often pass out of the body through urination. You may need to: Increase your fluid intake to help pass the stone. In some cases, you may be given fluids through an IV and may need to be monitored in the hospital. Take medicine for pain. Make changes in your diet to help  prevent kidney stones from coming back. Sometimes, procedures are needed to remove a kidney stone. This may involve: A procedure to break up kidney stones using: A focused beam of light (laser therapy). Shock waves (extracorporeal shock wave lithotripsy). Surgery to remove kidney stones. This may be needed if you have severe pain or have stones that block your urinary tract. Follow these instructions at home: Medicines Take over-the-counter and prescription medicines only as told by your health care provider. Ask your health care provider if the medicine prescribed to you requires you to avoid driving or using heavy machinery. Eating and drinking Drink enough fluid to keep your urine pale yellow. You may be instructed to drink at least 8-10 glasses of water each day. This will help you pass the kidney stone. If directed, change your diet. This may include: Limiting how much sodium you eat. Eating more fruits and vegetables. Limiting how much animal protein you eat. Animal proteins include red meat, poultry, fish, and eggs. Eating a normal amount of calcium (1,000-1,300 mg per day). Follow instructions from your health care provider about eating or drinking restrictions. General instructions Collect urine samples as told by your health care provider. You may need to collect a urine sample: 24 hours after you pass the stone. 8-12 weeks after you pass the kidney stone, and every 6-12 months after that. Strain your urine every time you urinate, for as long as directed. Use the strainer that your health care provider recommends. Do not throw  out the kidney stone after passing it. Keep the stone so it can be tested by your health care provider. Testing the makeup of your kidney stone may help prevent you from getting kidney stones in the future. Keep all follow-up visits. You may need follow-up X-rays or ultrasounds to make sure that your stone has passed. How is this prevented? To prevent  another kidney stone: Drink enough fluid to keep your urine pale yellow. This is the best way to prevent kidney stones. Eat a healthy diet. Follow recommendations from your health care provider about foods to avoid. Recommendations vary depending on the type of kidney stone that you have. You may be instructed to eat a low-protein diet. Maintain a healthy weight. Where to find more information National Kidney Foundation (NKF): www.kidney.org [facility]): www.urologyhealth.org Contact a health care provider if: You have pain that gets worse or does not get better with medicine. Get help right away if: You have a fever or chills. You develop severe pain. You develop new abdominal pain. You faint. You are unable to urinate. Summary Kidney stones are solid, rock-like deposits that form inside of the kidneys. Kidney stones can cause nausea, vomiting, blood in the urine, abdominal pain, and the urge to urinate often. Treatment for kidney stones depends on the size, location, and makeup of the stones. Kidney stones will often pass out of the body through urination. Kidney stones can be prevented by drinking enough fluids, eating a healthy diet, and maintaining a healthy weight. This information is not intended to replace advice given to you by your health care provider. Make sure you discuss any questions you have with your health care provider. Document Revised: 09/03/2021 Document Reviewed: 09/03/2021 Elsevier Patient Education  2024 Elsevier Inc. Document Revised: 01/07/2023 Document Reviewed: 09/15/2020 Elsevier Patient Education  2024 [employer].

## 2023-09-17 NOTE — Progress Notes (Signed)
 Subjective:    Patient ID: Rebecca Gardner, female    DOB: [birth year], 28 y.o.   MRN: [id number]  Chief Complaint  Patient presents with   ER follow up   Anxiety   Pt presents to the office today for hospital follow up. She went to the ED on 09/05/23 for Flank pain. She was diagnosed with a kidney stone. She passed her stone on 09/14/23 and doing well now. Denies any fever, hematuria, or flank pain   She reports she is having increased anxiety.  Anxiety Presents for follow-up visit. Symptoms include compulsions, excessive worry, insomnia, irritability, nervous/anxious behavior and restlessness. Patient reports no obsessions or suicidal ideas. Primary symptoms comment: parnoid.        Review of Systems  Constitutional:  Positive for irritability.  Psychiatric/Behavioral:  Negative for suicidal ideas. The patient is nervous/anxious and has insomnia.   All other systems reviewed and are negative.   Social History   Socioeconomic History   Marital status: Single    Spouse name: Not on file   Number of children: 0   Years of education: college   Highest education level: Associate degree: occupational, Scientist, product/process development, or vocational program  Occupational History   Occupation: Lawyer  Tobacco Use   Smoking status: Never   Smokeless tobacco: Never  Vaping Use   Vaping status: Never Used  Substance and Sexual Activity   Alcohol use: No   Drug use: No   Sexual activity: Yes    Birth control/protection: Condom, Injection  Other Topics Concern   Not on file  Social History Narrative   Not on file   Social Drivers of Health   Financial Resource Strain: Low Risk  (02/20/2019)   Overall Financial Resource Strain (CARDIA)    Difficulty of Paying Living Expenses: Not hard at all  Food Insecurity: No Food Insecurity (05/08/2022)   Hunger Vital Sign    Worried About Running Out of Food in the Last Year: Never true    Ran Out of Food in the Last Year: Never true   Transportation Needs: No Transportation Needs (05/08/2022)   PRAPARE - Administrator, Civil Service (Medical): No    Lack of Transportation (Non-Medical): No  Physical Activity: Insufficiently Active (02/20/2019)   Exercise Vital Sign    Days of Exercise per Week: 4 days    Minutes of Exercise per Session: 30 min  Stress: No Stress Concern Present (02/20/2019)   [employer] of Occupational Health - Occupational Stress Questionnaire    Feeling of Stress : Not at all  Social Connections: Moderately Integrated (02/20/2019)   Social Connection and Isolation Panel [NHANES]    Frequency of Communication with Friends and Family: Three times a week    Frequency of Social Gatherings with Friends and Family: Three times a week    Attends Religious Services: 1 to 4 times per year    Active Member of Clubs or Organizations: No    Attends Banker Meetings: Never    Marital Status: Living with partner   Family History  Problem Relation Age of Onset   Breast cancer Paternal Grandmother    Healthy Mother    Atrial fibrillation Father    Prostate cancer Paternal Grandfather    Cancer Paternal Grandfather        liver   Migraines Maternal Uncle    COPD Maternal Uncle    Jaundice Daughter         Objective:   Physical Exam  Vitals reviewed.  Constitutional:      General: She is not in acute distress.    Appearance: She is well-developed. She is obese.  HENT:     Head: Normocephalic and atraumatic.     Right Ear: Tympanic membrane normal.     Left Ear: Tympanic membrane normal.  Eyes:     Pupils: Pupils are equal, round, and reactive to light.  Neck:     Thyroid: No thyromegaly.  Cardiovascular:     Rate and Rhythm: Normal rate and regular rhythm.     Heart sounds: Normal heart sounds. No murmur heard. Pulmonary:     Effort: Pulmonary effort is normal. No respiratory distress.     Breath sounds: Normal breath sounds. No wheezing.  Abdominal:      General: Bowel sounds are normal. There is no distension.     Palpations: Abdomen is soft.     Tenderness: There is no abdominal tenderness.  Musculoskeletal:        General: No tenderness. Normal range of motion.     Cervical back: Normal range of motion and neck supple.  Skin:    General: Skin is warm and dry.  Neurological:     Mental Status: She is alert and oriented to person, place, and time.     Cranial Nerves: No cranial nerve deficit.     Deep Tendon Reflexes: Reflexes are normal and symmetric.  Psychiatric:        Behavior: Behavior normal.        Thought Content: Thought content normal.        Judgment: Judgment normal.       BP 131/80   Pulse 95   Temp 97.6 F (36.4 C) (Temporal)   Ht 5\' 8"  (1.727 m)   Wt (!) 350 lb (158.8 kg)   SpO2 95%   BMI 53.22 kg/m      Assessment & Plan:  Dorna A Lui comes in today with chief complaint of ER follow up and Anxiety   Diagnosis and orders addressed:  1. GAD (generalized anxiety disorder) (Primary) Start Pristiq 50 mg  Stress management - desvenlafaxine (PRISTIQ) 50 MG 24 hr tablet; Take 1 tablet (50 mg total) by mouth daily.  Dispense: 90 tablet; Refill: 1  2. Morbid obesity (HCC)  3. History of kidney stones - Ambulatory referral to Urology  4. Hospital discharge follow-up - Ambulatory referral to Urology   Hospital notes reviewed  Referral to Urologists pending, low uric acid diet Start pristiq 50 mg Stress management  Continue current medications  Keep follow up with specialists  Health Maintenance reviewed Diet and exercise encouraged  Return in about 4 weeks (around 10/15/2023), or if symptoms worsen or fail to improve.    Jannifer Rodney, FNP

## 2023-10-28 ENCOUNTER — Telehealth: Payer: Self-pay

## 2023-10-28 NOTE — Telephone Encounter (Signed)
 LVM to schdeule an orthotic PU. They have been sitting here since 04/2023

## 2023-11-10 ENCOUNTER — Other Ambulatory Visit: Payer: Self-pay

## 2023-11-10 ENCOUNTER — Ambulatory Visit: Admitting: Urology

## 2023-11-10 ENCOUNTER — Ambulatory Visit (HOSPITAL_COMMUNITY)
Admission: RE | Admit: 2023-11-10 | Discharge: 2023-11-10 | Disposition: A | Discharge status: Home / Self Care | Source: Ambulatory Visit | Attending: Urology | Admitting: Urology

## 2023-11-10 ENCOUNTER — Encounter: Payer: Self-pay | Admitting: Urology

## 2023-11-10 VITALS — BP 127/84 | HR 96

## 2023-11-10 DIAGNOSIS — N200 Calculus of kidney: Secondary | ICD-10-CM

## 2023-11-10 DIAGNOSIS — N2889 Other specified disorders of kidney and ureter: Secondary | ICD-10-CM | POA: Diagnosis not present

## 2023-11-10 LAB — URINALYSIS, ROUTINE W REFLEX MICROSCOPIC
Bilirubin, UA: NEGATIVE
Glucose, UA: NEGATIVE
Ketones, UA: NEGATIVE
Leukocytes,UA: NEGATIVE
Nitrite, UA: NEGATIVE
Protein,UA: NEGATIVE
Specific Gravity, UA: 1.025 (ref 1.005–1.030)
Urobilinogen, Ur: 0.2 mg/dL (ref 0.2–1.0)
pH, UA: 6 (ref 5.0–7.5)

## 2023-11-10 LAB — MICROSCOPIC EXAMINATION: Bacteria, UA: NONE SEEN

## 2023-11-10 NOTE — Progress Notes (Signed)
 11/10/2023 1:03 PM   Rebecca Gardner Sep 01, 1995 [id number]  Referring provider: Yevette Hem, FNP [address] [place],  [place] [address]  nephrolithiasis   HPI: Rebecca Gardner is a 27yo here for evaluation of nephrolithiasis. She developed left flank pain and presented to the ER 3/30 and was diagnosed with a 6mm left proximal ureteral calculus. She does not know if she passed her calculus. KUb from today shows bilateral renal calculi but no left ureteral calculus. She has not had flank pain in 2 months.  She has multiple immediate family members with hx of stones. This is her 4th stone event. Urine pH is 5.5. No prior metabolic evaluation. She drink 120oz of water daily   PMH: Past Medical History:  Diagnosis Date   Acute lateral meniscus tear of left knee 02/02/2014   Acute medial meniscus tear of left knee    Concussion with brief (less than one hour) loss of consciousness 4-28 15   Depression    Gall stones 03/27/2016   Hypertension    Nephrolithiasis     Surgical History: Past Surgical History:  Procedure Laterality Date   CHOLECYSTECTOMY N/A 04/10/2016   Procedure: LAPAROSCOPIC CHOLECYSTECTOMY;  Surgeon: Derral Flick, MD;  Location: [facility];  Service: General;  Laterality: N/A;   KNEE ARTHROSCOPY WITH LATERAL MENISECTOMY Left 02/02/2014   Procedure: LEFT KNEE ARTHROSCOPY WITH LATERAL MENISECTOMY;  Surgeon: Neville Barbone, MD;  [facility];  Service: Orthopedics;  Laterality: Left;   TONSILLECTOMY      Home Medications:  Allergies as of 11/10/2023   No Known Allergies      Medication List        Accurate as of November 10, 2023  1:03 PM. If you have any questions, ask your nurse or doctor.          cyanocobalamin  1000 MCG/ML injection Commonly known as: VITAMIN B12 Inject 1 mL (1,000 mcg total) into the skin or muscle as directed daily for 7 days, THEN 1 mL (1,000 mcg total) once a week for 30 days, THEN 1 mL (1,000 mcg total)  once a month.   desvenlafaxine  50 MG 24 hr tablet Commonly known as: Pristiq  Take 1 tablet (50 mg total) by mouth daily.   diclofenac  75 MG EC tablet Commonly known as: VOLTAREN  Take 1 tablet (75 mg total) by mouth 2 (two) times daily.   Estarylla  0.25-35 MG-MCG tablet Generic drug: norgestimate -ethinyl estradiol  Take 1 tablet by mouth daily.   hydrochlorothiazide  12.5 MG tablet Commonly known as: HYDRODIURIL  Take 1 tablet (12.5 mg total) by mouth daily.   omeprazole  20 MG capsule Commonly known as: PRILOSEC Take 1 capsule (20 mg total) by mouth 2 (two) times daily before a meal.   ondansetron  4 MG disintegrating tablet Commonly known as: ZOFRAN -ODT Take 1 tablet (4 mg total) by mouth every 8 (eight) hours as needed for nausea or vomiting.   SUMAtriptan  100 MG tablet Commonly known as: IMITREX  TAKE 1 TABLET (100 MG) AS NEEDED FOR MIGRAINE. MAY REPEAT IN 2 HOURS IF HEADACHE PERSISTS OR RECURS.        Allergies: No Known Allergies  Family History: Family History  Problem Relation Age of Onset   Breast cancer Paternal Grandmother    Healthy Mother    Atrial fibrillation Father    Prostate cancer Paternal Grandfather    Cancer Paternal Grandfather        liver   Migraines Maternal Uncle    COPD Maternal Uncle  Jaundice Daughter     Social History:  reports that she has never smoked. She has never used smokeless tobacco. She reports that she does not drink alcohol and does not use drugs.  ROS: All other review of systems were reviewed and are negative except what is noted above in HPI  Physical Exam: BP 127/84   Pulse 96   Constitutional:  Alert and oriented, No acute distress. HEENT: [place] AT, moist mucus membranes.  Trachea midline, no masses. Cardiovascular: No clubbing, cyanosis, or edema. Respiratory: Normal respiratory effort, no increased work of breathing. GI: Abdomen is soft, nontender, nondistended, no abdominal masses GU: No CVA tenderness.  Lymph:  No cervical or inguinal lymphadenopathy. Skin: No rashes, bruises or suspicious lesions. Neurologic: Grossly intact, no focal deficits, moving all 4 extremities. Psychiatric: Normal mood and affect.  Laboratory Data: Lab Results  Component Value Date   WBC 13.1 (H) 09/05/2023   HGB 13.0 09/05/2023   HCT 38.9 09/05/2023   MCV 79.6 (L) 09/05/2023   PLT 303 09/05/2023    Lab Results  Component Value Date   CREATININE 0.76 09/05/2023    No results found for: "PSA"  No results found for: "TESTOSTERONE"  Lab Results  Component Value Date   HGBA1C 5.4 06/10/2017    Urinalysis    Component Value Date/Time   COLORURINE YELLOW 09/05/2023 0716   APPEARANCEUR HAZY (A) 09/05/2023 0716   LABSPEC 1.024 09/05/2023 0716   PHURINE 5.5 09/05/2023 0716   GLUCOSEU NEGATIVE 09/05/2023 0716   HGBUR LARGE (A) 09/05/2023 0716   BILIRUBINUR NEGATIVE 09/05/2023 0716   BILIRUBINUR negative 06/26/2013 1457   KETONESUR NEGATIVE 09/05/2023 0716   PROTEINUR TRACE (A) 09/05/2023 0716   UROBILINOGEN negative 06/26/2013 1457   NITRITE NEGATIVE 09/05/2023 0716   LEUKOCYTESUR SMALL (A) 09/05/2023 0716    Lab Results  Component Value Date   MUCUS negative 06/26/2013   BACTERIA RARE (A) 09/05/2023    Pertinent Imaging: Ct 09/05/23 anmd KUB today: Images reviewed and discussed with the patient Results for orders placed in visit on 04/25/18  DG Abd 1 View  Narrative CLINICAL DATA:  Generalized abdominal pain  EXAM: ABDOMEN - 1 VIEW  COMPARISON:  None.  FINDINGS: Normal bowel gas pattern. Mild stool in the right colon. Normal skeletal structures. Negative for urinary tract calculi. IUD in the left pelvis  IMPRESSION: Negative.   Electronically Signed By: Rebecca Gardner M.D. On: 04/26/2018 08:44  No results found for this or any previous visit.  No results found for this or any previous visit.  No results found for this or any previous visit.  Results for orders placed  during the hospital encounter of 05/24/19  US  Renal  Narrative CLINICAL DATA:  Pregnant patient with right lower flank/back pain. History kidney stones. Past 1. Hematuria. Pregnant patient at [redacted] weeks gestation.  EXAM: RENAL / URINARY TRACT ULTRASOUND COMPLETE  COMPARISON:  Noncontrast abdominal CT 07/30/2018  FINDINGS: Right Kidney:  Renal measurements: 12.1 x 5.3 x 5.4 cm = volume: 180 mL. No visualized shadowing calculus. No hydronephrosis. Echogenicity grossly within normal limits. No obvious renal mass.  Left Kidney:  Renal measurements: 12.7 x 6.5 x 5.7 cm = volume: 246 mL. No visualized shadowing calculi. No hydronephrosis. Renal echogenicity grossly normal. No obvious mass.  Bladder:  Nondistended and not well evaluated.  Other:  Technically limited and difficult exam due to body habitus.  IMPRESSION: 1. No hydronephrosis of either kidney. 2. No calculi demonstrated sonographically, stones on prior abdominal CT  are not visualized.   Electronically Signed By: Chadwick Colonel M.D. On: 05/25/2019 00:40  No results found for this or any previous visit.  No results found for this or any previous visit.  Results for orders placed during the hospital encounter of 09/05/23  CT RENAL STONE STUDY  Narrative CLINICAL DATA:  Abdominal pain and flank pain. Evaluate for kidney stone.  EXAM: CT ABDOMEN AND PELVIS WITHOUT CONTRAST  TECHNIQUE: Multidetector CT imaging of the abdomen and pelvis was performed following the standard protocol without IV contrast.  RADIATION DOSE REDUCTION: This exam was performed according to the departmental dose-optimization program which includes automated exposure control, adjustment of the mA and/or kV according to patient size and/or use of iterative reconstruction technique.  COMPARISON:  05/08/2022  FINDINGS: Lower chest: No acute abnormality.  Hepatobiliary: No focal liver abnormality  identified. Cholecystectomy. No bile duct dilatation.  Pancreas: Unremarkable. No pancreatic ductal dilatation or surrounding inflammatory changes.  Spleen: Normal in size without focal abnormality.  Adrenals/Urinary Tract: Normal adrenal glands. Bilateral nephrolithiasis. Interpolar right kidney stone measures 7 mm. The largest left kidney stone is in the interpolar region measuring 7 mm. There is left-sided hydronephrosis. Within the proximal left ureter just beyond the UPJ there is a stone measuring 5 mm, image 58/4. The urinary bladder appears within normal limits.  Stomach/Bowel: Stomach is normal. No pathologic dilatation of the large or small bowel loops. No bowel wall thickening or inflammatory changes.  Vascular/Lymphatic: No significant vascular findings are present. No enlarged abdominal or pelvic lymph nodes.  Reproductive: Uterus and bilateral adnexa are unremarkable.  Other: No free fluid or fluid collections. The no signs of pneumoperitoneum.  Musculoskeletal: No acute or significant osseous findings.  IMPRESSION: 1. Left-sided hydronephrosis secondary to proximal left ureteral stone measuring 5 mm. 2. Bilateral nephrolithiasis.   Electronically Signed By: Kimberley Penman M.D. On: 09/05/2023 06:41   Assessment & Plan:    1. Kidney stones (Primary) We will proceed with metabolic evaluation - Urinalysis, Routine w reflex microscopic   No follow-ups on file.  Johnie Nailer, MD  [facility] [place]

## 2023-11-10 NOTE — Patient Instructions (Signed)

## 2023-11-12 LAB — PTH, INTACT AND CALCIUM: PTH: 38 pg/mL (ref 15–65)

## 2023-11-12 LAB — BASIC METABOLIC PANEL WITH GFR
BUN/Creatinine Ratio: 13 (ref 9–23)
BUN: 9 mg/dL (ref 6–20)
CO2: 21 mmol/L (ref 20–29)
Calcium: 9.2 mg/dL (ref 8.7–10.2)
Chloride: 103 mmol/L (ref 96–106)
Creatinine, Ser: 0.69 mg/dL (ref 0.57–1.00)
Glucose: 111 mg/dL — ABNORMAL HIGH (ref 70–99)
Potassium: 4.2 mmol/L (ref 3.5–5.2)
Sodium: 139 mmol/L (ref 134–144)
eGFR: 121 mL/min/{1.73_m2} (ref >59)

## 2023-11-12 LAB — URIC ACID: Uric Acid: 6.5 mg/dL — ABNORMAL HIGH (ref 2.6–6.2)

## 2023-11-16 ENCOUNTER — Ambulatory Visit: Payer: Self-pay

## 2023-11-19 ENCOUNTER — Encounter: Payer: Self-pay | Admitting: Family

## 2023-11-19 ENCOUNTER — Ambulatory Visit (INDEPENDENT_AMBULATORY_CARE_PROVIDER_SITE_OTHER): Admitting: Family

## 2023-11-19 VITALS — BP 134/81 | HR 95 | Temp 97.8°F | Ht 68.0 in | Wt 358.4 lb

## 2023-11-19 DIAGNOSIS — R197 Diarrhea, unspecified: Secondary | ICD-10-CM | POA: Diagnosis not present

## 2023-11-19 DIAGNOSIS — R140 Abdominal distension (gaseous): Secondary | ICD-10-CM | POA: Diagnosis not present

## 2023-11-19 DIAGNOSIS — F411 Generalized anxiety disorder: Secondary | ICD-10-CM

## 2023-11-19 MED ORDER — HYDROXYZINE PAMOATE 25 MG PO CAPS: 25.0000 mg | ORAL_CAPSULE | Freq: Three times a day (TID) | ORAL | 1 refills | Status: DC | PRN

## 2023-11-19 MED ORDER — DESVENLAFAXINE SUCCINATE ER 100 MG PO TB24: 100.0000 mg | ORAL_TABLET | Freq: Every day | ORAL | 1 refills | Status: DC

## 2023-11-19 NOTE — Progress Notes (Signed)
 Subjective:    Patient ID: Rebecca Gardner, female    DOB: March 24, 1996, 28 y.o.   MRN: [id number]  Chief Complaint  Patient presents with   Follow-up   PT presents to the office today to follow up on anxiety. She was started on Pristiq  50 mg. Reports she can not tell much of a difference.   She reports diarrhea and abdominal bloating after eating bread. Wants to be tested for celiac disease.  Anxiety Presents for follow-up visit. Symptoms include compulsions, excessive worry, irritability, nervous/anxious behavior and restlessness. Symptoms occur most days. The severity of symptoms is moderate.        Review of Systems  Constitutional:  Positive for irritability.  Psychiatric/Behavioral:  The patient is nervous/anxious.   All other systems reviewed and are negative.   Social History   Socioeconomic History   Marital status: Single    Spouse name: Not on file   Number of children: 0   Years of education: college   Highest education level: Associate degree: occupational, Scientist, product/process development, or vocational program  Occupational History   Occupation: Lawyer  Tobacco Use   Smoking status: Never   Smokeless tobacco: Never  Vaping Use   Vaping status: Never Used  Substance and Sexual Activity   Alcohol use: No   Drug use: No   Sexual activity: Yes    Birth control/protection: Condom, Injection  Other Topics Concern   Not on file  Social History Narrative   Not on file   Social Drivers of Health   Financial Resource Strain: Low Risk  (02/20/2019)   Overall Financial Resource Strain (CARDIA)    Difficulty of Paying Living Expenses: Not hard at all  Food Insecurity: No Food Insecurity (05/08/2022)   Hunger Vital Sign    Worried About Running Out of Food in the Last Year: Never true    Ran Out of Food in the Last Year: Never true  Transportation Needs: No Transportation Needs (05/08/2022)   PRAPARE - Administrator, Civil Service (Medical): No    Lack of  Transportation (Non-Medical): No  Physical Activity: Insufficiently Active (02/20/2019)   Exercise Vital Sign    Days of Exercise per Week: 4 days    Minutes of Exercise per Session: 30 min  Stress: No Stress Concern Present (02/20/2019)   [employer] of Occupational Health - Occupational Stress Questionnaire    Feeling of Stress : Not at all  Social Connections: Moderately Integrated (02/20/2019)   Social Connection and Isolation Panel    Frequency of Communication with Friends and Family: Three times a week    Frequency of Social Gatherings with Friends and Family: Three times a week    Attends Religious Services: 1 to 4 times per year    Active Member of Clubs or Organizations: No    Attends Banker Meetings: Never    Marital Status: Living with partner   Family History  Problem Relation Age of Onset   Breast cancer Paternal Grandmother    Healthy Mother    Atrial fibrillation Father    Prostate cancer Paternal Grandfather    Cancer Paternal Grandfather        liver   Migraines Maternal Uncle    COPD Maternal Uncle    Jaundice Daughter         Objective:   Physical Exam Vitals reviewed.  Constitutional:      General: She is not in acute distress.    Appearance: She is well-developed.  She is obese.  HENT:     Head: Normocephalic and atraumatic.     Right Ear: Tympanic membrane normal.     Left Ear: Tympanic membrane normal.   Eyes:     Pupils: Pupils are equal, round, and reactive to light.   Neck:     Thyroid : No thyromegaly.   Cardiovascular:     Rate and Rhythm: Normal rate and regular rhythm.     Heart sounds: Normal heart sounds. No murmur heard. Pulmonary:     Effort: Pulmonary effort is normal. No respiratory distress.     Breath sounds: Normal breath sounds. No wheezing.  Abdominal:     General: Bowel sounds are normal. There is no distension.     Palpations: Abdomen is soft.     Tenderness: There is no abdominal tenderness.    Musculoskeletal:        General: No tenderness. Normal range of motion.     Cervical back: Normal range of motion and neck supple.   Skin:    General: Skin is warm and dry.   Neurological:     Mental Status: She is alert and oriented to person, place, and time.     Cranial Nerves: No cranial nerve deficit.     Deep Tendon Reflexes: Reflexes are normal and symmetric.   Psychiatric:        Behavior: Behavior normal.        Thought Content: Thought content normal.        Judgment: Judgment normal.       BP 134/81   Pulse 95   Temp 97.8 F (36.6 C) (Temporal)   Ht 5' 8 (1.727 m)   Wt (!) 358 lb 6.4 oz (162.6 kg)   SpO2 95%   BMI 54.49 kg/m      Assessment & Plan:  Rebecca Gardner comes in today with chief complaint of Follow-up   Diagnosis and orders addressed:  1. GAD (generalized anxiety disorder) (Primary) Will increase Pristiq  to 100 mg from 50 mg Stress management  - desvenlafaxine  (PRISTIQ ) 100 MG 24 hr tablet; Take 1 tablet (100 mg total) by mouth daily.  Dispense: 90 tablet; Refill: 1 - hydrOXYzine (VISTARIL) 25 MG capsule; Take 1 capsule (25 mg total) by mouth every 8 (eight) hours as needed.  Dispense: 90 capsule; Refill: 1  2. Diarrhea, unspecified type Labs pending  - Celiac Disease Panel  3. Abdominal bloating - Celiac Disease Panel   Labs pending Will increase Pristiq  to 100 mg from 50 mg  Stress management  Health Maintenance reviewed Diet and exercise encouraged  Return in about 1 month (around 12/19/2023), or if symptoms worsen or fail to improve.    Tommas Fragmin, FNP

## 2023-11-19 NOTE — Patient Instructions (Signed)

## 2023-11-20 LAB — CELIAC DISEASE PANEL
Endomysial IgA: NEGATIVE
IgA/Immunoglobulin A, Serum: 263 mg/dL (ref 87–352)
Transglutaminase IgA: <2 U/mL (ref 0–3)

## 2023-11-22 ENCOUNTER — Other Ambulatory Visit (HOSPITAL_COMMUNITY): Payer: Self-pay

## 2023-11-22 ENCOUNTER — Ambulatory Visit: Payer: Self-pay | Admitting: Family

## 2023-11-22 ENCOUNTER — Other Ambulatory Visit: Payer: Self-pay

## 2023-11-25 ENCOUNTER — Other Ambulatory Visit (HOSPITAL_COMMUNITY): Payer: Self-pay

## 2023-12-02 ENCOUNTER — Telehealth: Payer: Self-pay

## 2023-12-02 NOTE — Telephone Encounter (Signed)
 LVM to schedule orthotic pu  Balance: $84.14

## 2023-12-14 ENCOUNTER — Other Ambulatory Visit: Payer: Self-pay | Admitting: Family

## 2023-12-14 DIAGNOSIS — F411 Generalized anxiety disorder: Secondary | ICD-10-CM

## 2023-12-21 NOTE — Telephone Encounter (Signed)
 Called to schedule orthotic pu.. VM box is full  3RD ATTEMPT  Balance: $84.14

## 2024-01-21 ENCOUNTER — Telehealth (HOSPITAL_COMMUNITY): Payer: Self-pay

## 2024-01-21 ENCOUNTER — Other Ambulatory Visit (HOSPITAL_BASED_OUTPATIENT_CLINIC_OR_DEPARTMENT_OTHER): Payer: Self-pay

## 2024-01-21 ENCOUNTER — Encounter: Payer: Self-pay | Admitting: Family

## 2024-01-21 ENCOUNTER — Other Ambulatory Visit (HOSPITAL_COMMUNITY): Payer: Self-pay

## 2024-01-21 ENCOUNTER — Ambulatory Visit: Admitting: Family

## 2024-01-21 VITALS — BP 135/83 | HR 92 | Temp 97.0°F | Ht 68.0 in | Wt 358.4 lb

## 2024-01-21 DIAGNOSIS — R5383 Other fatigue: Secondary | ICD-10-CM | POA: Diagnosis not present

## 2024-01-21 DIAGNOSIS — I10 Essential (primary) hypertension: Secondary | ICD-10-CM | POA: Diagnosis not present

## 2024-01-21 DIAGNOSIS — F411 Generalized anxiety disorder: Secondary | ICD-10-CM

## 2024-01-21 DIAGNOSIS — F320 Major depressive disorder, single episode, mild: Secondary | ICD-10-CM | POA: Diagnosis not present

## 2024-01-21 DIAGNOSIS — Z8249 Family history of ischemic heart disease and other diseases of the circulatory system: Secondary | ICD-10-CM | POA: Diagnosis not present

## 2024-01-21 DIAGNOSIS — Z713 Dietary counseling and surveillance: Secondary | ICD-10-CM | POA: Diagnosis not present

## 2024-01-21 MED ORDER — SEMAGLUTIDE-WEIGHT MANAGEMENT 0.5 MG/0.5ML ~~LOC~~ SOAJ
0.5000 mg | SUBCUTANEOUS | 0 refills | Status: DC
  Filled 2024-01-21: qty 2, 28d supply, fill #0

## 2024-01-21 MED ORDER — SEMAGLUTIDE-WEIGHT MANAGEMENT 0.25 MG/0.5ML ~~LOC~~ SOAJ
0.2500 mg | SUBCUTANEOUS | 0 refills | Status: DC
Start: 2024-01-21 — End: 2024-02-18
  Filled 2024-01-21: qty 2, 28d supply, fill #0

## 2024-01-21 NOTE — Telephone Encounter (Signed)
 [employer] does not cover any GLP-1 injectables. They will cover Qsymia, Contrave  and Phentermine with a prior auth, please contact patient and let them know and see where they want to go from here.

## 2024-01-21 NOTE — Patient Instructions (Signed)

## 2024-01-21 NOTE — Telephone Encounter (Signed)
 PA request has been Received. New Encounter has been or will be created for follow up. For additional info see Pharmacy Prior Auth telephone encounter from 01/21/24.

## 2024-01-21 NOTE — Progress Notes (Signed)
 Subjective:    Patient ID: Rebecca Gardner, female    DOB: 10/26/1995, 28 y.o.   MRN: [id number]  Chief Complaint  Patient presents with   Follow-up   Pt presents to the office today for GAD and depression. PT currently taking Pristoq 100 mg. Taking Vistaril  25 mg once a week or as needed.   Requesting weight loss medication. Reports she started low calorie diet and has lost 6 lbs. Reports family hx of cardiovascular diease.  Anxiety Presents for follow-up visit. Symptoms include excessive worry, nervous/anxious behavior and restlessness. Symptoms occur occasionally. The severity of symptoms is mild.    Depression        This is a chronic problem.  The current episode started more than 1 year ago.   Associated symptoms include helplessness, hopelessness, restlessness and sad.  Past treatments include SNRIs - Serotonin and norepinephrine reuptake inhibitors.  Past medical history includes anxiety.   Hypertension This is a chronic problem. The current episode started more than 1 year ago. The problem has been resolved since onset. The problem is controlled. Associated symptoms include anxiety. Pertinent negatives include no malaise/fatigue or peripheral edema. Risk factors for coronary artery disease include obesity.      Review of Systems  Constitutional:  Negative for malaise/fatigue.  Psychiatric/Behavioral:  Positive for depression. The patient is nervous/anxious.   All other systems reviewed and are negative.   Social History   Socioeconomic History   Marital status: Single    Spouse name: Not on file   Number of children: 0   Years of education: college   Highest education level: Associate degree: occupational, Scientist, product/process development, or vocational program  Occupational History   Occupation: Lawyer  Tobacco Use   Smoking status: Never   Smokeless tobacco: Never  Vaping Use   Vaping status: Never Used  Substance and Sexual Activity   Alcohol use: No   Drug use: No    Sexual activity: Yes    Birth control/protection: Condom, Injection  Other Topics Concern   Not on file  Social History Narrative   Not on file   Social Drivers of Health   Financial Resource Strain: Low Risk  (02/20/2019)   Overall Financial Resource Strain (CARDIA)    Difficulty of Paying Living Expenses: Not hard at all  Food Insecurity: No Food Insecurity (05/08/2022)   Hunger Vital Sign    Worried About Running Out of Food in the Last Year: Never true    Ran Out of Food in the Last Year: Never true  Transportation Needs: No Transportation Needs (05/08/2022)   PRAPARE - Administrator, Civil Service (Medical): No    Lack of Transportation (Non-Medical): No  Physical Activity: Insufficiently Active (02/20/2019)   Exercise Vital Sign    Days of Exercise per Week: 4 days    Minutes of Exercise per Session: 30 min  Stress: No Stress Concern Present (02/20/2019)   [employer] of Occupational Health - Occupational Stress Questionnaire    Feeling of Stress : Not at all  Social Connections: Moderately Integrated (02/20/2019)   Social Connection and Isolation Panel    Frequency of Communication with Friends and Family: Three times a week    Frequency of Social Gatherings with Friends and Family: Three times a week    Attends Religious Services: 1 to 4 times per year    Active Member of Clubs or Organizations: No    Attends Banker Meetings: Never    Marital Status:  Living with partner   Family History  Problem Relation Age of Onset   Breast cancer Paternal Grandmother    Healthy Mother    Atrial fibrillation Father    Prostate cancer Paternal Grandfather    Cancer Paternal Grandfather        liver   Migraines Maternal Uncle    COPD Maternal Uncle    Jaundice Daughter         Objective:   Physical Exam Vitals reviewed.  Constitutional:      General: She is not in acute distress.    Appearance: She is well-developed. She is obese.  HENT:      Head: Normocephalic and atraumatic.  Eyes:     Pupils: Pupils are equal, round, and reactive to light.  Neck:     Thyroid : No thyromegaly.  Cardiovascular:     Rate and Rhythm: Normal rate and regular rhythm.     Heart sounds: Normal heart sounds. No murmur heard. Pulmonary:     Effort: Pulmonary effort is normal. No respiratory distress.     Breath sounds: Normal breath sounds. No wheezing.  Abdominal:     General: Bowel sounds are normal. There is no distension.     Palpations: Abdomen is soft.     Tenderness: There is no abdominal tenderness.  Musculoskeletal:        General: No tenderness. Normal range of motion.     Cervical back: Normal range of motion and neck supple.  Skin:    General: Skin is warm and dry.  Neurological:     Mental Status: She is alert and oriented to person, place, and time.     Cranial Nerves: No cranial nerve deficit.     Deep Tendon Reflexes: Reflexes are normal and symmetric.  Psychiatric:        Behavior: Behavior normal.        Thought Content: Thought content normal.        Judgment: Judgment normal.       BP 135/83   Pulse 92   Temp (!) 97 F (36.1 C) (Temporal)   Ht 5' 8 (1.727 m)   Wt (!) 358 lb 6.4 oz (162.6 kg)   SpO2 96%   BMI 54.49 kg/m      Assessment & Plan:  Rebecca Gardner comes in today with chief complaint of Follow-up   Diagnosis and orders addressed:  1. Depression, major, single episode, mild (HCC) (Primary) Continue Pristiq  100 mg  Vistaril  25 mg prn  Stress management   2. GAD (generalized anxiety disorder) Continue Pristiq  100 mg  Vistaril  25 mg prn  Stress management  3. Benign hypertension Will order Wegovy  today Encourage healthy diet and exercise  - semaglutide -weight management (WEGOVY ) 0.25 MG/0.5ML SOAJ SQ injection; Inject 0.25 mg into the skin once a week for 28 days.  Dispense: 2 mL; Refill: 0 - semaglutide -weight management (WEGOVY ) 0.5 MG/0.5ML SOAJ SQ injection; Inject 0.5 mg into the  skin once a week for 28 days.  Dispense: 2 mL; Refill: 0  4. Morbid obesity (HCC) - semaglutide -weight management (WEGOVY ) 0.25 MG/0.5ML SOAJ SQ injection; Inject 0.25 mg into the skin once a week for 28 days.  Dispense: 2 mL; Refill: 0 - semaglutide -weight management (WEGOVY ) 0.5 MG/0.5ML SOAJ SQ injection; Inject 0.5 mg into the skin once a week for 28 days.  Dispense: 2 mL; Refill: 0 - Ambulatory referral to Sleep Studies  5. Weight loss counseling, encounter for - semaglutide -weight management (WEGOVY ) 0.25 MG/0.5ML SOAJ SQ  injection; Inject 0.25 mg into the skin once a week for 28 days.  Dispense: 2 mL; Refill: 0 - semaglutide -weight management (WEGOVY ) 0.5 MG/0.5ML SOAJ SQ injection; Inject 0.5 mg into the skin once a week for 28 days.  Dispense: 2 mL; Refill: 0  6. Other fatigue - Ambulatory referral to Sleep Studies   7. Family history of cardiovascular disorder   Labs pending Continue current medications  Health Maintenance reviewed Diet and exercise encouraged  Return in about 2 months (around 03/22/2024), or if symptoms worsen or fail to improve.    Bari Learn, FNP

## 2024-01-22 DIAGNOSIS — S299XXA Unspecified injury of thorax, initial encounter: Secondary | ICD-10-CM | POA: Diagnosis not present

## 2024-01-22 DIAGNOSIS — R0789 Other chest pain: Secondary | ICD-10-CM | POA: Diagnosis not present

## 2024-01-22 DIAGNOSIS — R030 Elevated blood-pressure reading, without diagnosis of hypertension: Secondary | ICD-10-CM | POA: Diagnosis not present

## 2024-01-22 DIAGNOSIS — S20219A Contusion of unspecified front wall of thorax, initial encounter: Secondary | ICD-10-CM | POA: Diagnosis not present

## 2024-01-24 ENCOUNTER — Other Ambulatory Visit (HOSPITAL_COMMUNITY): Payer: Self-pay

## 2024-01-24 MED ORDER — CONTRAVE 8-90 MG PO TB12
ORAL_TABLET | ORAL | 0 refills | Status: DC
  Filled 2024-01-24 – 2024-01-27 (×2): qty 70, 30d supply, fill #0

## 2024-01-25 ENCOUNTER — Other Ambulatory Visit (HOSPITAL_COMMUNITY): Payer: Self-pay

## 2024-01-25 ENCOUNTER — Telehealth (HOSPITAL_COMMUNITY): Payer: Self-pay | Admitting: Pharmacy Technician

## 2024-01-25 ENCOUNTER — Telehealth (HOSPITAL_COMMUNITY): Payer: Self-pay

## 2024-01-25 NOTE — Telephone Encounter (Signed)
 Pharmacy Patient Advocate Encounter   Received notification from Pt Calls Messages/pt's pharmacy that prior authorization for Contrave  is required/requested.   Insurance verification completed.   The patient is insured through [facility] .   Per test claim: PA required; PA submitted to above mentioned insurance via Latent Key/confirmation #/EOC [id number] Status is pending

## 2024-01-25 NOTE — Telephone Encounter (Signed)
 PA request has been Received. New Encounter has been or will be created for follow up. For additional info see Pharmacy Prior Auth telephone encounter from 01/25/24.

## 2024-01-27 ENCOUNTER — Encounter: Payer: Self-pay | Admitting: Pharmacist

## 2024-01-27 ENCOUNTER — Other Ambulatory Visit (HOSPITAL_COMMUNITY): Payer: Self-pay

## 2024-01-27 ENCOUNTER — Other Ambulatory Visit: Payer: Self-pay

## 2024-01-27 ENCOUNTER — Ambulatory Visit: Payer: Self-pay

## 2024-01-27 NOTE — Telephone Encounter (Signed)
 Pharmacy Patient Advocate Encounter  Received notification from [facility] that Prior Authorization for Contrave  ER 8-90mg  tabs has been APPROVED from 01/26/24 to 02/25/24   PA #/Case ID/Reference #: [id number]  An additional prior auth [id number] has been entered for Contrave  and the request is effective from 02/22/24 until 05/22/24 and is limited to 4 tabs per day.   Approval letter indexed to media tab

## 2024-01-27 NOTE — Telephone Encounter (Signed)
 FYI Only or Action Required?: FYI only for provider.  Patient was last seen in primary care on 01/21/2024 by Lavell Bari LABOR, FNP.  Called Nurse Triage reporting Chest Pain.  Symptoms began several days ago.  Interventions attempted: Prescription medications: Tramadol , diclofenac   and Ice/heat application.  Symptoms are: stable.  Triage Disposition: See PCP When Office is Open (Within 3 Days)  Patient/caregiver understands and will follow disposition?: Yes           Copied from CRM #[id number]. Topic: Clinical - Red Word Triage >> Jan 27, 2024  4:35 PM Mercer PEDLAR wrote: Red Word that prompted transfer to Nurse Triage: Patient had a fall on 01/21/24 and was seen at urgent care where they did x-rays and she was told that nothing appears to be wrong but patient states that she is in severe pain. Reason for Disposition  [1] After 3 days AND [2] chest pain not improved  Answer Assessment - Initial Assessment Questions Chest pain  Answer Assessment - Initial Assessment Questions Pt seen at [facility]  for symptoms and given tramadol  for pain with no relief. Patient states the pain makes it difficult to work. Also taking Diclofenac  as well for symptoms and states the swelling has improved. Also using ice and heat for symptoms.    1. MECHANISM: How did the injury happen?     Fall outside on a slip and slide  2. ONSET: When did the injury happen? (.e.g., minutes, hours, days ago)     01/21/24 3. LOCATION: Where on the chest is the injury located?     L side of chest/ ribs  4. APPEARANCE: What does the injury look like?     Area is swollen, has improved  5. SEVERITY: Any difficulty with breathing?     SOB when walking,difficulty  taking deep breaths 6. PAIN: Is there pain? If Yes, ask: How bad is the pain? (e.g., Scale 0-10; none, mild, moderate, severe)     Pain at a 6 or 7 and worse when getting up  Protocols used: Chest Pain-A-AH, Chest Injury-A-AH

## 2024-01-28 ENCOUNTER — Encounter: Payer: Self-pay | Admitting: Family Medicine

## 2024-01-28 ENCOUNTER — Ambulatory Visit: Admitting: Family Medicine

## 2024-01-28 VITALS — BP 143/99 | HR 93 | Ht 68.0 in | Wt 363.0 lb

## 2024-01-28 DIAGNOSIS — M79672 Pain in left foot: Secondary | ICD-10-CM | POA: Diagnosis not present

## 2024-01-28 DIAGNOSIS — R0781 Pleurodynia: Secondary | ICD-10-CM

## 2024-01-28 DIAGNOSIS — R079 Chest pain, unspecified: Secondary | ICD-10-CM | POA: Diagnosis not present

## 2024-01-28 MED ORDER — DICLOFENAC SODIUM 75 MG PO TBEC: 75.0000 mg | DELAYED_RELEASE_TABLET | Freq: Two times a day (BID) | ORAL | 2 refills | Status: DC

## 2024-01-28 MED ORDER — CYCLOBENZAPRINE HCL 10 MG PO TABS: 10.0000 mg | ORAL_TABLET | Freq: Three times a day (TID) | ORAL | 0 refills | Status: DC | PRN

## 2024-01-28 MED ORDER — TRAMADOL HCL 50 MG PO TABS: 50.0000 mg | ORAL_TABLET | Freq: Two times a day (BID) | ORAL | 0 refills | Status: AC | PRN

## 2024-01-28 NOTE — Telephone Encounter (Signed)
 Appt made.

## 2024-01-28 NOTE — Progress Notes (Signed)
 BP (!) 143/99   Pulse 93   Ht 5' 8 (1.727 m)   Wt (!) 363 lb (164.7 kg)   SpO2 99%   BMI 55.19 kg/m    Subjective:   Patient ID: Rebecca Gardner, female    DOB: [birth year], 28 y.o.   MRN: [id number]  HPI: Rebecca Gardner is a 28 y.o. female presenting on 01/28/2024 for chest soreness (From fall. Right side.) and Rib Injury (And pain from fall. Left side)   Discussed the use of AI scribe software for clinical note transcription with the patient, who gave verbal consent to proceed.  History of Present Illness   Rebecca Gardner is a 28 year old female who presents with chest and rib pain following a fall.  She experienced chest and rib pain after slipping on a slip and slide and landing chest first on hard ground last Friday. The pain is localized to the left side and is severe, causing her to cry when getting out of a chair at work.  She visited urgent care the day after the fall, where a rib x-ray showed no fractures. A chest x-ray could not be completed due to equipment failure. She was prescribed tramadol  and diclofenac , but tramadol  provides minimal relief and diclofenac  has been ineffective. She has also tried Tylenol , ibuprofen , and Aleve  without significant improvement.  The pain has worsened since returning to work on Monday. 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 Deep breaths are particularly painful, and she experiences pain with every breath.  She has been alternating between ice and heat for pain management and continues to take diclofenac  twice daily with food. She has a four-year-old child at home, which necessitates maintaining some level of functionality despite the pain.  No neck pain.          Relevant past medical, surgical, family and social history reviewed and updated as indicated. Interim medical history since our last visit reviewed. Allergies and medications  reviewed and updated.  Review of Systems  Constitutional:  Negative for chills and fever.  Eyes:  Negative for visual disturbance.  Respiratory:  Negative for chest tightness and shortness of breath.   Cardiovascular:  Positive for chest pain. Negative for leg swelling.  Genitourinary:  Negative for difficulty urinating and dysuria.  Musculoskeletal:  Positive for arthralgias and myalgias. Negative for back pain and gait problem.  Skin:  Negative for rash.  Neurological:  Negative for dizziness, light-headedness and headaches.  Psychiatric/Behavioral:  Negative for agitation and behavioral problems.   All other systems reviewed and are negative.   Per HPI unless specifically indicated above   Allergies as of 01/28/2024   No Known Allergies      Medication List        Accurate as of January 28, 2024  2:29 PM. If you have any questions, ask your nurse or doctor.          STOP taking these medications    semaglutide -weight management 0.25 MG/0.5ML Soaj SQ injection Commonly known as: WEGOVY  Stopped by: Rebecca Gardner   semaglutide -weight management 0.5 MG/0.5ML Soaj SQ injection Commonly known as: WEGOVY  Stopped by: Rebecca Gardner       TAKE these medications    Contrave  8-90 MG Tb12 Generic drug: Naltrexone -buPROPion  HCl ER Take 1 tablet by mouth each morning x7 days THEN take 1 tablet twice daily x7 days THEN take 2 tablets in AM and  1 tab in PM x7 days THEN take 2 tabs twice daily from then on   cyanocobalamin  1000 MCG/ML injection Commonly known as: VITAMIN B12 Inject 1 mL (1,000 mcg total) into the skin or muscle as directed daily for 7 days, THEN 1 mL (1,000 mcg total) once a week for 30 days, THEN 1 mL (1,000 mcg total) once a month.   cyclobenzaprine  10 MG tablet Commonly known as: FLEXERIL  Take 1 tablet (10 mg total) by mouth 3 (three) times daily as needed for muscle spasms. Started by: Rebecca Gardner   desvenlafaxine  100 MG 24 hr  tablet Commonly known as: Pristiq  Take 1 tablet (100 mg total) by mouth daily.   diclofenac  75 MG EC tablet Commonly known as: VOLTAREN  Take 1 tablet (75 mg total) by mouth 2 (two) times daily.   hydrochlorothiazide  12.5 MG tablet Commonly known as: HYDRODIURIL  Take 1 tablet (12.5 mg total) by mouth daily.   hydrOXYzine  25 MG capsule Commonly known as: VISTARIL  TAKE 1 CAPSULE (25 MG TOTAL) BY MOUTH EVERY 8 (EIGHT) HOURS AS NEEDED.   omeprazole  20 MG capsule Commonly known as: PRILOSEC Take 1 capsule (20 mg total) by mouth 2 (two) times daily before a meal.   ondansetron  4 MG disintegrating tablet Commonly known as: ZOFRAN -ODT Take 1 tablet (4 mg total) by mouth every 8 (eight) hours as needed for nausea or vomiting.   SUMAtriptan  100 MG tablet Commonly known as: IMITREX  TAKE 1 TABLET (100 MG) AS NEEDED FOR MIGRAINE. MAY REPEAT IN 2 HOURS IF HEADACHE PERSISTS OR RECURS.   traMADol  50 MG tablet Commonly known as: ULTRAM  Take 1 tablet (50 mg total) by mouth every 12 (twelve) hours as needed for up to 7 days. Started by: Rebecca Gardner   VyLibra  0.25-35 MG-MCG tablet Generic drug: norgestimate -ethinyl estradiol  Take 1 tablet by mouth daily.         Objective:   BP (!) 143/99   Pulse 93   Ht 5' 8 (1.727 m)   Wt (!) 363 lb (164.7 kg)   SpO2 99%   BMI 55.19 kg/m   Wt Readings from Last 3 Encounters:  01/28/24 (!) 363 lb (164.7 kg)  01/21/24 (!) 358 lb 6.4 oz (162.6 kg)  11/19/23 (!) 358 lb 6.4 oz (162.6 kg)    Physical Exam Physical Exam   NECK: Neck supple, no tenderness. CHEST: Lungs clear to auscultation bilaterally. Tenderness over left lower ribs and sternum. No bruising on chest. CARDIOVASCULAR: Heart regular rate and rhythm, no murmurs. SKIN: No bruising on chest.  Pain on palpation on left lower rib and sternum      Results for orders placed or performed in visit on 11/19/23  Celiac Disease Panel   Collection Time: 11/19/23 11:09 AM  Result  Value Ref Range   Endomysial IgA Negative Negative   Transglutaminase IgA <2 0 - 3 U/mL   IgA/Immunoglobulin A, Serum 263 87 - 352 mg/dL    Assessment & Plan:   Problem List Items Addressed This Visit   None Visit Diagnoses       Chest pain, unspecified type    -  Primary   Relevant Medications   diclofenac  (VOLTAREN ) 75 MG EC tablet   traMADol  (ULTRAM ) 50 MG tablet   cyclobenzaprine  (FLEXERIL ) 10 MG tablet     Rib pain       Relevant Medications   diclofenac  (VOLTAREN ) 75 MG EC tablet   traMADol  (ULTRAM ) 50 MG tablet   cyclobenzaprine  (FLEXERIL ) 10 MG tablet  Pain of left heel               Suspected left lower rib fracture with chest wall contusion and acute chest pain Acute chest pain and suspected left lower rib fracture post-fall. Pain worsens with deep breathing and movement. Previous x-ray negative for fractures, but hairline fracture possible. Sternum bruised. Pain management difficult with current medications. - Prescribed muscle relaxer, advised caution regarding sedation and impact on driving and childcare. - Continue diclofenac  twice daily with food for inflammation. - Prescribed additional week of tramadol , advised caution with driving. - Recommend pregnancy waistband or pillow for chest support. - Advise alternating ice and heat therapy for pain and inflammation. - Instruct to avoid deep breathing exercises at work, use videos for demonstrations. - Plan recovery period of 2 to 4 weeks.          Follow up plan: Return if symptoms worsen or fail to improve.  Counseling provided for all of the vaccine components No orders of the defined types were placed in this encounter.   Rebecca Levins, MD [facility] 01/28/2024, 2:29 PM

## 2024-02-23 ENCOUNTER — Other Ambulatory Visit (HOSPITAL_COMMUNITY)

## 2024-03-03 ENCOUNTER — Ambulatory Visit: Admitting: Urology

## 2024-03-18 ENCOUNTER — Other Ambulatory Visit: Payer: Self-pay | Admitting: Family

## 2024-03-18 DIAGNOSIS — F411 Generalized anxiety disorder: Secondary | ICD-10-CM

## 2024-03-20 NOTE — Telephone Encounter (Signed)
 30 days sent in - pt is due for follow up

## 2024-03-20 NOTE — Telephone Encounter (Signed)
 Pt says that she has not taken the rx in over a yr.

## 2024-05-03 ENCOUNTER — Emergency Department (HOSPITAL_COMMUNITY)

## 2024-05-03 ENCOUNTER — Emergency Department (HOSPITAL_COMMUNITY)
Admission: EM | Admit: 2024-05-03 | Discharge: 2024-05-03 | Disposition: A | Discharge status: Home / Self Care | Attending: Student in an Organized Health Care Education/Training Program | Admitting: Student in an Organized Health Care Education/Training Program

## 2024-05-03 ENCOUNTER — Other Ambulatory Visit: Payer: Self-pay

## 2024-05-03 ENCOUNTER — Emergency Department (HOSPITAL_COMMUNITY)
Admission: EM | Admit: 2024-05-03 | Discharge: 2024-05-03 | Disposition: A | Discharge status: Home / Self Care | Source: Home / Self Care | Attending: Emergency Medicine | Admitting: Emergency Medicine

## 2024-05-03 ENCOUNTER — Encounter (HOSPITAL_COMMUNITY): Payer: Self-pay

## 2024-05-03 ENCOUNTER — Encounter (HOSPITAL_COMMUNITY): Payer: Self-pay | Admitting: Emergency Medicine

## 2024-05-03 DIAGNOSIS — N132 Hydronephrosis with renal and ureteral calculous obstruction: Secondary | ICD-10-CM | POA: Diagnosis not present

## 2024-05-03 DIAGNOSIS — N23 Unspecified renal colic: Secondary | ICD-10-CM

## 2024-05-03 DIAGNOSIS — R1031 Right lower quadrant pain: Secondary | ICD-10-CM | POA: Diagnosis not present

## 2024-05-03 DIAGNOSIS — I10 Essential (primary) hypertension: Secondary | ICD-10-CM | POA: Insufficient documentation

## 2024-05-03 DIAGNOSIS — Z79899 Other long term (current) drug therapy: Secondary | ICD-10-CM | POA: Diagnosis not present

## 2024-05-03 DIAGNOSIS — N200 Calculus of kidney: Secondary | ICD-10-CM | POA: Diagnosis not present

## 2024-05-03 DIAGNOSIS — D72829 Elevated white blood cell count, unspecified: Secondary | ICD-10-CM | POA: Diagnosis not present

## 2024-05-03 DIAGNOSIS — R000 Tachycardia, unspecified: Secondary | ICD-10-CM | POA: Diagnosis not present

## 2024-05-03 DIAGNOSIS — R109 Unspecified abdominal pain: Secondary | ICD-10-CM | POA: Diagnosis present

## 2024-05-03 DIAGNOSIS — R102 Pelvic and perineal pain unspecified side: Secondary | ICD-10-CM | POA: Diagnosis not present

## 2024-05-03 DIAGNOSIS — N134 Hydroureter: Secondary | ICD-10-CM | POA: Diagnosis not present

## 2024-05-03 DIAGNOSIS — R1011 Right upper quadrant pain: Secondary | ICD-10-CM | POA: Diagnosis not present

## 2024-05-03 LAB — URINALYSIS, ROUTINE W REFLEX MICROSCOPIC
Bacteria, UA: NONE SEEN
Bilirubin Urine: NEGATIVE
Bilirubin Urine: NEGATIVE
Glucose, UA: NEGATIVE mg/dL
Glucose, UA: NEGATIVE mg/dL
Ketones, ur: NEGATIVE mg/dL
Ketones, ur: 5 mg/dL — AB
Leukocytes,Ua: NEGATIVE
Nitrite: NEGATIVE
Nitrite: NEGATIVE
Protein, ur: NEGATIVE mg/dL
Protein, ur: NEGATIVE mg/dL
RBC / HPF: >50 RBC/hpf (ref 0–5)
Specific Gravity, Urine: 1.017 (ref 1.005–1.030)
Specific Gravity, Urine: 1.018 (ref 1.005–1.030)
pH: 5 (ref 5.0–8.0)
pH: 6 (ref 5.0–8.0)

## 2024-05-03 LAB — COMPREHENSIVE METABOLIC PANEL WITH GFR
ALT: 30 U/L (ref 0–44)
AST: 26 U/L (ref 15–41)
Albumin: 3.7 g/dL (ref 3.5–5.0)
Alkaline Phosphatase: 52 U/L (ref 38–126)
Anion gap: 13 (ref 5–15)
BUN: 9 mg/dL (ref 6–20)
CO2: 21 mmol/L — ABNORMAL LOW (ref 22–32)
Calcium: 9.2 mg/dL (ref 8.9–10.3)
Chloride: 103 mmol/L (ref 98–111)
Creatinine, Ser: 0.79 mg/dL (ref 0.44–1.00)
GFR, Estimated: >60 mL/min (ref >60)
Glucose, Bld: 113 mg/dL — ABNORMAL HIGH (ref 70–99)
Potassium: 3.8 mmol/L (ref 3.5–5.1)
Sodium: 137 mmol/L (ref 135–145)
Total Bilirubin: 0.6 mg/dL (ref 0.0–1.2)
Total Protein: 7.7 g/dL (ref 6.5–8.1)

## 2024-05-03 LAB — CBC WITH DIFFERENTIAL/PLATELET
Abs Immature Granulocytes: 0.03 K/uL (ref 0.00–0.07)
Basophils Absolute: 0 K/uL (ref 0.0–0.1)
Basophils Relative: 0 %
Eosinophils Absolute: 0.2 K/uL (ref 0.0–0.5)
Eosinophils Relative: 2 %
HCT: 41.6 % (ref 36.0–46.0)
Hemoglobin: 13.5 g/dL (ref 12.0–15.0)
Immature Granulocytes: 0 %
Lymphocytes Relative: 31 %
Lymphs Abs: 3.6 K/uL (ref 0.7–4.0)
MCH: 26.1 pg (ref 26.0–34.0)
MCHC: 32.5 g/dL (ref 30.0–36.0)
MCV: 80.3 fL (ref 80.0–100.0)
Monocytes Absolute: 0.6 K/uL (ref 0.1–1.0)
Monocytes Relative: 5 %
Neutro Abs: 7.1 K/uL (ref 1.7–7.7)
Neutrophils Relative %: 62 %
Platelets: 354 K/uL (ref 150–400)
RBC: 5.18 MIL/uL — ABNORMAL HIGH (ref 3.87–5.11)
RDW: 13.8 % (ref 11.5–15.5)
WBC: 11.5 K/uL — ABNORMAL HIGH (ref 4.0–10.5)
nRBC: 0 % (ref 0.0–0.2)

## 2024-05-03 LAB — HCG, SERUM, QUALITATIVE: Preg, Serum: NEGATIVE

## 2024-05-03 LAB — LIPASE, BLOOD: Lipase: 26 U/L (ref 11–51)

## 2024-05-03 LAB — POC URINE PREG, ED: Preg Test, Ur: NEGATIVE

## 2024-05-03 MED ORDER — TAMSULOSIN HCL 0.4 MG PO CAPS: 0.4000 mg | ORAL_CAPSULE | Freq: Every day | ORAL | 0 refills | Status: AC

## 2024-05-03 MED ORDER — ONDANSETRON HCL 4 MG/2ML IJ SOLN
4.0000 mg | Freq: Once | INTRAMUSCULAR | Status: AC
  Administered 2024-05-03: 4 mg via INTRAVENOUS
  Filled 2024-05-03: qty 2

## 2024-05-03 MED ORDER — OXYCODONE-ACETAMINOPHEN 5-325 MG PO TABS: 1.0000 | ORAL_TABLET | Freq: Four times a day (QID) | ORAL | 0 refills | Status: AC | PRN

## 2024-05-03 MED ORDER — IBUPROFEN 600 MG PO TABS: 600.0000 mg | ORAL_TABLET | Freq: Four times a day (QID) | ORAL | 0 refills | Status: AC | PRN

## 2024-05-03 MED ORDER — MORPHINE SULFATE (PF) 4 MG/ML IV SOLN
4.0000 mg | Freq: Once | INTRAVENOUS | Status: AC
  Administered 2024-05-03: 4 mg via INTRAVENOUS
  Filled 2024-05-03: qty 1

## 2024-05-03 MED ORDER — ONDANSETRON HCL 4 MG PO TABS: 4.0000 mg | ORAL_TABLET | Freq: Three times a day (TID) | ORAL | 0 refills | Status: AC | PRN

## 2024-05-03 MED ORDER — HYDROMORPHONE HCL 1 MG/ML IJ SOLN
0.5000 mg | Freq: Once | INTRAMUSCULAR | Status: AC
Start: 2024-05-03 — End: 2024-05-03
  Administered 2024-05-03: 0.5 mg via INTRAVENOUS
  Filled 2024-05-03: qty 0.5

## 2024-05-03 MED ORDER — PROMETHAZINE HCL 12.5 MG PO TABS: 12.5000 mg | ORAL_TABLET | Freq: Three times a day (TID) | ORAL | 0 refills | Status: AC | PRN

## 2024-05-03 MED ORDER — OXYCODONE HCL 10 MG PO TABS: 10.0000 mg | ORAL_TABLET | Freq: Four times a day (QID) | ORAL | 0 refills | Status: DC | PRN

## 2024-05-03 MED ORDER — LACTATED RINGERS IV BOLUS
1000.0000 mL | Freq: Once | INTRAVENOUS | Status: AC
  Administered 2024-05-03: 1000 mL via INTRAVENOUS

## 2024-05-03 MED ORDER — ONDANSETRON 4 MG PO TBDP: 4.0000 mg | ORAL_TABLET | Freq: Four times a day (QID) | ORAL | 0 refills | Status: AC | PRN

## 2024-05-03 MED ORDER — KETOROLAC TROMETHAMINE 15 MG/ML IJ SOLN
15.0000 mg | Freq: Once | INTRAMUSCULAR | Status: AC
  Administered 2024-05-03: 15 mg via INTRAVENOUS
  Filled 2024-05-03: qty 1

## 2024-05-03 MED ORDER — DROPERIDOL 2.5 MG/ML IJ SOLN
1.2500 mg | Freq: Once | INTRAMUSCULAR | Status: AC
  Administered 2024-05-03: 1.25 mg via INTRAVENOUS
  Filled 2024-05-03: qty 2

## 2024-05-03 MED ORDER — ACETAMINOPHEN 500 MG PO TABS
1000.0000 mg | ORAL_TABLET | Freq: Once | ORAL | Status: AC
  Administered 2024-05-03: 1000 mg via ORAL
  Filled 2024-05-03: qty 2

## 2024-05-03 NOTE — ED Triage Notes (Signed)
 Pt reports right side flank pain, seen at [facility] this morning and diagnosed with a 7mm stone.  Pt reports she took an oxycodone  and it not having any relief.

## 2024-05-03 NOTE — ED Provider Notes (Addendum)
 [place] EMERGENCY DEPARTMENT AT [facility] Provider Note   CSN: [id number] Arrival date & time: 05/03/24  1734     Patient presents with: Flank Pain   Rebecca Gardner is a 28 y.o. female.  History of kidney stones, obesity, hypertension.  Notes to ER today for right flank pain.  She was seen this morning at [facility] for the same, had a pelvic ultrasound that was negative and CT abdomen pelvis showing right ureteral stone.  She is feeling better after leaving after morphine .  She states when she got home she went to sleep, she woke up with some pain but did not want to take the oxycodone , as she states it makes her constipated.  She reports after couple of hours her pain was no longer bearable so she took one of her oxycodone  and this did not help.  This was about an hour prior to arrival.  She is also having continued nausea.  She is given Zofran  tablets but states that it is not helping, requesting prescription for the ODT Zofran  which she has had in the past as well as Phenergan  which has helped in the past.  Denies dysuria or fever.    Flank Pain       Prior to Admission medications   Medication Sig Start Date End Date Taking? Authorizing Provider  hydrOXYzine  (VISTARIL ) 25 MG capsule TAKE 1 CAPSULE (25 MG TOTAL) BY MOUTH EVERY 8 (EIGHT) HOURS AS NEEDED. Patient taking differently: Take 25 mg by mouth every 8 (eight) hours as needed for anxiety. 03/20/24   Lavell Lye A, FNP  ondansetron  (ZOFRAN ) 4 MG tablet Take 1 tablet (4 mg total) by mouth every 8 (eight) hours as needed for up to 3 days for nausea or vomiting. 05/03/24 05/06/24  Charmayne Holmes, DO  oxyCODONE -acetaminophen  (PERCOCET/ROXICET) 5-325 MG tablet Take 1 tablet by mouth every 6 (six) hours as needed for up to 3 days for severe pain (pain score 7-10). 05/03/24 05/06/24  Charmayne Holmes, DO  tamsulosin  (FLOMAX ) 0.4 MG CAPS capsule Take 1 capsule (0.4 mg total) by mouth daily for 14 days. 05/03/24 05/17/24   Charmayne Holmes, DO    Allergies: Patient has no known allergies.    Review of Systems  Genitourinary:  Positive for flank pain.    Updated Vital Signs BP (!) 161/112 (BP Location: Left Wrist)   Pulse 98   Temp 97.8 F (36.6 C) (Oral)   Resp 20   Wt (!) 164 kg   LMP 04/18/2024 (Approximate)   SpO2 99%   BMI 54.97 kg/m   Physical Exam Vitals and nursing note reviewed.  Constitutional:      General: She is not in acute distress.    Appearance: She is well-developed.     Comments: Sitting up, appears very uncomfortable, though nontoxic  HENT:     Head: Normocephalic and atraumatic.  Eyes:     Conjunctiva/sclera: Conjunctivae normal.  Cardiovascular:     Rate and Rhythm: Normal rate and regular rhythm.     Heart sounds: No murmur heard. Pulmonary:     Effort: Pulmonary effort is normal. No respiratory distress.     Breath sounds: Normal breath sounds.  Abdominal:     Palpations: Abdomen is soft.     Tenderness: There is no abdominal tenderness.  Musculoskeletal:        General: No swelling.     Cervical back: Neck supple.  Skin:    General: Skin is warm and dry.  Capillary Refill: Capillary refill takes less than 2 seconds.  Neurological:     General: No focal deficit present.     Mental Status: She is alert and oriented to person, place, and time.     Sensory: No sensory deficit.     Motor: No weakness.  Psychiatric:        Mood and Affect: Mood normal.     (all labs ordered are listed, but only abnormal results are displayed) Labs Reviewed - No data to display  EKG: None  Radiology: CT ABDOMEN PELVIS WO CONTRAST Result Date: 05/03/2024 EXAM: CT ABDOMEN AND PELVIS WITHOUT CONTRAST 05/03/2024 10:07:14 AM TECHNIQUE: CT of the abdomen and pelvis was performed without the administration of intravenous contrast. Multiplanar reformatted images are provided for review. Automated exposure control, iterative reconstruction, and/or weight-based adjustment of the  mA/kV was utilized to reduce the radiation dose to as low as reasonably achievable. COMPARISON: 09/05/2023 CLINICAL HISTORY: Right lower quadrant abdominal pain and dysuria. FINDINGS: LOWER CHEST: No acute abnormality. LIVER: Diffuse hepatic steatosis with suspected fatty sparing posteriorly in segment 4. GALLBLADDER AND BILE DUCTS: Gallbladder is unremarkable. No biliary ductal dilatation. SPLEEN: No acute abnormality. PANCREAS: No acute abnormality. ADRENAL GLANDS: No acute abnormality. KIDNEYS, URETERS AND BLADDER: Mild to moderate right hydronephrosis and hydroureter extending down to a 0.7 cm right distal ureteral stone about 8 mm proximal to the right UVJ. Punctate 1 to 2 mm right kidney lower pole nonobstructive renal calculus. 5 nonobstructive left renal calculus, largest in the mid kidney measuring 7 mm in long axis. No perinephric or periureteral stranding. Urinary bladder is unremarkable. GI AND BOWEL: Stomach demonstrates no acute abnormality. There is no bowel obstruction. PERITONEUM AND RETROPERITONEUM: No ascites. No free air. VASCULATURE: Aorta is normal in caliber. LYMPH NODES: No lymphadenopathy. REPRODUCTIVE ORGANS: No acute abnormality. BONES AND SOFT TISSUES: Left paracentral disc osteophyte complex at T11-T12, without definite impingement. No acute osseous abnormality. No focal soft tissue abnormality. IMPRESSION: 1. Mild to moderate right hydronephrosis and hydroureter secondary to a 0.7 cm right distal ureteral stone approximately 8 mm proximal to the right UVJ. 2. Punctate 12 mm nonobstructive right lower pole renal calculus. 3. Multiple nonobstructive left renal calculi, up to 7 mm in the mid kidney. 4. Hepatic steatosis. Electronically signed by: Ryan Salvage MD 05/03/2024 10:52 AM EST RP Workstation: [id number]   US  PELVIC COMPLETE W TRANSVAGINAL AND TORSION R/O Result Date: 05/03/2024 EXAM: US  Pelvis, Complete Transvaginal and Transabdominal with Doppler 05/03/2024 10:00:00 AM  TECHNIQUE: Transabdominal and transvaginal pelvic duplex ultrasound using B-mode/gray scaled imaging with Doppler spectral analysis and color flow was obtained. COMPARISON: None available CLINICAL HISTORY: Pelvic pain, right upper quadrant pain. FINDINGS: UTERUS: Uterus measures 10.3 x 4.9 x 4.9 cm (128 cc). Uterus demonstrates normal myometrial echotexture. ENDOMETRIAL STRIPE: Endometrial measures 0.9 cm. Endometrial stripe is within normal limits. RIGHT OVARY: Right ovary measures 4.0 x 3.1 x 3.1 cm (volume = 20 cm\S\3). Right ovary is within normal limits. There is normal arterial and venous Doppler flow. LEFT OVARY: Left ovary not visualized. FREE FLUID: No free fluid. IMPRESSION: 1. No significant abnormality to explain pelvic or right upper quadrant pain. 2. The left ovary was not visualized transabdominally or transvaginally. Electronically signed by: Ryan Salvage MD 05/03/2024 10:33 AM EST RP Workstation: [id number]     Procedures   Medications Ordered in the ED  HYDROmorphone  (DILAUDID ) injection 0.5 mg (has no administration in time range)  ketorolac  (TORADOL ) 15 MG/ML injection 15 mg (15 mg Intravenous Given 05/03/24  1808)  ondansetron  (ZOFRAN ) injection 4 mg (4 mg Intravenous Given 05/03/24 1807)                                    Medical Decision Making Differential diagnosis includes but not limited to ureteral colic, UTI, pyelonephritis  Course: Patient presents with continued renal colic, not controlled by her home meds.  She was given Toradol  here without relief, simply given Dilaudid  with minimal relief and then morphine  which much more relief.  Initially given Zofran  with minimal relief and then given droperidol .  Patient on reevaluation is now very comfortable and asymptomatic.  She feels comfortable going home is good to follow-up with urology.  She has established care with Dr. Sherrilee.  Discussed with increased dosing of the oxycodone .  We discussed risks of  unintentional overdose and she is going to take care to not take it too frequently.  I have prescribed oxycodone  without acetaminophen  so she can also take acetaminophen  and ibuprofen .  Amount and/or Complexity of Data Reviewed Labs: ordered.  Risk OTC drugs. Prescription drug management.        Final diagnoses:  None    ED Discharge Orders     None          Suellen Sherran LABOR, PA-C 05/03/24 2132    Suellen Sherran LABOR DEVONNA 05/03/24 2133    Cleotilde Rogue, MD 05/03/24 2155

## 2024-05-03 NOTE — ED Notes (Signed)
 Patient called out for pain meds. Pain meds requested.

## 2024-05-03 NOTE — Discharge Instructions (Addendum)
 Thank you for letting us  be a part of your care.  You came in with right-sided abdominal pain, your imaging revealed you have a 7 mm right sided kidney stone.  We will send you home with nausea medicine, pain medicine and a medicine that will help you pass the stone.  Please follow-up with your urologist after the holidays on Monday December 1st.   Please come back to the ED if you have fevers, chills, an increase in your pain, nausea, vomiting or start to feel ill.

## 2024-05-03 NOTE — ED Notes (Signed)
 Patient transported to Ultrasound

## 2024-05-03 NOTE — Discharge Instructions (Addendum)
 It was a pleasure taking care of you today.  You were seen in the ER for kidney stone pain.  You are given IV pain medicine here and nausea medicine.    Since your pain is significantly improved ,and you have no sign of infection in your urine we are to sending you home to follow-up with urology.  Come back if you have new or worsening symptoms.  Follow-up with urology.  I am increasing your pain medication dose from 5 mg of oxycodone  to 10 mg.  Be careful as this can make you sleepy here.  Do not take it with alcohol or other sedating medicines.  Do not take it more often than prescribed as it possible to accidentally overdose.  Not drive or operate machinery when taking this medication.  Can also cause constipation.  Take MiraLAX daily while taking this and make sure to drink plenty of water.  You also take over-the-counter Tylenol  when taking this medication as it does not have Tylenol  in it.  You can also take the 600 mg ibuprofen .  Prescribed the Zofran  ODT to use as needed for nausea and promethazine  to use if this is not helping.

## 2024-05-03 NOTE — ED Triage Notes (Signed)
 Pt reports RLQ abd pain starting today on way to work. 1 episode of emesis that made pain worse. Hx of gall bladder removal. Pain started as dull and now sharp. Endorses some pain with urination.

## 2024-05-03 NOTE — ED Provider Notes (Signed)
 [place] EMERGENCY DEPARTMENT AT [facility] Provider Note   CSN: [id number] Arrival date & time: 05/03/24  9263     Patient presents with: Abdominal Pain   Rebecca Gardner is a 28 y.o. female.    Abdominal Pain  Past medical history of anxiety, depression, hypertension, obesity, hx of kidney stones.  Patient presents today with acute abdominal pain.  She was on her way to work and her right lower quadrant started having pain and she came to the ED.  Up until today she has been in her normal state of health, denies any recent illness or changes in medication.  She says the pain has gotten progressively worse.  It is tender to palpation of the right lower quadrant and she just peed while in the ED and it hurt to pee.  She did not notice any blood but she did feel like the pain radiated to her vagina.  She has had kidney stones in the past, this episode feels similar to that but worse.  She thinks she may have passed a stone a few weeks ago.  She does not have pain anywhere else, denies numbness or tingling in her toes, hands and denies chest pain.  Her gallbladder was removed years ago and she says there is no chance that she is pregnant.    Prior to Admission medications   Medication Sig Start Date End Date Taking? Authorizing Provider  hydrOXYzine  (VISTARIL ) 25 MG capsule TAKE 1 CAPSULE (25 MG TOTAL) BY MOUTH EVERY 8 (EIGHT) HOURS AS NEEDED. Patient taking differently: Take 25 mg by mouth every 8 (eight) hours as needed for anxiety. 03/20/24  Yes Hawks, Christy A, FNP  ondansetron  (ZOFRAN ) 4 MG tablet Take 1 tablet (4 mg total) by mouth every 8 (eight) hours as needed for up to 3 days for nausea or vomiting. 05/03/24 05/06/24 Yes Shannel Zahm, Viktoria, DO  oxyCODONE -acetaminophen  (PERCOCET/ROXICET) 5-325 MG tablet Take 1 tablet by mouth every 6 (six) hours as needed for up to 3 days for severe pain (pain score 7-10). 05/03/24 05/06/24 Yes Rolene Andrades, Viktoria, DO  tamsulosin  (FLOMAX )  0.4 MG CAPS capsule Take 1 capsule (0.4 mg total) by mouth daily for 14 days. 05/03/24 05/17/24 Yes Charmayne Viktoria, DO    Allergies: Patient has no known allergies.    Review of Systems  Gastrointestinal:  Positive for abdominal pain.    Updated Vital Signs BP (!) 148/97   Pulse 96   Temp 97.8 F (36.6 C)   Resp 19   Ht 5' 8 (1.727 m)   Wt (!) 164 kg   LMP 04/18/2024 (Approximate)   SpO2 97%   BMI 54.97 kg/m   Physical Exam Vitals reviewed.  Constitutional:      Appearance: She is obese.     Comments: Restlessly laying in bed, appears to be in pain  Eyes:     Extraocular Movements: Extraocular movements intact.  Cardiovascular:     Rate and Rhythm: Regular rhythm. Tachycardia present.  Pulmonary:     Breath sounds: Normal breath sounds.     Comments: Shallow breathing Abdominal:     General: Bowel sounds are normal.     Palpations: Abdomen is soft.     Tenderness: There is abdominal tenderness in the right lower quadrant. There is right CVA tenderness.     Hernia: No hernia is present.  Skin:    General: Skin is warm.  Neurological:     General: No focal deficit present.  Mental Status: She is oriented to person, place, and time.  Psychiatric:        Behavior: Behavior normal.     (all labs ordered are listed, but only abnormal results are displayed) Labs Reviewed  CBC WITH DIFFERENTIAL/PLATELET - Abnormal; Notable for the following components:      Result Value   WBC 11.5 (*)    RBC 5.18 (*)    All other components within normal limits  COMPREHENSIVE METABOLIC PANEL WITH GFR - Abnormal; Notable for the following components:   CO2 21 (*)    Glucose, Bld 113 (*)    All other components within normal limits  URINALYSIS, ROUTINE W REFLEX MICROSCOPIC - Abnormal; Notable for the following components:   APPearance HAZY (*)    Hgb urine dipstick LARGE (*)    Leukocytes,Ua TRACE (*)    Bacteria, UA FEW (*)    All other components within normal limits   URINE CULTURE  HCG, SERUM, QUALITATIVE  LIPASE, BLOOD  POC URINE PREG, ED    EKG: None  Radiology: CT ABDOMEN PELVIS WO CONTRAST Result Date: 05/03/2024 EXAM: CT ABDOMEN AND PELVIS WITHOUT CONTRAST 05/03/2024 10:07:14 AM TECHNIQUE: CT of the abdomen and pelvis was performed without the administration of intravenous contrast. Multiplanar reformatted images are provided for review. Automated exposure control, iterative reconstruction, and/or weight-based adjustment of the mA/kV was utilized to reduce the radiation dose to as low as reasonably achievable. COMPARISON: 09/05/2023 CLINICAL HISTORY: Right lower quadrant abdominal pain and dysuria. FINDINGS: LOWER CHEST: No acute abnormality. LIVER: Diffuse hepatic steatosis with suspected fatty sparing posteriorly in segment 4. GALLBLADDER AND BILE DUCTS: Gallbladder is unremarkable. No biliary ductal dilatation. SPLEEN: No acute abnormality. PANCREAS: No acute abnormality. ADRENAL GLANDS: No acute abnormality. KIDNEYS, URETERS AND BLADDER: Mild to moderate right hydronephrosis and hydroureter extending down to a 0.7 cm right distal ureteral stone about 8 mm proximal to the right UVJ. Punctate 1 to 2 mm right kidney lower pole nonobstructive renal calculus. 5 nonobstructive left renal calculus, largest in the mid kidney measuring 7 mm in long axis. No perinephric or periureteral stranding. Urinary bladder is unremarkable. GI AND BOWEL: Stomach demonstrates no acute abnormality. There is no bowel obstruction. PERITONEUM AND RETROPERITONEUM: No ascites. No free air. VASCULATURE: Aorta is normal in caliber. LYMPH NODES: No lymphadenopathy. REPRODUCTIVE ORGANS: No acute abnormality. BONES AND SOFT TISSUES: Left paracentral disc osteophyte complex at T11-T12, without definite impingement. No acute osseous abnormality. No focal soft tissue abnormality. IMPRESSION: 1. Mild to moderate right hydronephrosis and hydroureter secondary to a 0.7 cm right distal ureteral  stone approximately 8 mm proximal to the right UVJ. 2. Punctate 12 mm nonobstructive right lower pole renal calculus. 3. Multiple nonobstructive left renal calculi, up to 7 mm in the mid kidney. 4. Hepatic steatosis. Electronically signed by: Ryan Salvage MD 05/03/2024 10:52 AM EST RP Workstation: [id number]   US  PELVIC COMPLETE W TRANSVAGINAL AND TORSION R/O Result Date: 05/03/2024 EXAM: US  Pelvis, Complete Transvaginal and Transabdominal with Doppler 05/03/2024 10:00:00 AM TECHNIQUE: Transabdominal and transvaginal pelvic duplex ultrasound using B-mode/gray scaled imaging with Doppler spectral analysis and color flow was obtained. COMPARISON: None available CLINICAL HISTORY: Pelvic pain, right upper quadrant pain. FINDINGS: UTERUS: Uterus measures 10.3 x 4.9 x 4.9 cm (128 cc). Uterus demonstrates normal myometrial echotexture. ENDOMETRIAL STRIPE: Endometrial measures 0.9 cm. Endometrial stripe is within normal limits. RIGHT OVARY: Right ovary measures 4.0 x 3.1 x 3.1 cm (volume = 20 cm\S\3). Right ovary is within normal limits. There is normal  arterial and venous Doppler flow. LEFT OVARY: Left ovary not visualized. FREE FLUID: No free fluid. IMPRESSION: 1. No significant abnormality to explain pelvic or right upper quadrant pain. 2. The left ovary was not visualized transabdominally or transvaginally. Electronically signed by: Ryan Salvage MD 05/03/2024 10:33 AM EST RP Workstation: [id number]     Procedures   Medications Ordered in the ED  morphine  (PF) 4 MG/ML injection 4 mg (4 mg Intravenous Given 05/03/24 0818)  ondansetron  (ZOFRAN ) injection 4 mg (4 mg Intravenous Given 05/03/24 0818)  lactated ringers  bolus 1,000 mL (0 mLs Intravenous Stopped 05/03/24 1019)  morphine  (PF) 4 MG/ML injection 4 mg (4 mg Intravenous Given 05/03/24 1018)                                    Medical Decision Making Amount and/or Complexity of Data Reviewed Labs: ordered. Radiology:  ordered.  Risk Prescription drug management.   This patient presents to the ED for concern of acute right sided abdominal pain, this involves an extensive number of treatment options, and is a complaint that carries with it a high risk of complications and morbidity.  The differential diagnosis includes nephrolithiasis, pyelonephritis, ovarian torsion, appendicitis, ectopic pregnancy, ovarian cyst, diverticulitis, less likely colonic perforation, intussusception, post cholecystectomy pain, pancreatitis, mesenteric ischemia, splenic infarct   Co morbidities / Chronic conditions that complicate the patient evaluation  Obesity, HTN, history of kidney stones   Additional history obtained:  Additional history obtained from EMR External records from outside source obtained and reviewed including ED visit from March of this year where she was diagnosed with a 5 mm kidney stone, she was treated with pain medicine and passed her p.o. challenge with discharge home and outpatient urology follow-up.  She did follow-up with urology in June.   Lab Tests:  I Ordered, and personally interpreted labs.  The pertinent results include: WBC 11.5, urine and serum pregnancy negative, CO2 21 UA hazy appearance, hemoglobin large, leukocytes trace, bacteria few   Imaging Studies ordered:  I ordered imaging studies including CT abdomen pelvis without, US  pelvic complete I independently visualized and interpreted imaging which showed  US : no abnormality to explain right lower quadrant pain CT: Right hydronephrosis, hydroureter with approximately 8 mm stone proximal to the right UVJ I agree with the radiologist interpretation   Cardiac Monitoring: / EKG:  Patient was not maintained on cardiac monitor  Problem List / ED Course / Critical interventions / Medication management  Right ureterolithiasis with hydro nephrosis and hydroureter I ordered medication including morphine  and ondansetron  and  LR. Reevaluation of the patient after these medicines showed that the patient has had great improvement in her pain and nausea. She is now accompanied by her mother and a friend from work. Afebrile and UA negative for nitrates.  CT not concerning for pyelonephritis.  Low concern for infection at this time. Discussed the plan to send her home with pain meds, nausea meds and expulsion medication.  Discussed reasons to return to the ED such as fever and chills.  Patient and mother verbalized understanding. I have reviewed the patients home medicines and have made adjustments as needed   Consultations Obtained:  I requested consultation with the urologist,  and discussed lab and imaging findings as well as pertinent plan - they recommend: Medical expulsion therapy and urology follow-up on Monday, December 1.   Social Determinants of Health:  Family support   Test /  Admission - Considered:  Low concern for infection at this time, pain is well-controlled with medications, admission not necessary.      Final diagnoses:  Nephrolithiasis    ED Discharge Orders          Ordered    ondansetron  (ZOFRAN ) 4 MG tablet  Every 8 hours PRN        05/03/24 1127    oxyCODONE -acetaminophen  (PERCOCET/ROXICET) 5-325 MG tablet  Every 6 hours PRN        05/03/24 1127    tamsulosin  (FLOMAX ) 0.4 MG CAPS capsule  Daily        05/03/24 1127               Yoltzin Ransom, Viktoria, DO 05/03/24 1151    Lowther, Amy, DO 05/05/24 1907

## 2024-05-04 LAB — URINE CULTURE: Culture: <10000 — AB

## 2024-05-26 ENCOUNTER — Other Ambulatory Visit: Payer: Self-pay

## 2024-05-26 ENCOUNTER — Emergency Department (HOSPITAL_COMMUNITY)

## 2024-05-26 ENCOUNTER — Emergency Department (HOSPITAL_COMMUNITY)
Admission: EM | Admit: 2024-05-26 | Discharge: 2024-05-26 | Disposition: A | Discharge status: Home / Self Care | Attending: Emergency Medicine | Admitting: Emergency Medicine

## 2024-05-26 DIAGNOSIS — M25571 Pain in right ankle and joints of right foot: Secondary | ICD-10-CM | POA: Insufficient documentation

## 2024-05-26 DIAGNOSIS — I10 Essential (primary) hypertension: Secondary | ICD-10-CM | POA: Insufficient documentation

## 2024-05-26 DIAGNOSIS — M7989 Other specified soft tissue disorders: Secondary | ICD-10-CM | POA: Diagnosis not present

## 2024-05-26 MED ORDER — ONDANSETRON HCL 4 MG/2ML IJ SOLN
4.0000 mg | Freq: Once | INTRAMUSCULAR | Status: AC
  Administered 2024-05-26: 4 mg via INTRAVENOUS
  Filled 2024-05-26: qty 2

## 2024-05-26 MED ORDER — MORPHINE SULFATE (PF) 4 MG/ML IV SOLN
4.0000 mg | Freq: Once | INTRAVENOUS | Status: AC
  Administered 2024-05-26: 4 mg via INTRAVENOUS
  Filled 2024-05-26: qty 1

## 2024-05-26 MED ORDER — KETOROLAC TROMETHAMINE 15 MG/ML IJ SOLN
15.0000 mg | Freq: Once | INTRAMUSCULAR | Status: AC
  Administered 2024-05-26: 15 mg via INTRAVENOUS
  Filled 2024-05-26: qty 1

## 2024-05-26 NOTE — ED Triage Notes (Signed)
 Pt arrived [facility] From missing a step on the bleachers and twisted her ankle. Pt right ankle noted to have deformity.  20 G in RAC; 100 mcg of fentanyl  given at 1830.

## 2024-05-26 NOTE — Discharge Instructions (Signed)
 Please take Tylenol (acetaminophen) and Motrin (ibuprofen) for your symptoms at home.  You can take 1000 mg of Tylenol every 6 hours and 600 mg of Motrin every 6 hours as needed for your symptoms.  You can take these medicines together as needed, either at the same time, or alternating every 3 hours.

## 2024-05-26 NOTE — ED Notes (Addendum)
 Pt was not able to tolerate CAM boot. This RN made MD Scheving aware and he endorsed the pts ankle being wrapped in an ACE bandage. Pt was given crutches with education on how to use them.

## 2024-05-26 NOTE — ED Notes (Signed)
 Right ankle w/ signs of obvious deformity, swelling, and bruising. CMS intact distal to affected area. Injury is isolated.

## 2024-05-26 NOTE — ED Notes (Signed)
 Pillow in place for immobilization of right foot/ankle.

## 2024-05-26 NOTE — ED Provider Notes (Signed)
 " [place] EMERGENCY DEPARTMENT AT [facility] Provider Note  CSN: [id number] Arrival date & time: 05/26/24 1839  Chief Complaint(s) Ankle Pain  HPI Rebecca Gardner is a 28 y.o. female without relevant past medical history presenting to the emergency department with ankle injury.  Patient missed a step on the bleachers and twisted her right ankle.  Reports pain, swelling.  She reports that she was only able to put a slight amount of weight on it.  Denies other injury, head injury.  Denies pain elsewhere.  No wound.   Past Medical History Past Medical History:  Diagnosis Date   Acute lateral meniscus tear of left knee 02/02/2014   Acute medial meniscus tear of left knee    Concussion with brief (less than one hour) loss of consciousness 4-28 15   Depression    Gall stones 03/27/2016   Hypertension    Nephrolithiasis    Patient Active Problem List   Diagnosis Date Noted   Abdominal pain 05/08/2022   Hypokalemia 05/08/2022   Migraine 05/05/2022   Psoriasis 09/12/2020   GAD (generalized anxiety disorder) 07/18/2020   Depression, major, single episode, mild 07/18/2020   Supraventricular tachycardia during pregnancy 07/05/2019   Benign hypertension 05/22/2019   Right ovarian cyst 02/20/2019   Papilledema 02/05/2016   Cephalalgia 02/05/2016   Morbid obesity (HCC) 07/30/2015   Home Medication(s) Prior to Admission medications  Medication Sig Start Date End Date Taking? Authorizing Provider  hydrOXYzine  (VISTARIL ) 25 MG capsule TAKE 1 CAPSULE (25 MG TOTAL) BY MOUTH EVERY 8 (EIGHT) HOURS AS NEEDED. Patient taking differently: Take 25 mg by mouth every 8 (eight) hours as needed for anxiety. 03/20/24   Lavell Bari DELENA, FNP  ibuprofen  (ADVIL ) 600 MG tablet Take 1 tablet (600 mg total) by mouth every 6 (six) hours as needed. 05/03/24   Suellen Cantor A, PA-C  ondansetron  (ZOFRAN -ODT) 4 MG disintegrating tablet Take 1 tablet (4 mg total) by mouth every 6 (six) hours as needed  for nausea or vomiting. 05/03/24   Suellen Cantor A, PA-C  oxyCODONE  10 MG TABS Take 1 tablet (10 mg total) by mouth every 6 (six) hours as needed for severe pain (pain score 7-10). 05/03/24   Suellen Cantor A, PA-C  promethazine  (PHENERGAN ) 12.5 MG tablet Take 1 tablet (12.5 mg total) by mouth every 8 (eight) hours as needed for nausea or vomiting. 05/03/24   Suellen Cantor DELENA, PA-C                                                                                                                                    Past Surgical History Past Surgical History:  Procedure Laterality Date   CHOLECYSTECTOMY N/A 04/10/2016   Procedure: LAPAROSCOPIC CHOLECYSTECTOMY;  Surgeon: Herlene Beverley Bureau, MD;  Location: [facility] [place];  Service: General;  Laterality: N/A;   KNEE ARTHROSCOPY WITH LATERAL MENISECTOMY Left 02/02/2014   Procedure: LEFT KNEE ARTHROSCOPY WITH LATERAL  MENISECTOMY;  Surgeon: Fonda SHAUNNA Olmsted, MD;  Location: [facility];  Service: Orthopedics;  Laterality: Left;   TONSILLECTOMY     Family History Family History  Problem Relation Age of Onset   Breast cancer Paternal Grandmother    Healthy Mother    Atrial fibrillation Father    Prostate cancer Paternal Grandfather    Cancer Paternal Grandfather        liver   Migraines Maternal Uncle    COPD Maternal Uncle    Jaundice Daughter     Social History Social History[1] Allergies Patient has no known allergies.  Review of Systems Review of Systems  All other systems reviewed and are negative.   Physical Exam Vital Signs  I have reviewed the triage vital signs BP 136/83   Pulse 96   Temp 98.2 F (36.8 C) (Oral)   Resp 16   Ht 5' 8 (1.727 m)   Wt (!) 164 kg   LMP 04/18/2024 (Approximate)   SpO2 100%   BMI 54.97 kg/m  Physical Exam Vitals and nursing note reviewed.  Constitutional:      Appearance: Normal appearance. She is obese.  HENT:     Head: Normocephalic and atraumatic.     Mouth/Throat:     Mouth:  Mucous membranes are moist.  Eyes:     Conjunctiva/sclera: Conjunctivae normal.  Cardiovascular:     Rate and Rhythm: Normal rate.  Pulmonary:     Effort: Pulmonary effort is normal. No respiratory distress.  Abdominal:     General: Abdomen is flat.  Musculoskeletal:     Comments: Soft tissue swelling around the right ankle with surrounding tenderness.  No obvious deformity.  No tenderness to the midfoot or toes or to the proximal right lower leg.  2+ DP pulse  Skin:    General: Skin is warm and dry.     Capillary Refill: Capillary refill takes less than 2 seconds.  Neurological:     General: No focal deficit present.     Mental Status: She is alert. Mental status is at baseline.  Psychiatric:        Mood and Affect: Mood normal.        Behavior: Behavior normal.     ED Results and Treatments Labs (all labs ordered are listed, but only abnormal results are displayed) Labs Reviewed - No data to display                                                                                                                        Radiology DG Ankle Right Port Result Date: 05/26/2024 EXAM: 1 OR 2 VIEW(S) XRAY OF THE ANKLE 05/26/2024 07:12:25 PM CLINICAL HISTORY: 809823 Fall 809823 COMPARISON: None available. FINDINGS: BONES AND JOINTS: No acute fracture. No malalignment. SOFT TISSUES: Soft tissue swelling at the ankle. IMPRESSION: 1. Soft tissue swelling at the ankle without acute fracture. Electronically signed by: Norman Gatlin MD 05/26/2024 07:35 PM EST RP Workstation: [id number]    Pertinent  labs & imaging results that were available during my care of the patient were reviewed by me and considered in my medical decision making (see MDM for details).  Medications Ordered in ED Medications  ondansetron  (ZOFRAN ) injection 4 mg (4 mg Intravenous Given 05/26/24 1906)  morphine  (PF) 4 MG/ML injection 4 mg (4 mg Intravenous Given 05/26/24 1906)  ketorolac  (TORADOL ) 15 MG/ML injection 15 mg  (15 mg Intravenous Given 05/26/24 2025)                                                                                                                                     Procedures Procedures  (including critical care time)  Medical Decision Making / ED Course   MDM:  28 year old presenting to the emergency department with ankle injury.  Differential includes fracture, sprain, dislocation.  Low concern for other injury such as foot injury or knee injury.  Will give pain control, obtain x-ray and reassess.  Clinical Course as of 05/26/24 2042  Fri May 26, 2024  2042 X-ray negative. Doubt occult fracture. Patient will be given walking boot and crutches.  Recommended outpatient follow-up. Will discharge patient to home. All questions answered. Patient comfortable with plan of discharge. Return precautions discussed with patient and specified on the after visit summary.  [WS]    Clinical Course User Index [WS] Francesca Elsie CROME, MD     Additional history obtained: -Additional history obtained from ems and spouse -External records from outside source obtained and reviewed including: Chart review including previous notes, labs, imaging, consultation notes including prior notes     Imaging Studies ordered: I ordered imaging studies including XR ankle On my interpretation imaging demonstrates no fracture/dislocation I independently visualized and interpreted imaging. I agree with the radiologist interpretation   Medicines ordered and prescription drug management: Meds ordered this encounter  Medications   ondansetron  (ZOFRAN ) injection 4 mg   morphine  (PF) 4 MG/ML injection 4 mg   ketorolac  (TORADOL ) 15 MG/ML injection 15 mg    -I have reviewed the patients home medicines and have made adjustments as needed  Social Determinants of Health:  Diagnosis or treatment significantly limited by social determinants of health: obesity   Reevaluation: After the interventions  noted above, I reevaluated the patient and found that their symptoms have improved  Co morbidities that complicate the patient evaluation  Past Medical History:  Diagnosis Date   Acute lateral meniscus tear of left knee 02/02/2014   Acute medial meniscus tear of left knee    Concussion with brief (less than one hour) loss of consciousness 4-28 15   Depression    Gall stones 03/27/2016   Hypertension    Nephrolithiasis       Dispostion: Disposition decision including need for hospitalization was considered, and patient discharged from emergency department.    Final Clinical Impression(s) / ED Diagnoses Final diagnoses:  Acute right ankle pain     This  chart was dictated using voice recognition software.  Despite best efforts to proofread,  errors can occur which can change the documentation meaning.     [1]  Social History Tobacco Use   Smoking status: Never   Smokeless tobacco: Never  Vaping Use   Vaping status: Never Used  Substance Use Topics   Alcohol use: No   Drug use: No     Francesca Elsie CROME, MD 05/26/24 2042  "

## 2024-05-26 NOTE — ED Notes (Signed)
 Patient called for pain medication. RN checked patient just recently received pain medication

## 2024-05-26 NOTE — ED Notes (Signed)
 ED tech wrapped pts right ankle. Pt was able to tolerate ankle being ACE wrapped.

## 2024-06-02 ENCOUNTER — Ambulatory Visit: Payer: Self-pay

## 2024-06-02 NOTE — Telephone Encounter (Signed)
 Pt scheduled with DOD on Tuesday 12/30 at 3:50.

## 2024-06-02 NOTE — Telephone Encounter (Signed)
 FYI Only or Action Required?: Action required by provider: refusing ED, needs call back, alerted [facility] to ED refusal.  Patient was last seen in primary care on 01/28/2024 by Dettinger, Fonda LABOR, MD.  Called Nurse Triage reporting Foot Pain and Foot Injury.  Symptoms began a week ago.  Interventions attempted: Other: ED a week ago.  Symptoms are: unchanged.  Triage Disposition: Go to ED Now (Notify PCP)  Patient/caregiver understands and will follow disposition?: No, refuses disposition       Copied from CRM [id number]. Topic: Clinical - Red Word Triage >> Jun 02, 2024  9:47 AM Larissa RAMAN wrote: [facility] that prompted transfer to Nurse Triage: RT foot pain- worsening Reason for Disposition  Can't stand (bear weight) or walk  Answer Assessment - Initial Assessment Questions This RN recommended that pt go back to ED for evaluation since unable to bear any weight and pt feels they missed her getting a fracture in her foot. Pt refusing, this RN recommended that pt go to UC at least in meantime to ensure evaluated if refusing ED, but sending message to PCP office for her to receive call back with next steps.    Last Friday 12/19 had to go to hospital because thought broke but x-ray showed just bad sprain, probably wouldn't be able to bear weight for about a week Been a week and legitimately feels broke Don't know if too much swelling in foot when x-rayed originally When sitting with foot elevated 4/10 pain When put any type of weight on it 10/10 Fell off the bleachers at basketball game Heard pop/click Foot is yellow and black, lots of bruising Swollen, not able to put shoe on it Can move my ankle and wiggle my toes, painful to move ankle Part that feels broke is the top of my foot Cannot bear any weight on it at all Looks like the whole foot curving a little towards the left, have a knot on the right side of foot Don't want to go back to the ER  Protocols used: Foot  Injury-A-AH

## 2024-06-06 ENCOUNTER — Ambulatory Visit: Admitting: Nurse Practitioner

## 2024-06-06 ENCOUNTER — Ambulatory Visit

## 2024-06-06 ENCOUNTER — Encounter: Payer: Self-pay | Admitting: Nurse Practitioner

## 2024-06-06 VITALS — BP 131/83 | HR 105 | Temp 98.0°F | Ht 68.0 in | Wt 361.0 lb

## 2024-06-06 DIAGNOSIS — M25571 Pain in right ankle and joints of right foot: Secondary | ICD-10-CM | POA: Diagnosis not present

## 2024-06-06 DIAGNOSIS — S92351A Displaced fracture of fifth metatarsal bone, right foot, initial encounter for closed fracture: Secondary | ICD-10-CM | POA: Diagnosis not present

## 2024-06-06 DIAGNOSIS — M79671 Pain in right foot: Secondary | ICD-10-CM

## 2024-06-06 NOTE — Progress Notes (Signed)
" ° °  Subjective:    Patient ID: Rebecca Gardner, female    DOB: [birth year], 28 y.o.   MRN: [id number]   Chief Complaint: Ankle Pain (Right ankle. Seen at ER on Dec 19th after a fall and they said it was not broken, Pain and swelling is worse)   Ankle Pain    Patient fell on December 19th and went to ED. They did xray but was so swollen they could not see anything. Has been wearing a fracture shoe and is  no better.  Patient Active Problem List   Diagnosis Date Noted   Abdominal pain 05/08/2022   Hypokalemia 05/08/2022   Migraine 05/05/2022   Psoriasis 09/12/2020   GAD (generalized anxiety disorder) 07/18/2020   Depression, major, single episode, mild 07/18/2020   Supraventricular tachycardia during pregnancy 07/05/2019   Benign hypertension 05/22/2019   Right ovarian cyst 02/20/2019   Papilledema 02/05/2016   Cephalalgia 02/05/2016   Morbid obesity (HCC) 07/30/2015       Review of Systems  Constitutional:  Negative for diaphoresis.  Eyes:  Negative for pain.  Respiratory:  Negative for shortness of breath.   Cardiovascular:  Negative for chest pain, palpitations and leg swelling.  Gastrointestinal:  Negative for abdominal pain.  Endocrine: Negative for polydipsia.  Skin:  Negative for rash.  Neurological:  Negative for dizziness, weakness and headaches.  Hematological:  Does not bruise/bleed easily.  All other systems reviewed and are negative.      Objective:   Physical Exam Constitutional:      Appearance: Normal appearance.  Cardiovascular:     Rate and Rhythm: Normal rate and regular rhythm.     Heart sounds: Normal heart sounds.  Pulmonary:     Effort: Pulmonary effort is normal.     Breath sounds: Normal breath sounds.  Neurological:     General: No focal deficit present.     Mental Status: She is alert and oriented to person, place, and time.  Psychiatric:        Mood and Affect: Mood normal.        Behavior: Behavior normal.    BP 131/83   Pulse  (!) 105   Temp 98 F (36.7 C) (Temporal)   Ht 5' 8 (1.727 m)   Wt (!) 361 lb (163.7 kg)   SpO2 94%   BMI 54.89 kg/m    Displaced 5th metatarsal fracture right foot-Preliminary reading by Ronal Lunger, FNP  [facility]        Assessment & Plan:   Rebecca Gardner in today with chief complaint of Ankle Pain (Right ankle. Seen at ER on Dec 19th after a fall and they said it was not broken, Pain and swelling is worse)   1. Acute right ankle pain (Primary)  - DG Ankle Complete Right  2. Right foot pain  - DG Foot Complete Right - Ambulatory referral to Orthopedic Surgery  3. Displaced fracture of fifth metatarsal bone, right foot, initial encounter for closed fracture Ice Stay off of it Elevate Stat referral to ortho    The above assessment and management plan was discussed with the patient. The patient verbalized understanding of and has agreed to the management plan. Patient is aware to call the clinic if symptoms persist or worsen. Patient is aware when to return to the clinic for a follow-up visit. Patient educated on when it is appropriate to go to the emergency department.   Rebecca Lunger, FNP    "
